# Patient Record
Sex: Male | Born: 1965 | Race: White | Hispanic: No | Marital: Single | Smoking: Never smoker
Health system: Southern US, Community
[De-identification: ages and names within clinical notes are randomized; demographics above are authoritative.]

## PROBLEM LIST (undated history)

## (undated) DIAGNOSIS — E278 Other specified disorders of adrenal gland: Secondary | ICD-10-CM

## (undated) DIAGNOSIS — I10 Essential (primary) hypertension: Secondary | ICD-10-CM

## (undated) DIAGNOSIS — Z87448 Personal history of other diseases of urinary system: Secondary | ICD-10-CM

## (undated) DIAGNOSIS — F32A Depression, unspecified: Secondary | ICD-10-CM

## (undated) DIAGNOSIS — N400 Enlarged prostate without lower urinary tract symptoms: Secondary | ICD-10-CM

## (undated) DIAGNOSIS — T7840XA Allergy, unspecified, initial encounter: Secondary | ICD-10-CM

## (undated) DIAGNOSIS — M109 Gout, unspecified: Secondary | ICD-10-CM

## (undated) DIAGNOSIS — F419 Anxiety disorder, unspecified: Secondary | ICD-10-CM

## (undated) DIAGNOSIS — F329 Major depressive disorder, single episode, unspecified: Secondary | ICD-10-CM

## (undated) DIAGNOSIS — R351 Nocturia: Secondary | ICD-10-CM

## (undated) DIAGNOSIS — E785 Hyperlipidemia, unspecified: Secondary | ICD-10-CM

## (undated) DIAGNOSIS — F1010 Alcohol abuse, uncomplicated: Secondary | ICD-10-CM

## (undated) DIAGNOSIS — K7460 Unspecified cirrhosis of liver: Secondary | ICD-10-CM

## (undated) DIAGNOSIS — D759 Disease of blood and blood-forming organs, unspecified: Secondary | ICD-10-CM

## (undated) HISTORY — DX: Allergy, unspecified, initial encounter: T78.40XA

## (undated) HISTORY — PX: EYE SURGERY: SHX253

## (undated) HISTORY — PX: OTHER SURGICAL HISTORY: SHX169

---
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
## 1987-06-21 HISTORY — PX: KNEE SURGERY: SHX244

## 2003-12-04 ENCOUNTER — Emergency Department (HOSPITAL_COMMUNITY): Admission: EM | Admit: 2003-12-04 | Discharge: 2003-12-04 | Payer: Self-pay | Admitting: Emergency Medicine

## 2007-07-11 ENCOUNTER — Ambulatory Visit: Payer: Self-pay | Admitting: Cardiology

## 2007-08-15 ENCOUNTER — Ambulatory Visit: Payer: Self-pay | Admitting: Cardiology

## 2007-10-15 ENCOUNTER — Ambulatory Visit: Payer: Self-pay | Admitting: Cardiology

## 2008-03-20 ENCOUNTER — Ambulatory Visit (HOSPITAL_COMMUNITY): Payer: Self-pay | Admitting: Psychology

## 2008-04-11 ENCOUNTER — Ambulatory Visit (HOSPITAL_COMMUNITY): Payer: Self-pay | Admitting: Psychology

## 2008-04-25 ENCOUNTER — Ambulatory Visit (HOSPITAL_COMMUNITY): Payer: Self-pay | Admitting: Psychology

## 2008-06-17 ENCOUNTER — Ambulatory Visit (HOSPITAL_COMMUNITY): Payer: Self-pay | Admitting: Psychology

## 2008-07-18 ENCOUNTER — Ambulatory Visit (HOSPITAL_COMMUNITY): Payer: Self-pay | Admitting: Psychology

## 2008-08-22 ENCOUNTER — Ambulatory Visit (HOSPITAL_COMMUNITY): Payer: Self-pay | Admitting: Psychology

## 2008-09-26 ENCOUNTER — Ambulatory Visit (HOSPITAL_COMMUNITY): Payer: Self-pay | Admitting: Psychology

## 2008-10-08 ENCOUNTER — Ambulatory Visit (HOSPITAL_COMMUNITY): Payer: Self-pay | Admitting: Psychology

## 2008-10-24 ENCOUNTER — Ambulatory Visit (HOSPITAL_COMMUNITY): Payer: Self-pay | Admitting: Psychology

## 2008-11-07 ENCOUNTER — Ambulatory Visit (HOSPITAL_COMMUNITY): Payer: Self-pay | Admitting: Psychology

## 2008-12-12 ENCOUNTER — Ambulatory Visit (HOSPITAL_COMMUNITY): Payer: Self-pay | Admitting: Psychology

## 2009-01-30 ENCOUNTER — Ambulatory Visit (HOSPITAL_COMMUNITY): Payer: Self-pay | Admitting: Psychology

## 2009-03-20 ENCOUNTER — Ambulatory Visit (HOSPITAL_COMMUNITY): Payer: Self-pay | Admitting: Psychology

## 2009-07-10 ENCOUNTER — Ambulatory Visit (HOSPITAL_COMMUNITY): Payer: Self-pay | Admitting: Psychology

## 2009-08-07 ENCOUNTER — Ambulatory Visit (HOSPITAL_COMMUNITY): Payer: Self-pay | Admitting: Psychology

## 2009-09-25 ENCOUNTER — Ambulatory Visit (HOSPITAL_COMMUNITY): Payer: Self-pay | Admitting: Psychology

## 2010-09-17 ENCOUNTER — Encounter (INDEPENDENT_AMBULATORY_CARE_PROVIDER_SITE_OTHER): Payer: BC Managed Care – PPO | Admitting: Psychology

## 2010-09-17 DIAGNOSIS — F411 Generalized anxiety disorder: Secondary | ICD-10-CM

## 2010-09-17 DIAGNOSIS — F332 Major depressive disorder, recurrent severe without psychotic features: Secondary | ICD-10-CM

## 2010-10-22 ENCOUNTER — Encounter (INDEPENDENT_AMBULATORY_CARE_PROVIDER_SITE_OTHER): Payer: BC Managed Care – PPO | Admitting: Psychology

## 2010-10-22 DIAGNOSIS — F411 Generalized anxiety disorder: Secondary | ICD-10-CM

## 2010-10-22 DIAGNOSIS — F332 Major depressive disorder, recurrent severe without psychotic features: Secondary | ICD-10-CM

## 2010-11-02 NOTE — Assessment & Plan Note (Signed)
[facility]                          [facility] OFFICE NOTE   CHIRON, CAMPIONE                      MRN:          [id number]  DATE:08/15/2007                            DOB:          05-01-1966    PRIMARY CARDIOLOGIST:  Dr. Lewayne Bunting.   REASON FOR VISIT:  Post hospital follow-up.   Mr. Loveland returns to our clinic after he was seen by [place] in consultation  here at [facility] for evaluation of symptomatic palpitations.  He presented with a prior history of such and had a previous workup in  2005, with a negative event monitor and a normal 2-D echo.   The patient also complained of some associated chest discomfort, which  we felt was quite atypical.  Serial cardiac markers were all within  normal limits.  TSH and electrolytes were normal, as well.   We ordered a 2-D echo, which once again showed normal ventricular  function and no underlying structural abnormalities.  We then prescribed  Lopressor 25 mg p.r.n. for palpitations, and recommended further  evaluation with CardioNet monitoring.   Several rhythm strips have been obtained, all revealing NSR and sinus  tachycardia with rates as high as 139 bpm.  There is no evidence of  dysrhythmia.   Clinically, Mr. Seddon states that he started taking Lopressor on a  daily basis, in the morning, after a particularly bad day where he felt  shaky all day long.  Since taking the Lopressor daily since February  9, he has noted a definite palpable suppression of these palpitations  throughout the day, as well as the associated pressure that he felt in  the chest, particularly in early morning.   The patient denies any exertional angina pectoris.   Electrocardiogram today reveals NSR at 71 bpm, normal axis, no ischemic  changes.   MEDICATIONS:  1. Metoprolol tartrate 25 mg q.a.m.  2. Fish oil 1200 mg daily.  3. Allopurinol 300 mg daily.  4. Citalopram 20 mg daily.  5. Niacin 500 mg  q.h.s.  6. Aspirin 81 mg q.h.s.   PHYSICAL EXAMINATION:  Blood pressure 120/78, pulse 71, regular, weight  157.  GENERAL:  A 45 year old male sitting comfortably in no distress.  HEENT:  Normocephalic, atraumatic.  NECK:  Palpable bilateral carotid pulses without bruits; no JVD.  LUNGS:  Clear to auscultation in all fields.  HEART:  Regular rate and rhythm (S1, S2) no significant murmurs.  No  rubs.  ABDOMEN:  Soft, nontender, intact bowel sounds.  EXTREMITIES:  No pedal edema.  NEURO:  Moderately anxious, but no focal deficit.   IMPRESSION:  1. Longstanding palpitations.      a.     Improved following addition of metoprolol.      b.     Negative Holter monitor in 2005.  2. Normal left ventricular function.  3. Hypertriglyceridemia.  4. Atypical chest discomfort.  5. Anxiety disorder.   PLAN:  Following review with Dr. Andee Lineman, recommendations are as follows:  1. Substitute metoprolol ER 50 mg q.h.s. for more aggressive      management of his documented  sinus tachycardia.  This appears to be      particularly worse in the early morning hours and we will therefore      try to provide coverage for that.  The patient is agreeable with      this plan.  2. A 24-hour urine screen for metanephrines and catecholamines, to      exclude the possibility of pheochromocytoma as possible etiology      for these variations in rate.  Of note, however, the patient has no      formal diagnosis of hypertension.  3. Continue CardioNet monitoring for the full 3 weeks.  4. Schedule return clinic follow-up with myself and Dr. Andee Lineman in 2      months, for  reassessment of clinical status.      Gene Serpe, PA-C  Electronically Signed      Learta Codding, MD,FACC  Electronically Signed   GS/MedQ  DD: 08/15/2007  DT: 08/16/2007  Job #: [id number]   cc:   Fara Chute

## 2010-11-02 NOTE — Assessment & Plan Note (Signed)
[facility]                          [facility]   [name] Brown, [name] Brown                      MRN:          [id number]  DATE:10/15/2007                            DOB:          09-Nov-1965    REFERRING PHYSICIAN:  Fara Chute   REFERRING PHYSICIAN:  Fara Chute, M.D.   HISTORY OF PRESENT ILLNESS:  The patient is a very pleasant 45 year old  male with a history of palpitations and hypertension.  The patient has  been under a significant amount of stress, and he states that this part  of his life has improved.  He also feels now he has less palpitations.  He takes metoprolol p.r.n.  He is also extremely pleased about the  change in his lipid panel after he has cut back on his carbohydrate  intake.  His triglycerides have dropped from 800 grains to 157 now, with  a normal HDL and LDL cholesterol.  The patient denies any chest pain.   MEDICATIONS:  Allopurinol, metoprolol, Zyloprim, multivitamin, omega,  niacin, aspirin.   PHYSICAL EXAMINATION:  VITAL SIGNS:  Blood pressure 116/78, heart rate  93, weight 153 pounds.  NECK:  Normal carotid upstroke.  No carotid bruits.  LUNGS:  Clear breath sounds bilaterally.  HEART:  Regular rate and rhythm.  Normal sinus.  ABDOMEN:  Soft.  EXTREMITIES:  No cyanosis, clubbing, or edema.  NEUROLOGIC:  Patient alert and grossly nonfocal.   PROBLEM LIST:  1. Longstanding palpitations, improved.  2. Normal left ventricular function.  3. Hypertriglyceridemia, resolved.  4. Atypical chest discomfort.  5. Anxiety disorder.   PLAN:  1. The patient is doing extremely well.  His symptoms have all but      resolved.  2. The patient is very pleased with his lipid panel, and this was all      achieved through diet and exercise.   We will see the patient back in 6 months.     Learta Codding, MD,FACC  Electronically Signed    GED/MedQ  DD: 10/15/2007  DT: 10/15/2007  Job #: [id number]   cc:   Fara Chute

## 2010-11-12 ENCOUNTER — Encounter (INDEPENDENT_AMBULATORY_CARE_PROVIDER_SITE_OTHER): Payer: BC Managed Care – PPO | Admitting: Psychology

## 2010-11-12 DIAGNOSIS — F332 Major depressive disorder, recurrent severe without psychotic features: Secondary | ICD-10-CM

## 2010-11-12 DIAGNOSIS — F411 Generalized anxiety disorder: Secondary | ICD-10-CM

## 2010-12-10 ENCOUNTER — Encounter (HOSPITAL_COMMUNITY): Payer: BC Managed Care – PPO | Admitting: Psychology

## 2013-08-08 ENCOUNTER — Ambulatory Visit (INDEPENDENT_AMBULATORY_CARE_PROVIDER_SITE_OTHER): Payer: BC Managed Care – PPO | Admitting: Psychiatry

## 2013-08-08 DIAGNOSIS — F1010 Alcohol abuse, uncomplicated: Secondary | ICD-10-CM | POA: Insufficient documentation

## 2013-08-08 DIAGNOSIS — F1020 Alcohol dependence, uncomplicated: Secondary | ICD-10-CM

## 2013-08-08 NOTE — Progress Notes (Signed)
[name] Brown 48 y.o. 08/08/2013  Pt:  Referred by: Self Referred  PRESENTING PROBLEM Chief Complaint:  Pt is a 48 year old, Caucasian, unmarried, employed, male self-referred for counseling at the request of his employer for the treatment of alcoholism.  Pt states his primary stressor is 1) Alcoholism.  Pt said his drinking has "gotten out of control" and he employer is telling him that he needs to get addiction support or he will lose his job. At the time of session, Pt smelled of alcohol and was brought to session with a designated driver due to inability to drive. Pt struggled to organize his thoughts and required redirection to remain on topic. Pt admitted he had had "one beer" this morning and was not an accurate historian of recent and current alcohols use. Pt said he completed the [facility] outpatient substance abuse program about five years ago and remained sober for 20 months afterwards. Pt said he is also in a "toxic" relationship with his girlfriend of the past four years [name] Brown). Pt said she is a "binge drinker" and he knows the relationship is not healthy for him if he plans to become sober.  What are the main stressors in your life right now, how long?  Alcoholism  PREVIOUS MENTAL  HEALTH SERVICES Have you ever been treated for a mental health problem, when, where, by whom? Dr. Sima Matas for counseling with [facility] in [place] about five years ago. Pt denies any previous psychiatry history and denies any inpatient hospitalizations for mental health or substance abuse related reasons. Pt denies any previous suicide attempts.   RISK FACTORS FOR SUCIDE Demographic factors: Lives with his girlfriend who Pt describes as "toxic". Current mental status: no suicidal ideation  Loss factors: Job stress, relationship stress Historical factors: History of alcoholism and outpatient treatment for alcoholism.  Risk Reduction  factors: No previous or current depression history Clinical factors: Severe alcoholism that is affecting numerous areas of Pts life.   Cognitive features that contribute to risk: Impairment due to substance use.  SUICIDE RISK: Minimal: No identifiable suicidal ideation. Patients presenting with minimal risk factors and may be classified as minimal risk based on no current or past depressive symptoms   MEDICAL HISTORY Medical treatment and/or problems, explain: Gout, Pt is taking medication prescribed for this by his PCP.  SOCIAL/FAMILY HISTORY Have you been married, how many times? 0 Do you have children? 0 Pt lives in a home he purchased in [place] six months ago with his girlfriend of the past four years [name] Brown) and her cousin. Pt said the relationship with his girlfriend is unhealthy.   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  Pt said his supervisor instructed Pt to get help for his drinking in order to keep his job. Pt said he is very supportive of Pts recovery and has helped Pt get support in the past for his drinking. Pt said he is a very good worker when he is sober, but his work has been suffering and co-workers and Forensic psychologist noticed a significant reduction in his job performance as his drinking has increased.   LEGAL HISTORY Pt denies  SUBSTANCE USE Pt was intoxicated at this session and was a poor historian of past and current alcohol consumption. Pt could not tell writer how much he drinks on a daily basis, but said he drinks primarily Vodka and beer. Pt said  he drinks to the point of falling down, slurring words and loss of thought process. Pt had a large gash over his right eye and his eye was black and blue. Pt said he "fell when he was mopping the floor the other day" and admitted he was drinking. Pt said he is also taking "nerve pills" every day that have been  prescribed to his roommate. Pt denied any other substances used.    MENTAL STATUS General Appearance [name] Brown:  intoxicated, cut over right eye and eye bruised Eye Contact: Fair Motor Behavior: Limited due to intoxication Speech: Slurred  Level of Consciousness: Fair   Mood: Mild depressed Affect: limited affect due to intoxication Anxiety Level: none Thought Process: Limited Thought Content: Tangential Perception: Poor Judgment: Poor  Insight: Poor Cognition: Poor  DIAGNOSIS  AXIS I  Alcohol Use Disorder, Severe   303.90   PLAN A substance abuse assessment was completed. Pt was assessed for safety due to intoxication and it was ensured that Pt had a designated driver to take him home (Pts roommate- brought Pt and was in the lobby). Pt was educated on the [facility] substance abuse outpatient Treatment program in [place] and an appointment was made for Pt during this session to begin there on Monday February 23 at 10:30 am with [name] Brown. Writer spoke with Lelon Frohlich to confirm this referral during the session. Pt will attend group same day from 1:00-4:00 pm. Pt agrees to attend. Pt will suspend counseling with writer at this time while completing outpatient substance abuse treatment.     Tresa Res, LCSW 08/08/2013

## 2013-08-12 ENCOUNTER — Encounter (HOSPITAL_COMMUNITY): Payer: Self-pay | Admitting: Emergency Medicine

## 2013-08-12 ENCOUNTER — Inpatient Hospital Stay (HOSPITAL_COMMUNITY)
Admission: EM | Admit: 2013-08-12 | Discharge: 2013-08-14 | DRG: 897 | Disposition: A | Discharge status: Home / Self Care | Payer: BC Managed Care – PPO | Attending: Internal Medicine | Admitting: Internal Medicine

## 2013-08-12 DIAGNOSIS — T510X4A Toxic effect of ethanol, undetermined, initial encounter: Secondary | ICD-10-CM | POA: Diagnosis present

## 2013-08-12 DIAGNOSIS — W19XXXA Unspecified fall, initial encounter: Secondary | ICD-10-CM | POA: Diagnosis present

## 2013-08-12 DIAGNOSIS — D696 Thrombocytopenia, unspecified: Secondary | ICD-10-CM | POA: Diagnosis present

## 2013-08-12 DIAGNOSIS — F10239 Alcohol dependence with withdrawal, unspecified: Principal | ICD-10-CM

## 2013-08-12 DIAGNOSIS — F32A Depression, unspecified: Secondary | ICD-10-CM | POA: Diagnosis present

## 2013-08-12 DIAGNOSIS — R7401 Elevation of levels of liver transaminase levels: Secondary | ICD-10-CM | POA: Diagnosis present

## 2013-08-12 DIAGNOSIS — I498 Other specified cardiac arrhythmias: Secondary | ICD-10-CM | POA: Diagnosis present

## 2013-08-12 DIAGNOSIS — F1210 Cannabis abuse, uncomplicated: Secondary | ICD-10-CM | POA: Diagnosis present

## 2013-08-12 DIAGNOSIS — IMO0002 Reserved for concepts with insufficient information to code with codable children: Secondary | ICD-10-CM | POA: Diagnosis present

## 2013-08-12 DIAGNOSIS — F329 Major depressive disorder, single episode, unspecified: Secondary | ICD-10-CM

## 2013-08-12 DIAGNOSIS — F10939 Alcohol use, unspecified with withdrawal, unspecified: Principal | ICD-10-CM

## 2013-08-12 DIAGNOSIS — F1010 Alcohol abuse, uncomplicated: Secondary | ICD-10-CM

## 2013-08-12 DIAGNOSIS — M109 Gout, unspecified: Secondary | ICD-10-CM

## 2013-08-12 DIAGNOSIS — E876 Hypokalemia: Secondary | ICD-10-CM | POA: Diagnosis present

## 2013-08-12 DIAGNOSIS — Z9181 History of falling: Secondary | ICD-10-CM

## 2013-08-12 DIAGNOSIS — R17 Unspecified jaundice: Secondary | ICD-10-CM | POA: Diagnosis present

## 2013-08-12 DIAGNOSIS — F1020 Alcohol dependence, uncomplicated: Secondary | ICD-10-CM | POA: Diagnosis present

## 2013-08-12 DIAGNOSIS — R791 Abnormal coagulation profile: Secondary | ICD-10-CM | POA: Diagnosis present

## 2013-08-12 DIAGNOSIS — F3289 Other specified depressive episodes: Secondary | ICD-10-CM

## 2013-08-12 DIAGNOSIS — R7309 Other abnormal glucose: Secondary | ICD-10-CM | POA: Diagnosis present

## 2013-08-12 DIAGNOSIS — T65891A Toxic effect of other specified substances, accidental (unintentional), initial encounter: Secondary | ICD-10-CM | POA: Diagnosis present

## 2013-08-12 DIAGNOSIS — R7402 Elevation of levels of lactic acid dehydrogenase (LDH): Secondary | ICD-10-CM | POA: Diagnosis present

## 2013-08-12 DIAGNOSIS — D61818 Other pancytopenia: Secondary | ICD-10-CM | POA: Diagnosis present

## 2013-08-12 DIAGNOSIS — Z79899 Other long term (current) drug therapy: Secondary | ICD-10-CM

## 2013-08-12 DIAGNOSIS — R740 Nonspecific elevation of levels of transaminase and lactic acid dehydrogenase [LDH]: Secondary | ICD-10-CM

## 2013-08-12 HISTORY — DX: Depression, unspecified: F32.A

## 2013-08-12 HISTORY — DX: Major depressive disorder, single episode, unspecified: F32.9

## 2013-08-12 HISTORY — DX: Gout, unspecified: M10.9

## 2013-08-12 LAB — RAPID URINE DRUG SCREEN, HOSP PERFORMED
AMPHETAMINES: NOT DETECTED
BARBITURATES: NOT DETECTED
Benzodiazepines: POSITIVE — AB
Cocaine: NOT DETECTED
Opiates: NOT DETECTED
Tetrahydrocannabinol: NOT DETECTED

## 2013-08-12 LAB — CBC
HCT: 41.4 % (ref 39.0–52.0)
HCT: 37.2 % — ABNORMAL LOW (ref 39.0–52.0)
HEMOGLOBIN: 14.5 g/dL (ref 13.0–17.0)
Hemoglobin: 12.7 g/dL — ABNORMAL LOW (ref 13.0–17.0)
MCH: 33.9 pg (ref 26.0–34.0)
MCH: 33.2 pg (ref 26.0–34.0)
MCHC: 35 g/dL (ref 30.0–36.0)
MCHC: 34.1 g/dL (ref 30.0–36.0)
MCV: 96.7 fL (ref 78.0–100.0)
MCV: 97.4 fL (ref 78.0–100.0)
PLATELETS: 66 10*3/uL — AB (ref 150–400)
PLATELETS: 50 10*3/uL — AB (ref 150–400)
RBC: 4.28 MIL/uL (ref 4.22–5.81)
RBC: 3.82 MIL/uL — AB (ref 4.22–5.81)
RDW: 13.3 % (ref 11.5–15.5)
RDW: 13.4 % (ref 11.5–15.5)
WBC: 5.1 10*3/uL (ref 4.0–10.5)
WBC: 3.3 10*3/uL — AB (ref 4.0–10.5)

## 2013-08-12 LAB — CREATININE, SERUM: CREATININE: 0.69 mg/dL (ref 0.50–1.35)

## 2013-08-12 LAB — COMPREHENSIVE METABOLIC PANEL
ALBUMIN: 3.8 g/dL (ref 3.5–5.2)
ALK PHOS: 137 U/L — AB (ref 39–117)
ALT: 250 U/L — AB (ref 0–53)
AST: 720 U/L — AB (ref 0–37)
BILIRUBIN TOTAL: 5.3 mg/dL — AB (ref 0.3–1.2)
BUN: 6 mg/dL (ref 6–23)
CALCIUM: 8.8 mg/dL (ref 8.4–10.5)
CO2: 26 mEq/L (ref 19–32)
CREATININE: 0.69 mg/dL (ref 0.50–1.35)
Chloride: 90 mEq/L — ABNORMAL LOW (ref 96–112)
GFR calc non Af Amer: >90 mL/min (ref >90)
Glucose, Bld: 135 mg/dL — ABNORMAL HIGH (ref 70–99)
Potassium: 3.6 mEq/L — ABNORMAL LOW (ref 3.7–5.3)
Sodium: 133 mEq/L — ABNORMAL LOW (ref 137–147)
Total Protein: 7.8 g/dL (ref 6.0–8.3)

## 2013-08-12 LAB — ETHANOL

## 2013-08-12 LAB — TSH: TSH: 1.141 u[IU]/mL (ref 0.350–4.500)

## 2013-08-12 LAB — I-STAT TROPONIN, ED: TROPONIN I, POC: 0 ng/mL (ref 0.00–0.08)

## 2013-08-12 LAB — SALICYLATE LEVEL: Salicylate Lvl: <2 mg/dL — ABNORMAL LOW (ref 2.8–20.0)

## 2013-08-12 LAB — ACETAMINOPHEN LEVEL: Acetaminophen (Tylenol), Serum: <15 ug/mL (ref 10–30)

## 2013-08-12 MED ORDER — FOLIC ACID 1 MG PO TABS
1.0000 mg | ORAL_TABLET | Freq: Every day | ORAL | Status: DC
  Administered 2013-08-12 – 2013-08-14 (×3): 1 mg via ORAL
  Filled 2013-08-12 (×4): qty 1

## 2013-08-12 MED ORDER — ENOXAPARIN SODIUM 40 MG/0.4ML ~~LOC~~ SOLN
40.0000 mg | SUBCUTANEOUS | Status: DC
  Administered 2013-08-12 – 2013-08-13 (×2): 40 mg via SUBCUTANEOUS
  Filled 2013-08-12 (×4): qty 0.4

## 2013-08-12 MED ORDER — ADULT MULTIVITAMIN W/MINERALS CH
1.0000 | ORAL_TABLET | Freq: Every day | ORAL | Status: DC
  Administered 2013-08-12 – 2013-08-14 (×3): 1 via ORAL
  Filled 2013-08-12 (×3): qty 1

## 2013-08-12 MED ORDER — HYDROCODONE-ACETAMINOPHEN 5-325 MG PO TABS: 1.0000 | ORAL_TABLET | ORAL | Status: DC | PRN

## 2013-08-12 MED ORDER — ALLOPURINOL 300 MG PO TABS
300.0000 mg | ORAL_TABLET | Freq: Every day | ORAL | Status: DC
  Administered 2013-08-13 – 2013-08-14 (×2): 300 mg via ORAL
  Filled 2013-08-12 (×2): qty 1

## 2013-08-12 MED ORDER — SODIUM CHLORIDE 0.9 % IV SOLN
INTRAVENOUS | Status: DC
  Administered 2013-08-12 – 2013-08-13 (×4): via INTRAVENOUS

## 2013-08-12 MED ORDER — OMEGA-3-ACID ETHYL ESTERS 1 G PO CAPS
1.0000 g | ORAL_CAPSULE | Freq: Every day | ORAL | Status: DC
  Administered 2013-08-12 – 2013-08-14 (×3): 1 g via ORAL
  Filled 2013-08-12 (×3): qty 1

## 2013-08-12 MED ORDER — ONDANSETRON HCL 4 MG/2ML IJ SOLN: 4.0000 mg | Freq: Four times a day (QID) | INTRAMUSCULAR | Status: DC | PRN

## 2013-08-12 MED ORDER — CITALOPRAM HYDROBROMIDE 20 MG PO TABS
20.0000 mg | ORAL_TABLET | Freq: Every day | ORAL | Status: DC
  Administered 2013-08-13 – 2013-08-14 (×2): 20 mg via ORAL
  Filled 2013-08-12 (×2): qty 1

## 2013-08-12 MED ORDER — LORAZEPAM 1 MG PO TABS: 0.0000 mg | ORAL_TABLET | Freq: Two times a day (BID) | ORAL | Status: DC

## 2013-08-12 MED ORDER — ONDANSETRON HCL 4 MG PO TABS: 4.0000 mg | ORAL_TABLET | Freq: Four times a day (QID) | ORAL | Status: DC | PRN

## 2013-08-12 MED ORDER — SODIUM CHLORIDE 0.9 % IV BOLUS (SEPSIS)
1000.0000 mL | Freq: Once | INTRAVENOUS | Status: AC
  Administered 2013-08-12: 1000 mL via INTRAVENOUS

## 2013-08-12 MED ORDER — ACETAMINOPHEN 325 MG PO TABS
650.0000 mg | ORAL_TABLET | Freq: Four times a day (QID) | ORAL | Status: DC | PRN
  Administered 2013-08-12: 650 mg via ORAL
  Filled 2013-08-12: qty 2

## 2013-08-12 MED ORDER — LORAZEPAM 1 MG PO TABS: 0.0000 mg | ORAL_TABLET | Freq: Four times a day (QID) | ORAL | Status: DC

## 2013-08-12 MED ORDER — SODIUM CHLORIDE 0.9 % IJ SOLN
3.0000 mL | Freq: Two times a day (BID) | INTRAMUSCULAR | Status: DC
  Administered 2013-08-13: 3 mL via INTRAVENOUS

## 2013-08-12 MED ORDER — VITAMIN B-1 100 MG PO TABS
100.0000 mg | ORAL_TABLET | Freq: Every day | ORAL | Status: DC
  Administered 2013-08-13 – 2013-08-14 (×2): 100 mg via ORAL
  Filled 2013-08-12 (×2): qty 1

## 2013-08-12 MED ORDER — LORAZEPAM 2 MG/ML IJ SOLN
0.0000 mg | Freq: Two times a day (BID) | INTRAMUSCULAR | Status: DC
  Filled 2013-08-12: qty 1

## 2013-08-12 MED ORDER — ACETAMINOPHEN 650 MG RE SUPP: 650.0000 mg | Freq: Four times a day (QID) | RECTAL | Status: DC | PRN

## 2013-08-12 MED ORDER — PNEUMOCOCCAL VAC POLYVALENT 25 MCG/0.5ML IJ INJ
0.5000 mL | INJECTION | INTRAMUSCULAR | Status: AC
  Administered 2013-08-13: 0.5 mL via INTRAMUSCULAR
  Filled 2013-08-12 (×2): qty 0.5

## 2013-08-12 MED ORDER — HYDROMORPHONE HCL PF 1 MG/ML IJ SOLN
1.0000 mg | INTRAMUSCULAR | Status: DC | PRN
  Administered 2013-08-12: 1 mg via INTRAVENOUS
  Filled 2013-08-12: qty 1

## 2013-08-12 MED ORDER — ALUM & MAG HYDROXIDE-SIMETH 200-200-20 MG/5ML PO SUSP: 30.0000 mL | Freq: Four times a day (QID) | ORAL | Status: DC | PRN

## 2013-08-12 MED ORDER — LORAZEPAM 2 MG/ML IJ SOLN
0.0000 mg | Freq: Four times a day (QID) | INTRAMUSCULAR | Status: DC
  Administered 2013-08-12: 2 mg via INTRAVENOUS
  Administered 2013-08-12: 1 mg via INTRAVENOUS
  Administered 2013-08-12: 2 mg via INTRAVENOUS
  Administered 2013-08-13: 1 mg via INTRAVENOUS
  Administered 2013-08-13: 2 mg via INTRAVENOUS
  Filled 2013-08-12 (×4): qty 1

## 2013-08-12 MED ORDER — SODIUM CHLORIDE 0.9 % IV SOLN
INTRAVENOUS | Status: AC
  Administered 2013-08-12: 17:00:00 via INTRAVENOUS

## 2013-08-12 MED ORDER — THIAMINE HCL 100 MG/ML IJ SOLN
100.0000 mg | Freq: Every day | INTRAMUSCULAR | Status: DC
  Administered 2013-08-12: 100 mg via INTRAVENOUS
  Filled 2013-08-12 (×2): qty 1
  Filled 2013-08-12: qty 2

## 2013-08-12 NOTE — Progress Notes (Signed)
Utilization Review completed.  Gabriella Guile RN CM  

## 2013-08-12 NOTE — Progress Notes (Signed)
   CARE MANAGEMENT ED NOTE 08/12/2013  Patient:  [name] Brown, [name] Brown   Account Number:  [id number]  Date Initiated:  08/12/2013  Documentation initiated by:  Livia Snellen  Subjective/Objective Assessment:   Patient presents to Ed sent from [facility] with alcohol withdraw symptoms.     Subjective/Objective Assessment Detail:   Patient with pmhx of ETOH, depression and gout.  CIWA score in ED 11.  HR 118     Action/Plan:   IV fluids, and ativan IV given in the ED   Action/Plan Detail:   Patient to be admitted.   Anticipated DC Date:       Status Recommendation to Physician:   Result of Recommendation:    Other ED [place]  Other  PCP issues    Choice offered to / List presented to:            Status of service:  Completed, signed off  ED Comments:   ED Comments Detail:  EDCM spoke to patient at bedside.  As per patient, he is from [place], [place].  Patient reports he went to the hospital in [place] yesterday where they provided him a list of doctors who accept [employer].  Patient reports that his insurance coverage may change in March. Instructed patient to call the phone number on the back of his insurance card or go to insurance company website to help him find a doctor who is close to hime and within network.  Patient verbalized understanding.

## 2013-08-12 NOTE — ED Notes (Signed)
Bed: WA09 Expected date:  Expected time:  Means of arrival:  Comments: Hold triage 2

## 2013-08-12 NOTE — ED Notes (Addendum)
Pt denies SI/HI, AH/VH. Pt reports ETOH abuse >20 years. Tried detox 6 years ago. For last 6 years pt has daily drank 6 beers and 9 shots of vodka. Pt binge drinks after work. Last ETOH on Saturday morning. Pt did try to drink a beer yesterday, but got hiccups and stopped. Pt reports chest discomfort/palpitations 5/10. Reports some SOB. Headache pain 4/10. Pt has never had seizures from withdrawal before.   pts goal is to do outpatient therapy at [facility]. Pt went to [facility] saw Susa Loffler (who runs outpatient program), and took pt straight to ED.

## 2013-08-12 NOTE — ED Provider Notes (Signed)
CSN: [id number]     Arrival date & time 08/12/13  1144 History   First MD Initiated Contact with Patient 08/12/13 1310     Chief Complaint  Patient presents with  . ETOH withdrawal   . Medical Clearance     (Consider location/radiation/quality/duration/timing/severity/associated sxs/prior Treatment) The history is provided by the patient.  [name] Brown is a 48 y.o. male hx of depression here with alcohol withdrawal. Patient drinks alcohol daily. He drinks a fifth of vodka and a dozen beers daily. Last drink was 2 days ago. Patient has been doing worse at his job recently. He want to go to detox and went to [place] several days ago. Was referred to go to rehabilitation today. Went to alcohol rehabilitation center across the street and was sent here because his heart rate was 130s and he was in active withdrawal. His initial CIWA was 11. No history of withdrawal seizures. He has palpitations currently and tremors. Denies hallucinations.    Past Medical History  Diagnosis Date  . Gout   . Depression    History reviewed. No pertinent past surgical history. History reviewed. No pertinent family history. History  Substance Use Topics  . Smoking status: Not on file  . Smokeless tobacco: Not on file  . Alcohol Use: Not on file    Review of Systems  Cardiovascular: Positive for palpitations.  All other systems reviewed and are negative.      Allergies  Review of patient's allergies indicates no known allergies.  Home Medications   Current Outpatient Rx  Name  Route  Sig  Dispense  Refill  . allopurinol (ZYLOPRIM) 300 MG tablet   Oral   Take 300 mg by mouth daily.         . citalopram (CELEXA) 20 MG tablet   Oral   Take 20 mg by mouth daily.         . Multiple Vitamin (MULTIVITAMIN WITH MINERALS) TABS tablet   Oral   Take 1 tablet by mouth daily.         Marland Kitchen omega-3 acid ethyl esters (LOVAZA) 1 G capsule   Oral   Take 1 g by mouth daily.         . Red  Yeast Rice 600 MG CAPS   Oral   Take 1 capsule by mouth daily.          BP 125/83  Pulse 102  Temp(Src) 98.6 F (37 C) (Oral)  Resp 19  SpO2 97% Physical Exam  Nursing note and vitals reviewed. Constitutional: He is oriented to person, place, and time.  Disheveled   HENT:  Head: Normocephalic.  Mouth/Throat: Oropharynx is clear and moist.  Eyes: Conjunctivae and EOM are normal. Pupils are equal, round, and reactive to light.  Neck: Normal range of motion. Neck supple.  Cardiovascular: Regular rhythm and normal heart sounds.   Tachycardic   Pulmonary/Chest: Effort normal and breath sounds normal. No respiratory distress. He has no wheezes. He has no rales.  Abdominal: Soft. Bowel sounds are normal. He exhibits no distension. There is no tenderness. There is no rebound.  Musculoskeletal: Normal range of motion.  + bilateral tremors   Neurological: He is alert and oriented to person, place, and time.  Nl strength throughout, + diffuse tremors   Skin: Skin is warm and dry.  Psychiatric: He has a normal mood and affect. His behavior is normal. Judgment and thought content normal.    ED Course  Procedures (including critical care time)  Labs Review Labs Reviewed  CBC - Abnormal; Notable for the following:    Platelets 66 (*)    All other components within normal limits  COMPREHENSIVE METABOLIC PANEL - Abnormal; Notable for the following:    Sodium 133 (*)    Potassium 3.6 (*)    Chloride 90 (*)    Glucose, Bld 135 (*)    AST 720 (*)    ALT 250 (*)    Alkaline Phosphatase 137 (*)    Total Bilirubin 5.3 (*)    All other components within normal limits  SALICYLATE LEVEL - Abnormal; Notable for the following:    Salicylate Lvl <3.6 (*)    All other components within normal limits  ACETAMINOPHEN LEVEL  ETHANOL  URINE RAPID DRUG SCREEN (HOSP PERFORMED)  I-STAT TROPOININ, ED   Imaging Review No results found.  EKG Interpretation   None       MDM   Final  diagnoses:  None   [name] Brown is a 48 y.o. male here with palpitations. He is in alcohol withdrawal. Will start on CIWA protocol. Will likely need admission.   3:07 PM AST elevated, likely from alcohol. No abdominal tenderness. Will admit to tele for alcohol withdrawal.      Wandra Arthurs, MD 08/12/13 [phone]

## 2013-08-12 NOTE — H&P (Signed)
History and Physical       Hospital Admission Note Date: 08/12/2013  Patient name: [name] Brown Medical record number: [id number] Date of birth: April 28, 1966 Age: 48 y.o. Gender: male  PCP: No primary provider on file.    Chief Complaint:  In alcohol withdrawals  HPI: Patient is a 48 year old male with history of depression, gout was sent from outpatient [facility] due to acute alcohol withdrawal symptoms. History was obtained from the patient who reported that he drinks alcohol significantly, fifth of vodka daily and 3-10 beers daily. On Saturday, 2 days ago, patient stopped drinking on his own and started self detoxing. Once he started getting the alcohol withdrawal symptoms, shaking, he tried to drink one beer yesterday on Sunday. However he felt very nauseous after that. This morning, he came to outpatient [facility] and met with Ms Susa Loffler, who runs the outpatient detox program. Patient was noticed to have palpitations with chest discomfort, tachycardia with a heart rate of 135 and BP 170/110.  Patient reports that he has not had any alcohol related withdrawal seizures, had a DUI last year. Chest tightness has resolved with improvement in his heart rate and BP at this time. Patient has multiple falls in the last week from alcohol and has multiple abrasions on his extremities and above right eyebrow. Patient was recommended inpatient detox until stable enough to be possibly admitted to [facility].    Review of Systems:  Constitutional: Denies fever, chills, diaphoresis, poor appetite and fatigue.  HEENT: Denies photophobia, eye pain, redness, hearing loss, ear pain, congestion, sore throat, rhinorrhea, sneezing, mouth sores, trouble swallowing, neck pain, neck stiffness and tinnitus.   Respiratory: Denies SOB, DOE, cough,+ chest tightness, no wheezing   Cardiovascular: Denies chest pain, palpitations and leg swelling.  Gastrointestinal: + nausea, no  vomiting, abdominal pain, diarrhea, constipation, blood in stool and abdominal distention.  Genitourinary: Denies dysuria, urgency, frequency, hematuria, flank pain and difficulty urinating.  Musculoskeletal: Denies myalgias, back pain, joint swelling, arthralgias and gait problem.  Skin: Denies pallor, rash and wound.  Neurological: Denies dizziness, seizures, syncope, weakness, light-headedness, numbness and headaches. + tremulous Hematological: Denies adenopathy. Easy bruising, personal or family bleeding history  Psychiatric/Behavioral: Please see history of present illness  Past Medical History: Past Medical History  Diagnosis Date  . Gout   . Depression    Past Surgical History  Procedure Laterality Date  . No past surgeries      Medications: Prior to Admission medications   Medication Sig Start Date End Date Taking? Authorizing Provider  allopurinol (ZYLOPRIM) 300 MG tablet Take 300 mg by mouth daily.   Yes Historical Provider, MD  citalopram (CELEXA) 20 MG tablet Take 20 mg by mouth daily.   Yes Historical Provider, MD  Multiple Vitamin (MULTIVITAMIN WITH MINERALS) TABS tablet Take 1 tablet by mouth daily.   Yes Historical Provider, MD  omega-3 acid ethyl esters (LOVAZA) 1 G capsule Take 1 g by mouth daily.   Yes Historical Provider, MD  Red Yeast Rice 600 MG CAPS Take 1 capsule by mouth daily.   Yes Historical Provider, MD    Allergies:  No Known Allergies  Social History:  reports that he has never smoked. He has never used smokeless tobacco. He reports that he drinks alcohol significantly, please see history of present illness. He reports that he uses illicit drugs (Marijuana).  Family History: History reviewed. No pertinent family history.  Physical Exam: Blood pressure 122/82, pulse 102, temperature 98.6 F (37 C), temperature source Oral,  resp. rate 19, SpO2 97.00%. General: Alert, awake, oriented x3, in no acute distress, and jittery, flushed face HEENT:  normocephalic, atraumatic, anicteric sclera, pink conjunctiva, pupils equal and reactive to light and accomodation, oropharynx clear Neck: supple, no masses or lymphadenopathy, no goiter, no bruits  Heart: tachycardia, Regular rate and rhythm, without murmurs, rubs or gallops. Lungs: Clear to auscultation bilaterally, no wheezing, rales or rhonchi. Abdomen: Soft, nontender, nondistended, positive bowel sounds, no masses. Extremities: No clubbing, cyanosis or edema with positive pedal pulses. tremulous Neuro: Grossly intact, no focal neurological deficits, strength 5/5 upper and lower extremities bilaterally Psych: alert and oriented x 3, very anxious Skin: He has multiple abrasions on his extremities, forehead   LABS on Admission:  Basic Metabolic Panel:  Recent Labs Lab 08/12/13 1304  NA 133*  K 3.6*  CL 90*  CO2 26  GLUCOSE 135*  BUN 6  CREATININE 0.69  CALCIUM 8.8   Liver Function Tests:  Recent Labs Lab 08/12/13 1304  AST 720*  ALT 250*  ALKPHOS 137*  BILITOT 5.3*  PROT 7.8  ALBUMIN 3.8   No results found for this basename: LIPASE, AMYLASE,  in the last 168 hours No results found for this basename: AMMONIA,  in the last 168 hours CBC:  Recent Labs Lab 08/12/13 1304  WBC 5.1  HGB 14.5  HCT 41.4  MCV 96.7  PLT 66*   Cardiac Enzymes: No results found for this basename: CKTOTAL, CKMB, CKMBINDEX, TROPONINI,  in the last 168 hours BNP: No components found with this basename: POCBNP,  CBG: No results found for this basename: GLUCAP,  in the last 168 hours   Radiological Exams on Admission: No results found.  Assessment/Plan Principal Problem:   Alcohol withdrawal - Patient will be admitted for inpatient detox, placed on CIWA protocol with Ativan,  - Placed on IV fluids, thiamine, folic acid, multivitamin - Check urine drug screen  Active Problems:   Alcohol abuse - Patient counseled strongly on alcohol cessation, he will need inpatient [facility] once he  has completed detox    Depression - Continue Celexa    Gout - Currently stable   Multiple falls - Likely due to alcohol, will completed detox and then start physical therapy for evaluation  DVT prophylaxis: Lovenox   CODE STATUS: Full code   Family Communication: Admission, patients condition and plan of care including tests being ordered have been discussed with the patient who indicates understanding and agree with the plan and Code Status   Further plan will depend as patient's clinical course evolves and further radiologic and laboratory data become available.   Time Spent on Admission: 1 hour  Bastion Bolger M.D. [facility] 08/12/2013, 3:48 PM Pager: [phone]  If 7PM-7AM, please contact night-coverage [email or web address] Password [email or web address]

## 2013-08-13 LAB — CBC
HCT: 36.8 % — ABNORMAL LOW (ref 39.0–52.0)
HEMOGLOBIN: 12.5 g/dL — AB (ref 13.0–17.0)
MCH: 33.5 pg (ref 26.0–34.0)
MCHC: 34 g/dL (ref 30.0–36.0)
MCV: 98.7 fL (ref 78.0–100.0)
Platelets: 36 10*3/uL — ABNORMAL LOW (ref 150–400)
RBC: 3.73 MIL/uL — ABNORMAL LOW (ref 4.22–5.81)
RDW: 13.4 % (ref 11.5–15.5)
WBC: 2.8 10*3/uL — AB (ref 4.0–10.5)

## 2013-08-13 LAB — COMPREHENSIVE METABOLIC PANEL
ALT: 160 U/L — AB (ref 0–53)
AST: 385 U/L — ABNORMAL HIGH (ref 0–37)
Albumin: 2.8 g/dL — ABNORMAL LOW (ref 3.5–5.2)
Alkaline Phosphatase: 110 U/L (ref 39–117)
BUN: 6 mg/dL (ref 6–23)
CO2: 28 mEq/L (ref 19–32)
Calcium: 8 mg/dL — ABNORMAL LOW (ref 8.4–10.5)
Chloride: 98 mEq/L (ref 96–112)
Creatinine, Ser: 0.69 mg/dL (ref 0.50–1.35)
GFR calc Af Amer: >90 mL/min (ref >90)
GFR calc non Af Amer: >90 mL/min (ref >90)
Glucose, Bld: 104 mg/dL — ABNORMAL HIGH (ref 70–99)
POTASSIUM: 3.5 meq/L — AB (ref 3.7–5.3)
SODIUM: 135 meq/L — AB (ref 137–147)
TOTAL PROTEIN: 5.9 g/dL — AB (ref 6.0–8.3)
Total Bilirubin: 2.8 mg/dL — ABNORMAL HIGH (ref 0.3–1.2)

## 2013-08-13 MED ORDER — LORAZEPAM 2 MG/ML IJ SOLN
0.0000 mg | INTRAMUSCULAR | Status: DC
  Administered 2013-08-13: 1 mg via INTRAVENOUS
  Administered 2013-08-13: 2 mg via INTRAVENOUS
  Administered 2013-08-14: 1 mg via INTRAVENOUS
  Filled 2013-08-13 (×3): qty 1

## 2013-08-13 MED ORDER — METOPROLOL SUCCINATE 12.5 MG HALF TABLET
12.5000 mg | ORAL_TABLET | Freq: Every day | ORAL | Status: DC
  Administered 2013-08-13 – 2013-08-14 (×2): 12.5 mg via ORAL
  Filled 2013-08-13 (×2): qty 1

## 2013-08-13 MED ORDER — LORAZEPAM 2 MG/ML IJ SOLN: 0.5000 mg | INTRAMUSCULAR | Status: DC | PRN

## 2013-08-13 MED ORDER — POTASSIUM CHLORIDE CRYS ER 20 MEQ PO TBCR
20.0000 meq | EXTENDED_RELEASE_TABLET | Freq: Every day | ORAL | Status: DC
  Administered 2013-08-13 – 2013-08-14 (×2): 20 meq via ORAL
  Filled 2013-08-13 (×2): qty 1

## 2013-08-13 MED ORDER — LORAZEPAM 2 MG/ML IJ SOLN: 0.0000 mg | INTRAMUSCULAR | Status: DC

## 2013-08-13 NOTE — Progress Notes (Signed)
Note: This document was prepared with digital dictation and possible smart phrase technology. Any transcriptional errors that result from this process are unintentional.   [name] [name] CBJ:[id number] DOB: 09/29/1965 DOA: 08/12/2013 PCP: No primary provider on file.  Brief narrative: 48 y/o ?, known depression on Celexa, gout, falls, h/o palpitations [negative event monitor, normal 2-D echo 2005-was on metoprolol]  Past medical history-As per Problem list Chart reviewed as below- See above  Consultants:  Psychiatry  Procedures:  None  Antibiotics:  None   Subjective  Feels a little jittery Tolerating diet fairly only Tired and does not feel well No fever no chills no nausea no vomiting States that he never needs an eye opener States that over the past couple of months his drinking has increased Has not taken any blood pressure medications recently in the past 6 years for his history of palpitation   Objective    Interim History: None  Telemetry: Sinus tachycardia PVCs 130s   Objective: Filed Vitals:   08/12/13 1706 08/12/13 1853 08/13/13 0013 08/13/13 0655  BP: 125/79 126/85 120/74 124/83  Pulse: 84 98 82 62  Temp:  98.7 F (37.1 C) 98.2 F (36.8 C) 97.9 F (36.6 C)  TempSrc:  Oral Oral Oral  Resp:  18 20 20   Height:  5\' 8"  (1.727 m)    Weight:  71.8 kg (158 lb 4.6 oz)    SpO2:  97% 99% 99%    Intake/Output Summary (Last 24 hours) at 08/13/13 1113 Last data filed at 08/13/13 0525  Gross per 24 hour  Intake 1236.66 ml  Output      0 ml  Net 1236.66 ml    Exam:  General: EOMI, NCAT Cardiovascular: S1-S2 no murmur rub or gallop Respiratory: Clinically clear Abdomen: Soft nontender nondistended Skin afebrile Neuro grossly intact  Data Reviewed: Basic Metabolic Panel:  Recent Labs Lab 08/12/13 1304 08/12/13 2102 08/13/13 0410  NA 133*  --  135*  K 3.6*  --  3.5*  CL 90*  --  98  CO2 26  --  28  GLUCOSE 135*  --  104*  BUN 6  --  6   CREATININE 0.69 0.69 0.69  CALCIUM 8.8  --  8.0*   Liver Function Tests:  Recent Labs Lab 08/12/13 1304 08/13/13 0410  AST 720* 385*  ALT 250* 160*  ALKPHOS 137* 110  BILITOT 5.3* 2.8*  PROT 7.8 5.9*  ALBUMIN 3.8 2.8*   No results found for this basename: LIPASE, AMYLASE,  in the last 168 hours No results found for this basename: AMMONIA,  in the last 168 hours CBC:  Recent Labs Lab 08/12/13 1304 08/12/13 2102 08/13/13 0410  WBC 5.1 3.3* 2.8*  HGB 14.5 12.7* 12.5*  HCT 41.4 37.2* 36.8*  MCV 96.7 97.4 98.7  PLT 66* 50* 36*   Cardiac Enzymes: No results found for this basename: CKTOTAL, CKMB, CKMBINDEX, TROPONINI,  in the last 168 hours BNP: No components found with this basename: POCBNP,  CBG: No results found for this basename: GLUCAP,  in the last 168 hours  No results found for this or any previous visit (from the past 240 hour(s)).   Studies:              All Imaging reviewed and is as per above notation   Scheduled Meds: . allopurinol  300 mg Oral Daily  . citalopram  20 mg Oral Daily  . enoxaparin (LOVENOX) injection  40 mg Subcutaneous Q24H  .  folic acid  1 mg Oral Daily  . LORazepam  0-4 mg Intravenous 4 times per day  . LORazepam  0-4 mg Oral 4 times per day  . LORazepam  0-4 mg Oral Q12H  . multivitamin with minerals  1 tablet Oral Daily  . omega-3 acid ethyl esters  1 g Oral Daily  . sodium chloride  3 mL Intravenous Q12H  . thiamine  100 mg Intravenous Daily  . thiamine  100 mg Oral Daily   Continuous Infusions: . sodium chloride 100 mL/hr at 08/13/13 0525     Assessment/Plan: 1. Acute alcohol withdrawal with risk for DTs-Ciwa 15 this morning.  Continue Ativan protocol for now.  Recommended to nursing to recheck every 4 hours. If patient decompensates higher level of care   2. Sinus tachycardia-chronic -institute Toprol-XL 12.5 daily monitor on telemetry  3.  history of gout-continue allopurinol 300 daily 4. History of bipolar-continue  citalopram 20 daily 5. Relative pancytopenia-secondary to toxic effect of alcohol. Monitor CBC a.m. 6. Impaired glucose tolerance-monitor. Get HbA1c if blood sugars go above 200 7. Coagulopathy-secondary to chronic alcoholism 8. Hypokalemia-replace orally Kdur 20 daily  Code Status: Full Family Communication: none present.it was thought it  Disposition Plan: inpatient   Verneita Griffes, MD  [facility] Pager [phone] 08/13/2013, 11:13 AM    LOS: 1 day

## 2013-08-13 NOTE — Consult Note (Signed)
[name] [name]   Reason for [name]:  Alcohol dependence Referring Physician:  Dr [name] [name] is an 48 y.o. male. Total Time spent with patient: 30 minutes  Assessment: AXIS I:  Substance Abuse and Alcohol dependence AXIS II:  Deferred AXIS III:   Past Medical History  Diagnosis Date  . Gout   . Depression    AXIS IV:  other psychosocial or environmental problems and problems related to social environment AXIS V:  51-60 moderate symptoms  Plan:  Supportive therapy provided about ongoing stressors. Discussed crisis plan, support from social network, calling 911, coming to the Emergency Department, and calling Suicide Hotline. Recommended [facility] IOP program.  Continue detox treatment  Subjective:   [name] [name] is a 48 y.o. male patient admitted with alcohol withdrawal.  HPI:  Patient seen chart reviewed.  Patient is 48 year old man who had long history of alcohol abuse and dependency.  He was recommended from outpatient behavioral Center because of chest palpitations, tachycardia and he was feeling very nauseous.  Patient admitted drinking vodka and 3-10 beers every day.  He admitted to recently his drinking has been increased.  He endorsed increased anxiety, poor sleep, having issues with a girlfriend who has history of binge drinking.  His girlfriend is currently admitted to the hospital for alcohol problem.  Patient works in a [employer] for more than 17 years and recently he has been recommended by his employer to get treatment.  In the past he has treatment at [facility] in going to the [place] meeting but he admitted not followup regularly.  Patient denies any other substance use.  Denies any suicidal thinking or any homicidal thoughts.  He denies any hallucinations or any paranoia.  Patient is concerned about losing his job.  In the past he has seen Dr [name] [name] for counseling.  He denies any history of inpatient psychiatric treatment, suicidal  attempt, paranoia or any hallucinations.  He has tremors and shakes.  His alcohol level was less than 11 but his liver enzymes are moderately high.  Patient is getting better on Ativan protocol.  Patient is on Celexa which is prescribed by his primary care physician. HPI Elements:   Location:  Alcohol use. Quality:  On the daily basis. Severity:  Moderate.  Past Psychiatric History: Past Medical History  Diagnosis Date  . Gout   . Depression     reports that he has never smoked. He has never used smokeless tobacco. He reports that he drinks alcohol. He reports that he uses illicit drugs (Marijuana). History reviewed. No pertinent family history.   Living Arrangements: Spouse/significant other (girlfriend and girlfriend's cousin)   Abuse/Neglect [facility]) Physical Abuse: Denies Verbal Abuse: Denies Sexual Abuse: Denies Allergies:  No Known Allergies  ACT Assessment Complete:  Yes:    Educational Status    Risk to Self: Risk to self Is patient at risk for suicide?: No Substance abuse history and/or treatment for substance abuse?: Yes  Risk to Others:    Abuse: Abuse/Neglect Assessment (Assessment to be complete while patient is alone) Physical Abuse: Denies Verbal Abuse: Denies Sexual Abuse: Denies Exploitation of patient/patient's resources: Denies Self-Neglect: Denies  Prior Inpatient Therapy:    Prior Outpatient Therapy:    Additional Information:                    Objective: Blood pressure 141/85, pulse 95, temperature 98.2 F (36.8 C), temperature source Oral, resp. rate 18, height '5\' 8"'  (1.727 m),  weight 158 lb 4.6 oz (71.8 kg), SpO2 98.00%.Body mass index is 24.07 kg/(m^2). Results for orders placed during the hospital encounter of 08/12/13 (from the past 72 hour(s))  ACETAMINOPHEN LEVEL     Status: None   Collection Time    08/12/13  1:04 PM      Result Value Ref Range   Acetaminophen (Tylenol), Serum <15.0  10 - 30 ug/mL   Comment:             THERAPEUTIC CONCENTRATIONS VARY     SIGNIFICANTLY. A RANGE OF 10-30     ug/mL MAY BE AN EFFECTIVE     CONCENTRATION FOR MANY PATIENTS.     HOWEVER, SOME ARE BEST TREATED     AT CONCENTRATIONS OUTSIDE THIS     RANGE.     ACETAMINOPHEN CONCENTRATIONS     >150 ug/mL AT 4 HOURS AFTER     INGESTION AND >50 ug/mL AT 12     HOURS AFTER INGESTION ARE     OFTEN ASSOCIATED WITH TOXIC     REACTIONS.  CBC     Status: Abnormal   Collection Time    08/12/13  1:04 PM      Result Value Ref Range   WBC 5.1  4.0 - 10.5 K/uL   RBC 4.28  4.22 - 5.81 MIL/uL   Hemoglobin 14.5  13.0 - 17.0 g/dL   HCT 41.4  39.0 - 52.0 %   MCV 96.7  78.0 - 100.0 fL   MCH 33.9  26.0 - 34.0 pg   MCHC 35.0  30.0 - 36.0 g/dL   RDW 13.3  11.5 - 15.5 %   Platelets 66 (*) 150 - 400 K/uL   Comment: PLATELET COUNT CONFIRMED BY SMEAR  COMPREHENSIVE METABOLIC PANEL     Status: Abnormal   Collection Time    08/12/13  1:04 PM      Result Value Ref Range   Sodium 133 (*) 137 - 147 mEq/L   Potassium 3.6 (*) 3.7 - 5.3 mEq/L   Chloride 90 (*) 96 - 112 mEq/L   CO2 26  19 - 32 mEq/L   Glucose, Bld 135 (*) 70 - 99 mg/dL   BUN 6  6 - 23 mg/dL   Creatinine, Ser 0.69  0.50 - 1.35 mg/dL   Calcium 8.8  8.4 - 10.5 mg/dL   Total Protein 7.8  6.0 - 8.3 g/dL   Albumin 3.8  3.5 - 5.2 g/dL   AST 720 (*) 0 - 37 U/L   ALT 250 (*) 0 - 53 U/L   Alkaline Phosphatase 137 (*) 39 - 117 U/L   Total Bilirubin 5.3 (*) 0.3 - 1.2 mg/dL   GFR calc non Af Amer >90  >90 mL/min   GFR calc Af Amer >90  >90 mL/min   Comment: (NOTE)     The eGFR has been calculated using the CKD EPI equation.     This calculation has not been validated in all clinical situations.     eGFR's persistently <90 mL/min signify possible Chronic Kidney     Disease.  ETHANOL     Status: None   Collection Time    08/12/13  1:04 PM      Result Value Ref Range   Alcohol, Ethyl (B) <11  0 - 11 mg/dL   Comment:            LOWEST DETECTABLE LIMIT FOR     SERUM ALCOHOL IS 11  mg/dL  FOR MEDICAL PURPOSES ONLY  SALICYLATE LEVEL     Status: Abnormal   Collection Time    08/12/13  1:04 PM      Result Value Ref Range   Salicylate Lvl <3.8 (*) 2.8 - 20.0 mg/dL  TSH     Status: None   Collection Time    08/12/13  1:05 PM      Result Value Ref Range   TSH 1.141  0.350 - 4.500 uIU/mL   Comment: Performed at [facility], ED     Status: None   Collection Time    08/12/13  1:13 PM      Result Value Ref Range   Troponin i, poc 0.00  0.00 - 0.08 ng/mL   Comment 3            Comment: Due to the release kinetics of cTnI,     a negative result within the first hours     of the onset of symptoms does not rule out     myocardial infarction with certainty.     If myocardial infarction is still suspected,     repeat the test at appropriate intervals.  URINE RAPID DRUG SCREEN (HOSP PERFORMED)     Status: Abnormal   Collection Time    08/12/13  4:43 PM      Result Value Ref Range   Opiates NONE DETECTED  NONE DETECTED   Cocaine NONE DETECTED  NONE DETECTED   Benzodiazepines POSITIVE (*) NONE DETECTED   Amphetamines NONE DETECTED  NONE DETECTED   Tetrahydrocannabinol NONE DETECTED  NONE DETECTED   Barbiturates NONE DETECTED  NONE DETECTED   Comment:            DRUG SCREEN FOR MEDICAL PURPOSES     ONLY.  IF CONFIRMATION IS NEEDED     FOR ANY PURPOSE, NOTIFY LAB     WITHIN 5 DAYS.                LOWEST DETECTABLE LIMITS     FOR URINE DRUG SCREEN     Drug Class       Cutoff (ng/mL)     Amphetamine      1000     Barbiturate      200     Benzodiazepine   937     Tricyclics       342     Opiates          300     Cocaine          300     THC              50  CBC     Status: Abnormal   Collection Time    08/12/13  9:02 PM      Result Value Ref Range   WBC 3.3 (*) 4.0 - 10.5 K/uL   RBC 3.82 (*) 4.22 - 5.81 MIL/uL   Hemoglobin 12.7 (*) 13.0 - 17.0 g/dL   HCT 37.2 (*) 39.0 - 52.0 %   MCV 97.4  78.0 - 100.0 fL   MCH 33.2  26.0 - 34.0  pg   MCHC 34.1  30.0 - 36.0 g/dL   RDW 13.4  11.5 - 15.5 %   Platelets 50 (*) 150 - 400 K/uL   Comment: DELTA CHECK NOTED     REPEATED TO VERIFY     SPECIMEN CHECKED FOR CLOTS     PLATELET COUNT CONFIRMED BY SMEAR  CREATININE, SERUM     Status: None   Collection Time    08/12/13  9:02 PM      Result Value Ref Range   Creatinine, Ser 0.69  0.50 - 1.35 mg/dL   GFR calc non Af Amer >90  >90 mL/min   GFR calc Af Amer >90  >90 mL/min   Comment: (NOTE)     The eGFR has been calculated using the CKD EPI equation.     This calculation has not been validated in all clinical situations.     eGFR's persistently <90 mL/min signify possible Chronic Kidney     Disease.  CBC     Status: Abnormal   Collection Time    08/13/13  4:10 AM      Result Value Ref Range   WBC 2.8 (*) 4.0 - 10.5 K/uL   RBC 3.73 (*) 4.22 - 5.81 MIL/uL   Hemoglobin 12.5 (*) 13.0 - 17.0 g/dL   HCT 36.8 (*) 39.0 - 52.0 %   MCV 98.7  78.0 - 100.0 fL   MCH 33.5  26.0 - 34.0 pg   MCHC 34.0  30.0 - 36.0 g/dL   RDW 13.4  11.5 - 15.5 %   Platelets 36 (*) 150 - 400 K/uL   Comment: REPEATED TO VERIFY     DELTA CHECK NOTED     PLATELET COUNT CONFIRMED BY SMEAR     SPECIMEN CHECKED FOR CLOTS  COMPREHENSIVE METABOLIC PANEL     Status: Abnormal   Collection Time    08/13/13  4:10 AM      Result Value Ref Range   Sodium 135 (*) 137 - 147 mEq/L   Potassium 3.5 (*) 3.7 - 5.3 mEq/L   Chloride 98  96 - 112 mEq/L   Comment: DELTA CHECK NOTED     REPEATED TO VERIFY   CO2 28  19 - 32 mEq/L   Glucose, Bld 104 (*) 70 - 99 mg/dL   BUN 6  6 - 23 mg/dL   Creatinine, Ser 0.69  0.50 - 1.35 mg/dL   Calcium 8.0 (*) 8.4 - 10.5 mg/dL   Total Protein 5.9 (*) 6.0 - 8.3 g/dL   Albumin 2.8 (*) 3.5 - 5.2 g/dL   AST 385 (*) 0 - 37 U/L   ALT 160 (*) 0 - 53 U/L   Alkaline Phosphatase 110  39 - 117 U/L   Total Bilirubin 2.8 (*) 0.3 - 1.2 mg/dL   GFR calc non Af Amer >90  >90 mL/min   GFR calc Af Amer >90  >90 mL/min   Comment: (NOTE)     The  eGFR has been calculated using the CKD EPI equation.     This calculation has not been validated in all clinical situations.     eGFR's persistently <90 mL/min signify possible Chronic Kidney     Disease.   Labs are reviewed and are pertinent for highly enzymes.  Current Facility-Administered Medications  Medication Dose Route Frequency Provider Last Rate Last Dose  . 0.9 %  sodium chloride infusion   Intravenous Continuous Nita Sells, MD 50 mL/hr at 08/13/13 1449 50 mL/hr at 08/13/13 1449  . acetaminophen (TYLENOL) tablet 650 mg  650 mg Oral Q6H PRN Ripudeep Krystal Eaton, MD   650 mg at 08/12/13 2103   Or  . acetaminophen (TYLENOL) suppository 650 mg  650 mg Rectal Q6H PRN Ripudeep K Rai, MD      . allopurinol (ZYLOPRIM) tablet 300 mg  300 mg  Oral Daily Ripudeep Krystal Eaton, MD   300 mg at 08/13/13 1034  . alum & mag hydroxide-simeth (MAALOX/MYLANTA) 200-200-20 MG/5ML suspension 30 mL  30 mL Oral Q6H PRN Ripudeep K Rai, MD      . citalopram (CELEXA) tablet 20 mg  20 mg Oral Daily Ripudeep K Rai, MD   20 mg at 08/13/13 1034  . enoxaparin (LOVENOX) injection 40 mg  40 mg Subcutaneous Q24H Ripudeep Krystal Eaton, MD   40 mg at 08/12/13 2112  . folic acid (FOLVITE) tablet 1 mg  1 mg Oral Daily Ripudeep K Rai, MD   1 mg at 08/13/13 1034  . HYDROcodone-acetaminophen (NORCO/VICODIN) 5-325 MG per tablet 1 tablet  1 tablet Oral Q4H PRN Ripudeep K Rai, MD      . HYDROmorphone (DILAUDID) injection 1 mg  1 mg Intravenous Q4H PRN Ripudeep Krystal Eaton, MD   1 mg at 08/12/13 2103  . LORazepam (ATIVAN) injection 0-4 mg  0-4 mg Intravenous Q4H Nita Sells, MD      . LORazepam (ATIVAN) tablet 0-4 mg  0-4 mg Oral 4 times per day Wandra Arthurs, MD      . LORazepam (ATIVAN) tablet 0-4 mg  0-4 mg Oral Q12H Wandra Arthurs, MD      . metoprolol succinate (TOPROL-XL) 24 hr tablet 12.5 mg  12.5 mg Oral Daily Nita Sells, MD   12.5 mg at 08/13/13 1447  . multivitamin with minerals tablet 1 tablet  1 tablet Oral Daily  Ripudeep Krystal Eaton, MD   1 tablet at 08/13/13 1034  . omega-3 acid ethyl esters (LOVAZA) capsule 1 g  1 g Oral Daily Ripudeep K Rai, MD   1 g at 08/13/13 1034  . ondansetron (ZOFRAN) tablet 4 mg  4 mg Oral Q6H PRN Ripudeep K Rai, MD       Or  . ondansetron (ZOFRAN) injection 4 mg  4 mg Intravenous Q6H PRN Ripudeep K Rai, MD      . potassium chloride SA (K-DUR,KLOR-CON) CR tablet 20 mEq  20 mEq Oral Daily Nita Sells, MD   20 mEq at 08/13/13 1449  . sodium chloride 0.9 % injection 3 mL  3 mL Intravenous Q12H Ripudeep K Rai, MD      . thiamine (B-1) injection 100 mg  100 mg Intravenous Daily Wandra Arthurs, MD   100 mg at 08/12/13 1349  . thiamine (VITAMIN B-1) tablet 100 mg  100 mg Oral Daily Wandra Arthurs, MD   100 mg at 08/13/13 1034    Psychiatric Specialty Exam:     Blood pressure 141/85, pulse 95, temperature 98.2 F (36.8 C), temperature source Oral, resp. rate 18, height '5\' 8"'  (1.727 m), weight 158 lb 4.6 oz (71.8 kg), SpO2 98.00%.Body mass index is 24.07 kg/(m^2).  General Appearance: Casual  Eye Contact::  Fair  Speech:  Slow  Volume:  Decreased  Mood:  Anxious and Depressed  Affect:  Constricted  Thought Process:  Logical  Orientation:  Full (Time, Place, and Person)  Thought Content:  Rumination  Suicidal Thoughts:  No  Homicidal Thoughts:  No  Memory:  Immediate;   Fair Recent;   Fair Remote;   Fair  Judgement:  Fair  Insight:  Fair  Psychomotor Activity:  Tremor  Concentration:  Fair  Recall:  [employer] of Knowledge:Fair  Language: Fair  Akathisia:  No  Handed:  Right  AIMS (if indicated):     Assets:  Communication Skills Desire for Improvement  Housing Intimacy Social Support  Sleep:      Musculoskeletal: Strength & Muscle Tone: within normal limits Gait & Station: normal Patient leans: N/A  Treatment Plan Summary: Medication management , continue Ativan protocol.  Patient is getting better.  Patient was offered for voluntary inpatient treatment for  detox however patient needs more time to make a decision.  He is more interested in [facility] IOP program.  Patient does not meet criteria for involuntary commitment at this time.  Patient can be referred to [facility] IOP program for his alcohol abuse if he decided not to be admitted for detox treatment.  Please call [phone] if you've any further questions.   ARFEEN,SYED T. 08/13/2013 3:55 PM

## 2013-08-13 NOTE — Progress Notes (Signed)
Clinical Social Work Department CLINICAL SOCIAL WORK PSYCHIATRY SERVICE LINE ASSESSMENT 08/13/2013  Patient:  [name] [name]  Account:  [id number]  Admit Date:  08/12/2013  Clinical Social Worker:  Sindy Messing, LCSW  Date/Time:  08/13/2013 04:30 PM Referred by:  Physician  Date referred:  08/13/2013 Reason for Referral  Substance Abuse   Presenting Symptoms/Problems (In the person's/family's own words):   Psych consulted due to substance abuse.   Abuse/Neglect/Trauma History (check all that apply)  Denies history   Abuse/Neglect/Trauma Comments:   Psychiatric History (check all that apply)  Outpatient treatment   Psychiatric medications:  Celexa 20 mg   Current Mental Health Hospitalizations/Previous Mental Health History:   Patient reports that he felt overwhelmed and saw a psychologist for therapy about 5 years ago. Patient believes that PCP prescribes medications for depression and feels it is beneficial. Patient has also received treatment for intensive outpatient.   Current provider:   PCP   Place and Date:   Current Medications:   Scheduled Meds:      . allopurinol  300 mg Oral Daily  . citalopram  20 mg Oral Daily  . enoxaparin (LOVENOX) injection  40 mg Subcutaneous Q24H  . folic acid  1 mg Oral Daily  . LORazepam  0-4 mg Intravenous Q4H  . LORazepam  0-4 mg Oral 4 times per day  . LORazepam  0-4 mg Oral Q12H  . metoprolol succinate  12.5 mg Oral Daily  . multivitamin with minerals  1 tablet Oral Daily  . omega-3 acid ethyl esters  1 g Oral Daily  . potassium chloride  20 mEq Oral Daily  . sodium chloride  3 mL Intravenous Q12H  . thiamine  100 mg Intravenous Daily  . thiamine  100 mg Oral Daily        Continuous Infusions:      . sodium chloride 50 mL/hr (08/13/13 1449)          PRN Meds:.acetaminophen, acetaminophen, alum & mag hydroxide-simeth, HYDROcodone-acetaminophen, HYDROmorphone (DILAUDID) injection, ondansetron (ZOFRAN) IV, ondansetron        Previous Impatient Admission/Date/Reason:   [facility] about 5-6 years ago for CDIOP.   Emotional Health / Current Symptoms    Suicide/Self Harm  None reported   Suicide attempt in the past:   Patient denies any current SI or HI. Patient denies any previous suicide attempts.   Other harmful behavior:   None reported   Psychotic/Dissociative Symptoms  None reported   Other Psychotic/Dissociative Symptoms:   N/A    Attention/Behavioral Symptoms  Within Normal Limits   Other Attention / Behavioral Symptoms:   Patient engaged in assessment.    Cognitive Impairment  Within Normal Limits   Other Cognitive Impairment:   Patient alert and oriented.    Mood and Adjustment  Mood Congruent    Stress, Anxiety, Trauma, Any Recent Loss/Stressor  None reported   Anxiety (frequency):   N/A   Phobia (specify):   N/A   Compulsive behavior (specify):   N/A   Obsessive behavior (specify):   N/A   Other:   N/A   Substance Abuse/Use  Current substance use   SBIRT completed (please refer for detailed history):  Y  Self-reported substance use:   Patient reports he is drinking alcohol on a daily basis and consumption varies from a few shots to a fifth of vodka. Patient reports that he has not drank since Friday or Saturday but was unable to complete detox by himself and felt he needed  medical attention.   Urinary Drug Screen Completed:  Y Alcohol level:   <11    Environmental/Housing/Living Arrangement  Stable housing   Who is in the home:   Girlfriend   Emergency contact:  [place]   Patient's Strengths and Goals (patient's own words):   Patient reports he has been sober in the past and wants to remain sober. Patient reports that parents are very supportive of plans and believe that treatment is needed.   Clinical Social Worker's Interpretive Summary:   CSW received referral in order to complete psychosocial  assessment. CSW reviewed chart which stated that psych MD recommends CD IOP at [facility]. CSW met with patient at bedside. CSW introduced myself and explained role.    Patient reports that he was at [facility] in order to complete assessment for CD IOP and [facility] felt that patient needed medical attention so he was sent to ED for evaluation. Patient reports that he wanted treatment in order to remain sober.    Patient currently lives with girlfriend and reports that his alcohol use was interfering with his job. Patient's employer encouraged patient to seek treatment and is agreeable to allow him to have time off during the day to attend CD IOP. Patient reports that he has limited support but that parents feel that patient needs treatment.    Patient started drinking when he was 13 years ago and feels that he became addicted around 48 years old. Patient reports that he wanted to get sober and tried to detox at home. Patient has not drank since Friday or Saturday but reports feeling ill due to detoxing at home. Patient reports that he went to [facility] about 5-6 years ago and followed up with AA meetings in order to be sober for about 20 months. Patient reports he is unsure what triggered him to start drinking again but reports he and girlfriend drank on New Year's Eve and have been drinking ever since.    Patient agreeable to complete SBIRT and reports that he is interested in returning to CD IOP at [facility]. Patient is aware of process and reports he can call [facility] to schedule follow up appointment for CD IOP. Patient reports that girlfriend is drinking and CSW encouraged patient to discuss healthy boundaries in CD IOP to determine if she will be a trigger to drink alcohol again. Patient identifies triggers such as financial stress, employment stress, and feeling the need to drink with girlfriend. Patient plans to complete detox at hospital and then return to work while attending CD IOP during the day.    Patient denies any  AH or VH. Patient denies any SI or HI. Patient is engaged in assessment and reports he is relieved to be "back on track" to remaining sober. Patient has appropriate affect and mood and is agreeable to contact Brandon Melnick at [facility] at [place].    Psych CSW is signing off but available if any further needs arise.   Disposition:  Outpatient referral made/needed  [place], [place] [phone]

## 2013-08-14 DIAGNOSIS — R17 Unspecified jaundice: Secondary | ICD-10-CM | POA: Diagnosis present

## 2013-08-14 DIAGNOSIS — R7401 Elevation of levels of liver transaminase levels: Secondary | ICD-10-CM | POA: Diagnosis present

## 2013-08-14 DIAGNOSIS — D696 Thrombocytopenia, unspecified: Secondary | ICD-10-CM | POA: Diagnosis present

## 2013-08-14 DIAGNOSIS — R740 Nonspecific elevation of levels of transaminase and lactic acid dehydrogenase [LDH]: Secondary | ICD-10-CM

## 2013-08-14 LAB — COMPREHENSIVE METABOLIC PANEL
ALBUMIN: 3.2 g/dL — AB (ref 3.5–5.2)
ALT: 132 U/L — AB (ref 0–53)
AST: 249 U/L — ABNORMAL HIGH (ref 0–37)
Alkaline Phosphatase: 114 U/L (ref 39–117)
BUN: 7 mg/dL (ref 6–23)
CALCIUM: 8.7 mg/dL (ref 8.4–10.5)
CO2: 28 mEq/L (ref 19–32)
Chloride: 96 mEq/L (ref 96–112)
Creatinine, Ser: 0.81 mg/dL (ref 0.50–1.35)
GFR calc Af Amer: >90 mL/min (ref >90)
GFR calc non Af Amer: >90 mL/min (ref >90)
Glucose, Bld: 98 mg/dL (ref 70–99)
Potassium: 3.2 mEq/L — ABNORMAL LOW (ref 3.7–5.3)
SODIUM: 138 meq/L (ref 137–147)
TOTAL PROTEIN: 6.8 g/dL (ref 6.0–8.3)
Total Bilirubin: 2.5 mg/dL — ABNORMAL HIGH (ref 0.3–1.2)

## 2013-08-14 LAB — CBC
HEMATOCRIT: 40.5 % (ref 39.0–52.0)
HEMOGLOBIN: 13.5 g/dL (ref 13.0–17.0)
MCH: 33.1 pg (ref 26.0–34.0)
MCHC: 33.3 g/dL (ref 30.0–36.0)
MCV: 99.3 fL (ref 78.0–100.0)
Platelets: 47 10*3/uL — ABNORMAL LOW (ref 150–400)
RBC: 4.08 MIL/uL — ABNORMAL LOW (ref 4.22–5.81)
RDW: 13.5 % (ref 11.5–15.5)
WBC: 4 10*3/uL (ref 4.0–10.5)

## 2013-08-14 MED ORDER — POTASSIUM CHLORIDE CRYS ER 20 MEQ PO TBCR
40.0000 meq | EXTENDED_RELEASE_TABLET | Freq: Once | ORAL | Status: AC
  Administered 2013-08-14: 40 meq via ORAL
  Filled 2013-08-14: qty 2

## 2013-08-14 MED ORDER — METOPROLOL SUCCINATE ER 25 MG PO TB24: 12.5000 mg | ORAL_TABLET | Freq: Every day | ORAL | Status: DC

## 2013-08-14 MED ORDER — LORAZEPAM 1 MG PO TABS: 1.0000 mg | ORAL_TABLET | Freq: Four times a day (QID) | ORAL | Status: DC | PRN

## 2013-08-14 NOTE — Discharge Summary (Addendum)
Physician Discharge Summary  [name] Brown GYB:[id number] DOB: Nov 13, 1965 DOA: 08/12/2013  PCP: No primary provider on file.  Admit date: 08/12/2013 Discharge date: 08/14/2013  Time spent: 35 minutes  Recommendations for Outpatient Follow-up:  1. Follow up with PCP in 1 week 2. Follow up with psychiatry as an outpatient   Discharge Diagnoses:  Principal Problem:   Alcohol withdrawal Active Problems:   Alcohol abuse   Depression   Gout   Thrombocytopenia, unspecified   Transaminitis   Total bilirubin, elevated  Discharge Condition: stable  Diet recommendation: regular  Filed Weights   08/12/13 1853  Weight: 71.8 kg (158 lb 4.6 oz)   History of present illness:  Patient is a 48 year old male with history of depression, gout was sent from outpatient [facility] due to acute alcohol withdrawal symptoms. History was obtained from the patient who reported that he drinks alcohol significantly, fifth of vodka daily and 3-10 beers daily. On Saturday, 2 days ago, patient stopped drinking on his own and started self detoxing. Once he started getting the alcohol withdrawal symptoms, shaking, he tried to drink one beer yesterday on Sunday. However he felt very nauseous after that. This morning, he came to outpatient [facility] and met with Ms Susa Loffler, who runs the outpatient detox program. Patient was noticed to have palpitations with chest discomfort, tachycardia with a heart rate of 135 and BP 170/110. Patient reports that he has not had any alcohol related withdrawal seizures, had a DUI last year. Chest tightness has resolved with improvement in his heart rate and BP at this time. Patient has multiple falls in the last week from alcohol and has multiple abrasions on his extremities and above right eyebrow.  Patient was recommended inpatient detox until stable enough to be possibly admitted to [facility].   Hospital Course:  Alcohol withdrawal - patient with significant improvement in his withdrawal symptoms  today. His last drink was more than 4 days ago and he looks clinically stable for discharge today. He is very determined to continue to remain abstinent and plans to follow up with psychiatry as an outpatient. Psychiatry and psychiatry SW evaluated patient while hospitalized, appreciated input.  Sinus tach - chronic, more so exacerbated by withdrawal, continue Metoprolol. Gout - continue home medications Bipolar - continue home medications Thrombocytopenia - no evidence of bleeding, likely due to ETOH abuse. Trending up.  LFT elevation - in the setting of ETOH use, improving. T bili improving as well.   Procedures:  none  Consultations:  Psychiatry   Discharge Exam: Filed Vitals:   08/13/13 0655 08/13/13 1512 08/14/13 0027 08/14/13 0624  BP: 124/83 141/85 126/84 139/84  Pulse: 62 95 89 86  Temp: 97.9 F (36.6 C) 98.2 F (36.8 C) 98.3 F (36.8 C) 98.3 F (36.8 C)  TempSrc: Oral Oral Oral Oral  Resp: '20 18 18 18  ' Height:      Weight:      SpO2: 99% 98% 99% 99%   General: NAD Cardiovascular: RRR Respiratory: CTA biL  Discharge Instructions    Medication List         allopurinol 300 MG tablet  Commonly known as:  ZYLOPRIM  Take 300 mg by mouth daily.     citalopram 20 MG tablet  Commonly known as:  CELEXA  Take 20 mg by mouth daily.     LORazepam 1 MG tablet  Commonly known as:  ATIVAN  Take 1 tablet (1 mg total) by mouth every 6 (six) hours as needed for  anxiety.     metoprolol succinate 25 MG 24 hr tablet  Commonly known as:  TOPROL XL  Take 0.5 tablets (12.5 mg total) by mouth daily.     multivitamin with minerals Tabs tablet  Take 1 tablet by mouth daily.     omega-3 acid ethyl esters 1 G capsule  Commonly known as:  LOVAZA  Take 1 g by mouth daily.     Red Yeast Rice 600 MG Caps  Take 1 capsule by mouth daily.       Follow-up Information   Follow up with PCP. Schedule an appointment as soon as possible for a visit in 1 week.      The results  of significant diagnostics from this hospitalization (including imaging, microbiology, ancillary and laboratory) are listed below for reference.    Labs: Basic Metabolic Panel:  Recent Labs Lab 08/12/13 1304 08/12/13 2102 08/13/13 0410 08/14/13 0435  NA 133*  --  135* 138  K 3.6*  --  3.5* 3.2*  CL 90*  --  98 96  CO2 26  --  28 28  GLUCOSE 135*  --  104* 98  BUN 6  --  6 7  CREATININE 0.69 0.69 0.69 0.81  CALCIUM 8.8  --  8.0* 8.7   Liver Function Tests:  Recent Labs Lab 08/12/13 1304 08/13/13 0410 08/14/13 0435  AST 720* 385* 249*  ALT 250* 160* 132*  ALKPHOS 137* 110 114  BILITOT 5.3* 2.8* 2.5*  PROT 7.8 5.9* 6.8  ALBUMIN 3.8 2.8* 3.2*   CBC:  Recent Labs Lab 08/12/13 1304 08/12/13 2102 08/13/13 0410 08/14/13 0435  WBC 5.1 3.3* 2.8* 4.0  HGB 14.5 12.7* 12.5* 13.5  HCT 41.4 37.2* 36.8* 40.5  MCV 96.7 97.4 98.7 99.3  PLT 66* 50* 36* 47*   Signed:  GHERGHE, COSTIN  [facility] 08/14/2013, 6:11 PM

## 2013-08-14 NOTE — Discharge Instructions (Signed)
You were cared for by a hospitalist during your hospital stay. If you have any questions about your discharge medications or the care you received while you were in the hospital after you are discharged, you can call the unit and asked to speak with the hospitalist on call if the hospitalist that took care of you is not available. Once you are discharged, your primary care physician will handle any further medical issues. Please note that NO REFILLS for any discharge medications will be authorized once you are discharged, as it is imperative that you return to your primary care physician (or establish a relationship with a primary care physician if you do not have one) for your aftercare needs so that they can reassess your need for medications and monitor your lab values. °  °  °If you do not have a primary care physician, you can call [phone] for a physician referral. ° °Follow with Primary MD in 5-7 days  ° °Get CBC, CMP checked by your doctor and again as further instructed.  °Get a 2 view Chest X ray done next visit if you had Pneumonia of Lung problems at the Hospital. ° °Get Medicines reviewed and adjusted. ° °Please request your Prim.MD to go over all Hospital Tests and Procedure/Radiological results at the follow up, please get all Hospital records sent to your Prim MD by signing hospital release before you go home. ° °Activity: As tolerated with Full fall precautions use walker/cane & assistance as needed ° °Diet: regular ° °For Heart failure patients - Check your Weight same time everyday, if you gain over 2 pounds, or you develop in leg swelling, experience more shortness of breath or chest pain, call your Primary MD immediately. Follow Cardiac Low Salt Diet and 1.8 lit/day fluid restriction. ° °Disposition Home ° °If you experience worsening of your admission symptoms, develop shortness of breath, life threatening emergency, suicidal or homicidal thoughts you must seek medical attention immediately by  calling 911 or calling your MD immediately  if symptoms less severe. ° °You Must read complete instructions/literature along with all the possible adverse reactions/side effects for all the Medicines you take and that have been prescribed to you. Take any new Medicines after you have completely understood and accpet all the possible adverse reactions/side effects.  ° °Do not drive and provide baby sitting services if your were admitted for syncope or siezures until you have seen by Primary MD or a Neurologist and advised to do so again. ° °Do not drive when taking Pain medications.  ° °Do not take more than prescribed Pain, Sleep and Anxiety Medications ° °Special Instructions: If you have smoked or chewed Tobacco  in the last 2 yrs please stop smoking, stop any regular Alcohol  and or any Recreational drug use. ° °Wear Seat belts while driving. ° °

## 2013-08-19 ENCOUNTER — Other Ambulatory Visit (HOSPITAL_COMMUNITY): Payer: BC Managed Care – PPO | Attending: Psychiatry | Admitting: Psychology

## 2013-08-19 ENCOUNTER — Encounter (HOSPITAL_COMMUNITY): Payer: Self-pay | Admitting: Psychology

## 2013-08-19 DIAGNOSIS — F10939 Alcohol use, unspecified with withdrawal, unspecified: Secondary | ICD-10-CM

## 2013-08-19 DIAGNOSIS — F10230 Alcohol dependence with withdrawal, uncomplicated: Secondary | ICD-10-CM

## 2013-08-19 DIAGNOSIS — F10239 Alcohol dependence with withdrawal, unspecified: Secondary | ICD-10-CM

## 2013-08-19 DIAGNOSIS — F1020 Alcohol dependence, uncomplicated: Secondary | ICD-10-CM | POA: Insufficient documentation

## 2013-08-19 DIAGNOSIS — F329 Major depressive disorder, single episode, unspecified: Secondary | ICD-10-CM | POA: Insufficient documentation

## 2013-08-19 DIAGNOSIS — F3289 Other specified depressive episodes: Secondary | ICD-10-CM | POA: Insufficient documentation

## 2013-08-19 DIAGNOSIS — F1994 Other psychoactive substance use, unspecified with psychoactive substance-induced mood disorder: Secondary | ICD-10-CM | POA: Insufficient documentation

## 2013-08-20 ENCOUNTER — Encounter (HOSPITAL_COMMUNITY): Payer: Self-pay | Admitting: Psychology

## 2013-08-20 NOTE — Progress Notes (Signed)
    Daily Group Progress Note  Program: CD-IOP   Group Time: 1-2:30 pm  Participation Level: Active  Behavioral Response: Appropriate and Sharing  Type of Therapy: Process Group  Topic: Group Process: The first part of group was spent in process. Members shared about the past weekend and any issues or concerns that came up in early recovery. A new group member was present and he disclosed some of his history and recent detox at [facility]. There was good feedback and disclosure among the group. Also present today was a Counselling psychologist from [employer]. She introduced herself and engaged in the discussion.   Group Time: 2:45- 4pm  Participation Level: Active  Behavioral Response: Appropriate  Type of Therapy: Psycho-education Group  Topic: Bio-Psycho-Social- Spiritual: the second half of group was spent in a psycho-ed on the 4 elements that contribute to the formation of an addiction. Each of the 4 elements was discussed in detail. Of these 4, it was emphasized that only the biological cannot be changed. Each of the remaining 3 elements can be directly addressed in many different ways. Members identified what they intend to do to address and change the 3 elements that they can change. During this half of group, the PA shared some of her story and described her father as an alcoholic who denies the damage his drinking has caused to the family as well as to his own health.   Summary: The patient was new to the group today. The patient shared that he had met with me a week ago for his orientation, but he was in withdrawal from alcohol and I had persuaded him to go to the [facility] ED. He had been hospitalized for 2 days. Today he admitted that he felt much better. Other members assured him that they had been through that same experience and it was rough. The patient reported he had been at his home since then and although there was alcohol there, someone drank it all and now his  house is alcohol-free. He shared some of his story and noted he had been in the outpatient program at [facility] about 7 years ago. He had managed to remain sober for 20 months, but began drinking again. His employer had encouraged him to seek help back then and was doing it again now. The patient admitted he had never really gotten into the [facility] and never had a sponsor. In the second half of group, the patient identified the addiction as being on the maternal side of his family with his mother's father being alcoholic. The patient was open and received warmly today. He admitted his anxiety is really uncomfortable and he welcomed the opportunity to meet with the program director on Friday. He responded well to this first group session and his sobriety date is 2/22.    Family Program: Family present? No   Name of family member(s):   UDS collected: No Results:  AA/NA attended?: No, patient is new to group and just discharged from detox late last week  Sponsor?: No   Kali Ambler, LCAS

## 2013-08-21 ENCOUNTER — Other Ambulatory Visit (HOSPITAL_COMMUNITY): Payer: BC Managed Care – PPO

## 2013-08-21 DIAGNOSIS — F1020 Alcohol dependence, uncomplicated: Secondary | ICD-10-CM

## 2013-08-23 ENCOUNTER — Other Ambulatory Visit (HOSPITAL_COMMUNITY): Payer: BC Managed Care – PPO | Admitting: Psychology

## 2013-08-23 DIAGNOSIS — E559 Vitamin D deficiency, unspecified: Secondary | ICD-10-CM

## 2013-08-23 DIAGNOSIS — F1020 Alcohol dependence, uncomplicated: Secondary | ICD-10-CM

## 2013-08-23 DIAGNOSIS — F1994 Other psychoactive substance use, unspecified with psychoactive substance-induced mood disorder: Secondary | ICD-10-CM | POA: Insufficient documentation

## 2013-08-23 MED ORDER — TRAZODONE HCL 50 MG PO TABS: 50.0000 mg | ORAL_TABLET | Freq: Every evening | ORAL | Status: DC | PRN

## 2013-08-23 MED ORDER — B COMPLEX-C PO TABS: 1.0000 | ORAL_TABLET | Freq: Every day | ORAL | Status: DC

## 2013-08-23 NOTE — Progress Notes (Signed)
Psychiatric Assessment Adult  Patient Identification:  [name] Brown Date of Evaluation:  08/23/2013 Chief Complaint: "ALCOHOL" History of Chief Complaint:  Pt reports took first drink age 48/14 "[employer]" .Began drinking regularly age 81. First sensed LOC age 64.Been daily drinker since age 28. Pt admitted to [facility] hospital with impending DTs 08/12/13. After 20 month period of sobriety 4-5 yrs ago at the warning of his employer, pt  began drinking beer only.This past November he began to drink whiskey and his bingedrinking girlfriend started drinking with him as well.They consumed "at least" 1/5 and 12 pack workdays and more on weekends. Work which tolerated his drinking to a point told him he needed (for the second time) to do something about his drinking or he would be "out the door".He came to [facility] IOP on referral from his counselor and at intake she noted his severe tremors /withdrawal and requested he see MD in ED . He agreed and as noted was admitted to Medical Floor for 2 days and sent home with additional days of Ativan.He returned for intake and CDIOP today  HPI Location:Pt seen at [facility]         Duration:Chronic ongoing alcoholism for past 8 years         Preciptating:Alcohol abuse starting at age 51         Severity: alcoholic liver disease.appears to have some cirrhosis by labs Review of Systems  Constitutional: Positive for fatigue and unexpected weight change (8lbs after last 2 months). Negative for fever, chills, diaphoresis, activity change and appetite change.  HENT: Positive for congestion. Negative for dental problem, hearing loss, nosebleeds and sinus pressure.   Eyes: Positive for visual disturbance (lasix x 1 -close up vision /presbyopia). Negative for photophobia, pain, discharge, redness and itching.  Respiratory: Negative for apnea, choking, chest tightness, shortness of breath, wheezing and stridor.   Cardiovascular: Negative for chest pain,  palpitations and leg swelling.  Gastrointestinal: Negative for nausea, vomiting, abdominal pain, diarrhea, constipation, blood in stool, abdominal distention, anal bleeding and rectal pain.  Genitourinary: Positive for hematuria (on recent UA). Negative for dysuria, urgency, decreased urine volume, discharge, penile swelling, scrotal swelling, enuresis, penile pain and testicular pain.  Musculoskeletal: Positive for back pain (injured 1989). Negative for gait problem, joint swelling, myalgias, neck pain and neck stiffness.       Hx of falls from drinking with swelling and bruising lt buttocks and hip( area  Skin: Positive for color change (lt glutael hematoma resolving). Negative for pallor, rash and wound.  Allergic/Immunologic: Negative.  Negative for environmental allergies, food allergies and immunocompromised state.  Neurological: Negative for dizziness, tremors, seizures, syncope, facial asymmetry, speech difficulty, weakness, light-headedness, numbness and headaches.  Hematological: Negative for adenopathy. Does not bruise/bleed easily.  Psychiatric/Behavioral: Positive for behavioral problems (alcoholism), sleep disturbance and agitation (w/d from etoh). Negative for suicidal ideas, hallucinations, confusion, self-injury, dysphoric mood and decreased concentration. The patient is not nervous/anxious and is not hyperactive.    Physical Exam  Constitutional: He is oriented to person, place, and time. He appears well-developed and well-nourished.  Plethoric  HENT:  Head: Normocephalic and atraumatic.  Right Ear: External ear normal.  Left Ear: External ear normal.  Nose: Nose normal.  Mouth/Throat: Oropharynx is clear and moist.  Eyes: Conjunctivae and EOM are normal. Pupils are equal, round, and reactive to light. Right eye exhibits no discharge. Left eye exhibits no discharge. No scleral icterus.  Neck: Normal range of motion. Neck supple.  No JVD present. Tracheal deviation present. No  thyromegaly present.  Cardiovascular: Normal rate and regular rhythm.   Pulmonary/Chest: Effort normal and breath sounds normal. No stridor. No respiratory distress. He has no wheezes. He has no rales. He exhibits no tenderness.  Abdominal: Soft. He exhibits no distension. There is no tenderness.  Genitourinary:  deferred  Musculoskeletal: Normal range of motion. He exhibits edema (Lt gluteal/lateral hip subcutaneous hematoma resolving).  Lymphadenopathy:    He has no cervical adenopathy.  Neurological: He is alert and oriented to person, place, and time. He has normal reflexes. A cranial nerve deficit is present. Coordination normal.  Skin: Skin is warm and dry.  Psychiatric:  SEE MSE  HGT 68" BP 103/65 Pulse 63 R-16  Depressive Symptoms: anxiety, insomnia, disturbed sleep, weight gain,  (Hypo) Manic Symptoms:   Elevated Mood:  NA Irritable Mood:  NA Grandiosity:  NA Distractibility:  NA Labiality of Mood:  NA Delusions:  NA Hallucinations:  NA Impulsivity:  NA Sexually Inappropriate Behavior:  NA Financial Extravagance:  NA Flight of Ideas:  NA  Anxiety Symptoms: Excessive Worry:  No Panic Symptoms:  Yes Agoraphobia:  No Obsessive Compulsive: No  Symptoms: None, Specific Phobias:  No Social Anxiety:  No  Psychotic Symptoms:  Hallucinations: No None Delusions:  No Paranoia:  No   Ideas of Reference:  No  PTSD Symptoms: Ever had a traumatic exposure:  No Had a traumatic exposure in the last month:  No Re-experiencing: No None Hypervigilance:  No Hyperarousal: No NONE Avoidance: Negative None  Traumatic Brain Injury: No NA  Past Psychiatric History: Diagnosis: NONE  Hospitalizations: NONE  Outpatient Care: NONE UNTIL NOW  Substance Abuse Care: NONE UNTIL NOW  Self-Mutilation: None  Suicidal Attempts: NO  Violent Behaviors: NO   Past Medical History:   Past Medical History  Diagnosis Date  . Gout   . Depression    History of Loss of  Consciousness:  No Seizure History:  No Cardiac History:  Yes RECENT ^ BP/TRIGLYCERIDES/^ CHOLSTEROL Allergies:  No Known Allergies Current Medications:  Current Outpatient Prescriptions  Medication Sig Dispense Refill  . allopurinol (ZYLOPRIM) 300 MG tablet Take 300 mg by mouth daily.      . citalopram (CELEXA) 20 MG tablet Take 20 mg by mouth daily.      Marland Kitchen LORazepam (ATIVAN) 1 MG tablet Take 1 tablet (1 mg total) by mouth every 6 (six) hours as needed for anxiety.  15 tablet  0  . metoprolol succinate (TOPROL XL) 25 MG 24 hr tablet Take 0.5 tablets (12.5 mg total) by mouth daily.  30 tablet  1  . Multiple Vitamin (MULTIVITAMIN WITH MINERALS) TABS tablet Take 1 tablet by mouth daily.      Marland Kitchen omega-3 acid ethyl esters (LOVAZA) 1 G capsule Take 1 g by mouth daily.      . Red Yeast Rice 600 MG CAPS Take 1 capsule by mouth daily.       No current facility-administered medications for this visit.    Previous Psychotropic Medications:  Medication Dose   SEE MEDS LIST  SEE MEDS LIST                     Substance Abuse History in the last 12 months: Substance Age of 1st Use Last Use Amount Specific Type  Nicotine NONSMOKER 0 0 0  Alcohol 12 08/10/2013 1/2 of fifth and 12 pack Beer/whiskey  Cannabis 0 0 0 0  Opiates 0 0 0 0  Cocaine 0 0 0 0  Methamphetamines 0 0 0 0  LSD 0 0 0 0  Ecstasy 0 0 0 0  Benzodiazepines 45 08/17/13 0 0  Caffeine Not elicited NA NA NA  Inhalants 0 0 0 0  Others: 0 0 0 0                      Medical Consequences of Substance Abuse: LIVER DAMAGED  Legal Consequences of Substance Abuse: RESTRICTED ncdl  Family Consequences of Substance Abuse: PARENTS/OLDEST SISTER CONCERNED-MISSED THANKSGIVING WITH FAMILY THIS YEAR  Blackouts:  Yes DT's:  No Withdrawal Symptoms:  Yes Diaphoresis Diarrhea Nausea Tremors Vomiting None  Social History: Current Place of Residence: [place] Place of Birth: [place] Family Members: M,F,2 B ,2 S Marital Status:   Single Children: 0  Sons: NA  Daughters: NA Relationships: Has binge drinking GF/  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 Legal History: DUI- [place] 2 GA 1 Drives with privelege  Hobbies/Interests:Kayaking/water sports   Family History:   Family History  Problem Relation Age of Onset  . Alcohol abuse Maternal Grandfather     Mental Status Examination/Evaluation: Objective:  Appearance: Fairly Groomed and plethoric  Eye Contact::  Good  Speech:  Clear and Coherent  Volume:  Normal  Mood:  Euthymic  Affect:  Congruent  Thought Process:  Logical  Orientation:  Full (Time, Place, and Person)  Thought Content:  WDL  Suicidal Thoughts:  No  Homicidal Thoughts:  No  Judgement:  Fair  Insight:  Lacking  Psychomotor Activity:  Tremor to touch  Akathisia:  NA  Handed:  Right  AIMS (if indicated):  NA  Assets:  Desire for Improvement Financial Resources/Insurance Vocational/Educational Others:  none    Laboratory/X-Ray Psychological Evaluation(s)   Elevated LFTs with abnormal albumin and total protein levels  CDIOP INTAKE WITH ANN EVANS   Assessment:  See below  AXIS I Alcohol dependence;Substance induced Mood Disorder  AXIS II Deferred  AXIS III Past Medical History  Diagnosis Date  . Gout   . Depression      AXIS IV problems related to legal system/crime and problems with primary support group  AXIS V 41-50 serious symptoms   Treatment Plan/Recommendations:    Laboratory:  FU with PCP  Psychotherapy: IOP Individual and group counseling  Medications: See list AllBee with C and Trazodone added  Routine PRN Medications:  No  Consultations: NONE  Safety Concerns:  NONE  Other: NA     [facility] 3/6/20153:32 PM

## 2013-08-23 NOTE — Progress Notes (Unsigned)
    Daily Group Progress Note  Program: CD-IOP   Group Time: 1:00-2:30pm  Participation Level: Active  Behavioral Response: Appropriate  Type of Therapy: Process Group  Topic: The first half of group was spent in process.  Group members introduced themselves and shared their sobriety dates, and then were invited by the counselor to check in.  They discussed issues related to early recovery including building up a support network, handling stress, and improving family relationships.  One group member inquired about managing resentments, and the group discussed this topic in detail.  A new group member introduced himself, shared his history, and explained the reasons he was in treatment.       Group Time: 2:45-4:00pm  Participation Level: Active  Behavioral Response: Appropriate  Type of Therapy: Psycho-education Group  Topic: Counselor provided two handouts related to resentments and forgiveness in order to continue the discussion from the first half of group.  Group members read the first handout, which was on the effects of resentments and meaning of forgiveness, and discussed what they learned from it.  Counselor emphasized that resentments hurt the person who holds onto them and can contribute to relapse.  Group members also read and discussed the poem "Anyway" by Mother Clarene Critchley.  At the end of the session, counselor asked each group member to share what they had learned from the session.   Summary: Patient reported a sobriety date of 2/22.  He reported that he was dreaming a lot at night and did not feel rested when he woke up.  He also reported that his anxiety level was "moderately high" because he had spent extra time at work yesterday.  He shared about his relationship with his girlfriend, who he described as a "dry drunk," and mentioned that he had enjoyed spending time with her granddaughter the other day.  During the psychoeducation session, he reported that the discussion  on resentments made him view a coworker's behavior differently, recognizing that expressing anger helped him avoid resentments.    Family Program: Family present? No   Name of family member(s): n/a  UDS collected: Yes Results: not yet available  AA/NA attended?: No  Sponsor?: No   [facility]

## 2013-08-26 ENCOUNTER — Other Ambulatory Visit (HOSPITAL_COMMUNITY): Payer: Self-pay | Admitting: *Deleted

## 2013-08-26 ENCOUNTER — Other Ambulatory Visit (HOSPITAL_COMMUNITY): Payer: BC Managed Care – PPO | Admitting: Psychology

## 2013-08-26 ENCOUNTER — Encounter (HOSPITAL_COMMUNITY): Payer: Self-pay | Admitting: Psychology

## 2013-08-26 DIAGNOSIS — F10230 Alcohol dependence with withdrawal, uncomplicated: Secondary | ICD-10-CM

## 2013-08-26 DIAGNOSIS — E559 Vitamin D deficiency, unspecified: Secondary | ICD-10-CM

## 2013-08-26 DIAGNOSIS — F1020 Alcohol dependence, uncomplicated: Secondary | ICD-10-CM

## 2013-08-26 DIAGNOSIS — F330 Major depressive disorder, recurrent, mild: Secondary | ICD-10-CM

## 2013-08-26 MED ORDER — TRAZODONE HCL 50 MG PO TABS: 50.0000 mg | ORAL_TABLET | Freq: Every evening | ORAL | Status: DC | PRN

## 2013-08-26 MED ORDER — B COMPLEX-C PO TABS: 1.0000 | ORAL_TABLET | Freq: Every day | ORAL | Status: AC

## 2013-08-26 NOTE — Telephone Encounter (Signed)
Prescriptions for Trazodone and B-complex ordered by C.Kober, PA were to be sent escript to [place] were printed in error on 08/23/13. Resent escript at this time

## 2013-08-26 NOTE — Progress Notes (Unsigned)
[name] Brown. CD-IOP: Orientation: The patient is a 48 yo single, Caucasian, male seeking treatment to address his alcohol dependence. He is seeking treatment at the recommendation of his employer. 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 He has been employed with this same company for 17 years. The patient He has a long history of alcohol use that began at age 8. The patient reported that 7 years ago his employer instructed him to seek treatment to address his drinking. He attended the IOP at [facility] and was able to remain sober for 20 months. The patient admitted he attended some [place] meetings, but he never really connected with anyone nor did he secure a sponsor. He reported that on New Year's Eve he found himself purchasing a bottle of liquor and he has been drinking ever since. In February of this year, the patient was once again instructed by his supervisor to enter treatment. The patient met with me on Monday, the 23rd, but he was very unstable shaky and I feared that he would suffer a seizure. We were transported to [facility] ED and the patient was evaluated and, subsequently, admitted for an alcohol detox. The patient reported he drinks up to a fifth of vodka daily in addition to 3-4 beers. The patient acknowledges the problems that alcohol has cause him and he appears determined to make change his life. He also admitted that he will lose his job if he doesn't stop drinking. The patient currently lives with his S/O. She is also a heavy drinker, but while the patient drinks daily, she is a binge drinker. The patient understands the challenges that his relationship represents relative to his sobriety and described their relationship as 'toxic'. The documentation was reviewed, signed and the orientation completed accordingly. The patient will begin the CD-IOP this afternoon.         Maylani Embree, LCAS

## 2013-08-26 NOTE — Progress Notes (Signed)
    Daily Group Progress Note  Program: CD-IOP   Group Time: 1-2:30 pm  Participation Level: Active  Behavioral Response: Appropriate and Sharing  Type of Therapy: Process Group  Topic: Group Process/Relapse Process: the first part of group was spent in process. Members shared about current issues and concerns in recovery. The newest member, in just his second group session, shared about a woman he had spent time with. This generated a discussion about focusing on recovery and avoiding distractions. There was good feedback and sharing among members. After process, members were presented with a brief psycho-ed on the 4-step relapse process - trigger, thoughts, cravings, and use. The important of addressing thoughts of using as soon as they occur was emphasized. During this time, the program director was meeting with current members regarding medications and the two new members.  Group Time: 2:45- 4pm  Participation Level: Minimal, he was in meeting with program director  Behavioral Response: Sharing  Type of Therapy: Psycho-education Group  Topic: Cravings and Triggers: In the second half of group, a Matrix slide show was shown on the topics of cravings and triggers. The slide show focused on the changes that occur with increased drug use and the associations that become stronger as one's use grows. Members were invited to share observations or their own experiences as their addiction was taking over more control. The group proved very informative and insightful to the group. The session ended with members sharing their plans for the weekend.   Summary:The patient reported he is doing okay. He reported he has been very busy with work and then coming to group. When another member asked, the patient explained that he goes to work early in the morning, comes to group, but then goes back to work again. He has gotten home later than he anticipated recently. On Wednesday he got home after 8 pm.  His S/O asked him how long he would be in this program? When he told her it would be 8 weeks, she appeared very surprised and told him he didn't need the program, he was strong enough to do it himself. She also questioned whether she could keep doing this. The patient had disclosed his S/O was also a heavy drinker, only she was a binge drinker as opposed to him, is a daily drinker. The group reminded this fellow that his S/O probably recognizes he is going to be making some big changes and they might not include her. He agreed with this observation. The patient spent much of second half of group meeting for his first session with the program director. He displays good motivation and responded well to this session. The patient's sobriety date remains 2/22.    Family Program: Family present? No   Name of family member(s):   UDS collected: No Results:   AA/NA attended?: No, but he has been told he must attend meetings   Sponsor?: No   Delano Scardino, LCAS

## 2013-08-28 ENCOUNTER — Other Ambulatory Visit (HOSPITAL_COMMUNITY): Payer: BC Managed Care – PPO | Admitting: Psychology

## 2013-08-28 ENCOUNTER — Encounter (HOSPITAL_COMMUNITY): Payer: Self-pay

## 2013-08-28 ENCOUNTER — Encounter (HOSPITAL_COMMUNITY): Payer: Self-pay | Admitting: Psychology

## 2013-08-28 DIAGNOSIS — F10230 Alcohol dependence with withdrawal, uncomplicated: Secondary | ICD-10-CM

## 2013-08-28 DIAGNOSIS — F32A Depression, unspecified: Secondary | ICD-10-CM

## 2013-08-28 DIAGNOSIS — F329 Major depressive disorder, single episode, unspecified: Secondary | ICD-10-CM

## 2013-08-28 NOTE — Progress Notes (Signed)
    Daily Group Progress Note  Program: CD-IOP   Group Time: 1-2:30 pm  Participation Level: Active  Behavioral Response: Appropriate and Sharing  Type of Therapy: Process Group  Topic: Group Process: The first part of group was spent in process. Members shared about the past weekend and any issues or concerns in early recovery. One member shared that he had relapsed over the weekend. He admitted he hadn't realized how much alcohol and marijuana are a part of his daily life. The group assured him that this happens and it will require him to more conscious of his relationship with chemicals. Other members talked about denial and the importance of being honest. One member admitted she is fighting cravings to smoke cigarettes and she concluded that it was really cannabis she was craving. The session was very informative and compelling.   Group Time: 2:45-4 pm   Participation Level: Active  Behavioral Response: Sharing  Type of Therapy: Psycho-education Group  Topic: "The Addiction Process": The second half of group was spent in a psycho-ed. Members observed a brief clip from the film, Addiction, an HBO special. It included an explanation of what happens to one's brain after continued drug and alcohol use. The changes in the brain were very clearly identified in the film and members gained a more clear understanding of the physical nature of their chronic illness. The session proved very productive to everyone present.   Summary: The patient reported he had been very busy this weekend. He had attended an [place] meeting and met someone who had been in his outpatient program over 5 years ago. This man had picked up his 5 year chip. The patient felt very welcomed by those in attendance and had picked up his starter chip. He had also gone to [place] to celebrate his father's birthday. His parents live in [place].  Everyone had been very happy to see him and agreed that he looked much better than he  has in a long time. The patient shared that his S/O continues to struggle with anxiety and she is very insecure and codependent. He reported he had found a pint of vodka that he had hidden long ago. The patient admitted he had hidden it in case he came home one day and found that the S/O had drunk all the vodka. He reported he had thrown it out. He is making good progress and displays good motivation for recovery. The patient's sobriety date remains 2/22.    Family Program: Family present? No   Name of family member(s):   UDS collected: Yes Results: positive for marijuana and Ativan from his detox at [facility] attended?: [employer]?: No   Remmy Riffe, LCAS

## 2013-08-30 ENCOUNTER — Other Ambulatory Visit (HOSPITAL_COMMUNITY): Payer: BC Managed Care – PPO | Admitting: Psychology

## 2013-08-30 DIAGNOSIS — F1994 Other psychoactive substance use, unspecified with psychoactive substance-induced mood disorder: Secondary | ICD-10-CM | POA: Diagnosis not present

## 2013-08-30 DIAGNOSIS — F10239 Alcohol dependence with withdrawal, unspecified: Secondary | ICD-10-CM

## 2013-08-30 DIAGNOSIS — F3289 Other specified depressive episodes: Secondary | ICD-10-CM | POA: Diagnosis not present

## 2013-08-30 DIAGNOSIS — F329 Major depressive disorder, single episode, unspecified: Secondary | ICD-10-CM | POA: Diagnosis not present

## 2013-08-30 DIAGNOSIS — F1020 Alcohol dependence, uncomplicated: Secondary | ICD-10-CM | POA: Diagnosis not present

## 2013-08-30 NOTE — Progress Notes (Signed)
    Daily Group Progress Note  Program: CD-IOP   Group Time: 1:00-2:30pm  Participation Level: Active  Behavioral Response: Appropriate  Type of Therapy: Process Group  Topic: Group members checked in by sharing their names and sobriety dates.  Counselor then used a 5-minute guided relaxation to help group members relax and focus.  Group members discussed issues relevant to early recovery, including dealing with denial and thoughts of using, managing difficult emotions, and building a support network.  One group member played a song about recovery and provided copies of the lyrics, and the group discussed the song briefly.  Counselor also provided a handout on PAWS and briefly reviewed the symptoms.   Group Time: 2:45-4:00pm  Participation Level: Active  Behavioral Response: Appropriate  Type of Therapy: Psycho-education Group  Topic: Counselor played a 20-minute video on self-compassion and led a discussion on the topic.  Group members discussed the differences between self-esteem and self-compassion, the components of self-compassion, negative self-talk, and the difficulties in providing oneself with self-compassion.  Counselor then led a 5-minute "self-compassion break," a guided meditation that included the 3 self-compassion components: mindfulness, common humanity, and self-kindness.  Group members shared briefly about their experience with the meditation.  Summary: Patient's sobriety date remains 2/22.  He reported that over the last several days, he had worked many hours and also spent time doing yard work at home.  He shared that he and his girlfriend had been getting along better lately, and he believed she was "coming around" about his treatment.  During the psycho-ed session, he shared about negative self-talk and noted that in our society, self-compassion is not encouraged.  Patient is doing well in early recovery and displayed a strong willingness to be open in the  group.  Family Program: Family present? No   Name of family member(s): n/a  UDS collected: No Results: n/a  AA/NA attended?: No  Sponsor?: No   [facility]

## 2013-08-30 NOTE — Progress Notes (Signed)
[name] Brown is a 48 y.o. male patient seeking treatment for alcohol dependence.  This was his first individual session and treatment planning session for the [facility].  Counselor invited client to share about himself and his history of substance use.  Client shared information about his work, family of origin, history of drinking and treatment, and his current romantic relationship.  He reported significant stress in his workplace due to changes in ownership in his company.  He initially decided to stop drinking and seek treatment when his supervisor at work suggested that he had a drinking problem and encouraged him to get help.  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  He also experiences stress in his relationship with his girlfriend, who he reports has substance use and anxiety issues.  He reported feeling very close to his girlfriend's 63.41 year old granddaughter, and reported significant relationship issues with both his girlfriend and her daughter (the mother of the granddaughter).  Client and counselor agreed that discussing his relationship would be an important part of his treatment.  Client also reported feeling close to his parents and described a recent positive interaction with his father.  He reported that his parents are supportive of him getting treatment.  Client also shared about his past experiences with treatment and 12-step programs.  He reported that he had attended an [place] meeting over the weekend and it was a more positive experience than he had had in the past.  Counselor then transitioned the conversation toward development of the treatment plan.  Client agreed that the first two goals, maintaining sobriety and developing a support network, were important to him.  He also identified a third goal of improving  communication within his close personal relationships.  He reported a desire to be more open with his emotions, to be closer to his family, and to learn to accept feedback.  Counselor and client completed and signed the treatment plan, and agreed to meet weekly on Wednesdays at 4:00pm.  In future sessions counselor will provide client with information on assertive communication and the [place] Fighting rules, and will encourage him to get in touch with his moment-to-moment emotional experiences.    [name], [name]

## 2013-08-31 ENCOUNTER — Encounter (HOSPITAL_COMMUNITY): Payer: Self-pay | Admitting: Psychology

## 2013-08-31 NOTE — Progress Notes (Signed)
    Daily Group Progress Note  Program: CD-IOP   Group Time: 1-2:30 pm  Participation Level: Minimal  Behavioral Response: Sharing  Type of Therapy: Process Group  Topic: Group members checked in by sharing their names and sobriety dates.  Counselor then led group in 5 minutes of HeartMath to promote focus and calm.  The group discussed topics relevant to early recovery, including family issues and dealing with cravings and thoughts of using.  One group member also processed a recent relapse and received feedback from the group.  Chaplain Luz Brazen then arrived to group and led a discussion on values and spiritual beliefs.  She included an activity in which group members wrote a belief on a stone that they could take with them  Group Time: 2:45- 4pm  Participation Level: Active  Behavioral Response: Sharing  Type of Therapy: Psycho-education Group  Topic: Group members continued the discussion on beliefs and values.  Counselor challenged them to identify ways that they could live out their values in their daily lives.  She emphasized that during active addiction, a person's values are pushed aside in favor of substances. Several group members shared about their values and the ways that they demonstrate them in their daily lives.  They also identified challenges and next steps toward living in line with their values.  At the end of the session, counselor led a graduation ceremony for a member who has completed the program.  Summary: Patient's sobriety date remains 2/22.  He reported that he had been in a "funk" yesterday, and was feeling badly about unintentionally breaking a person's anonymity.  The group provided him with feedback about anonymity and assured him that he had acted appropriately.  During the chaplain's presentation, he shared that he values truth and honesty.  Patient is doing well in early recovery and shows willingness to be open with the group.   Family Program:  Family present? No   Name of family member(s):   UDS collected: No Results:   AA/NA attended?: No, not since last Saturday  Sponsor?: No   Asjia Berrios, LCAS

## 2013-09-02 ENCOUNTER — Encounter (HOSPITAL_COMMUNITY): Payer: Self-pay | Admitting: Psychology

## 2013-09-02 ENCOUNTER — Other Ambulatory Visit (HOSPITAL_COMMUNITY): Payer: BC Managed Care – PPO | Admitting: Psychology

## 2013-09-02 DIAGNOSIS — F1020 Alcohol dependence, uncomplicated: Secondary | ICD-10-CM

## 2013-09-02 DIAGNOSIS — F329 Major depressive disorder, single episode, unspecified: Secondary | ICD-10-CM

## 2013-09-02 DIAGNOSIS — F32A Depression, unspecified: Secondary | ICD-10-CM

## 2013-09-02 DIAGNOSIS — M109 Gout, unspecified: Secondary | ICD-10-CM

## 2013-09-04 ENCOUNTER — Encounter (HOSPITAL_COMMUNITY): Payer: Self-pay

## 2013-09-04 ENCOUNTER — Other Ambulatory Visit (HOSPITAL_COMMUNITY): Payer: BC Managed Care – PPO | Admitting: Psychology

## 2013-09-04 DIAGNOSIS — F1020 Alcohol dependence, uncomplicated: Secondary | ICD-10-CM

## 2013-09-05 NOTE — Progress Notes (Signed)
[name] Brown is a 48 y.o. male patient seeking treatment in [facility] for alcohol dependence.  This was his second individual session as part of the program.  Counselor opened session by asking client to share about how the last week had been.  Client reported that he had been working a lot, had helped his father out with some yard work, and felt that he was doing well overall.  He shared that he was noticing some of his emotional reactions more readily, and was thinking about the ways he wants to express himself differently.  Client and counselor discussed an instance at work in which he expressed frustration in a way that he did not like by snapping at a coworker.  He shared that in the future he would like to approach that situation with more calm.  He also discussed current projects at work and the stress that he was under there.  He reported that despite the stress, he was coping fairly well, especially compared to when he was drinking.  Client also shared about a conversation he had had with a coworker who also has a drinking problem, stating that it felt good to be able to relate to someone else with similar struggles.  Counselor asked permission to discuss client's positive drug test.  Client reported that he had taken one of his girlfriend's pain pills to cope with back pain, noting that he had taken over the counter medications that had not helped.  He did not appear distressed about his use and showed little affect during the conversation.  He noted that when other group members shared about how opiate use led them to return to drinking, it got his attention.  He appeared to understand that taking prescription drugs was a risky decision for him.  Counselor and client then discussed his goal of becoming more open in relationships and getting in touch with his emotions.  He reported that he was noticing his emotional responses more often now and was pleased with that.  He also reported that he felt there  were many things he did not know about himself because he had spent so much of his life drinking.  He shared about some of his drug use habits as an adolescent and young adult.  Counselor validated these feelings, noting that many people in recovery after years of drug use find that they do not know themselves well.  Client stated that he felt ready to learn more things about himself, and identified continuing to be open to emotions was a next step for him.  Client briefly discussed his relationship with his girlfriend, noting that there had been some improvement in this area and that he had plans to ask her to attend a meeting with him.  He noted that he planned to attend two AA meetings on Saturday.  Counselor closed session by asking client what he had taken from the time, and he reported that he was thinking more about his emotional responses, and was excited about his idea to ask his girlfriend to go to a meeting with him.   [name], [place]

## 2013-09-05 NOTE — Progress Notes (Unsigned)
    Daily Group Progress Note  Program: CD-IOP   Group Time: 1:00-2:30pm  Participation Level: Active  Behavioral Response: Appropriate  Type of Therapy: Process Group  Topic: Group members checked in by sharing their names and sobriety dates.  Counselor led group in 5 minutes of HeartMath breathing in order to promote relaxation and focus.  Group members then discussed how they were doing and shared about issues related to early recovery, including managing volatile emotions and PAWS symptoms, grief and loss, and making friends with others in recovery.  Three new group members were present, and each one shared about their reasons for seeking treatment in the program.       Group Time: 2:45-4:00pm  Participation Level: Active  Behavioral Response: Appropriate  Type of Therapy: Psycho-education Group  Topic: Counselor led a session on Distress Tolerance.  She led an exercise to demonstrate distress tolerance, asking group members hold their arms in front of them for about 2 minutes and then having them share how they coped with the discomfort.  She reviewed distraction strategies (represented by the acronym ACCEPTS) and asked group members to provide examples of these strategies.  She also explained the concept of self-soothing and had group members brainstorm self-soothing methods for each of the five senses.  Counselor emphasized that these strategies were especially helpful for managing cravings in early recovery.   Summary: Patient reported a sobriety date of 2/22.  Later on in the group session, he received a positive drug test and admitted to using Vicodin on Sunday, making his new sobriety date 3/16.  He reported that he had not attended any meetings since the last session, and stated he had plans to attend 2 meetings on Saturday.  He reported that he had plans to ask a friend in recovery for recommendations for a temporary sponsor.  During the psychoeducation group, he shared  several techniques that help him self-soothe.   Family Program: Family present? No   Name of family member(s): n/a  UDS collected: No Results: n/a  AA/NA attended?: No  Sponsor?: No   [facility]

## 2013-09-06 ENCOUNTER — Encounter (HOSPITAL_COMMUNITY): Payer: Self-pay | Admitting: Psychology

## 2013-09-06 ENCOUNTER — Other Ambulatory Visit (HOSPITAL_COMMUNITY): Payer: BC Managed Care – PPO

## 2013-09-06 NOTE — Progress Notes (Signed)
    Daily Group Progress Note  Program: CD-IOP   Group Time: 1-2:30 pm  Participation Level: Active  Behavioral Response: Appropriate and Sharing  Type of Therapy: Process Group  Topic: Group Process: First part of group was spent in process. Members shared about the past weekend and disclosed issues and concerns that they have faced over the weekend. One member shared that a previous member who had graduated successfully had relapsed. He was very upset. I read to the group from [place]. the reading focused on detachment and recognizing nothing we say or do will stop another from drinking or drugging. The group processed their sense of helplessness and it proved to be a session with a different perspective and one that these members have not typically experienced.   Group Time: 2:45- 4pm  Participation Level: Active  Behavioral Response: Sharing  Type of Therapy: Psycho-education Group  Topic: Wheel of Life: The second half of group was spent in a psycho-ed called the Wheel of Life. Members were asked to come to the board and draw where they stand on each of the 8 categories of life. After drawing their 'wheel' they discussed each of the categories and how they are addressing this part of their life. The session proved revealing in many ways. Every member acknowledged having very little 'Fun & Recreation'. Many have been drinking or drugging for so long they don't know what to do with themselves in early sobriety. It proved to be a revealing session and provoked good discussion and feedback among members.    Summary: The patient reported he had attended the same [place] meeting on Saturday morning as he had last week. He saw many of the same men and it had been a good meeting. He also reported that the fellow he had met the week before, who had picked up the 5 year chip, had phoned and left a message on Sunday. The patient had not returned the call, but suggested that he was going to contact  him and see if he would agree to be his 'temporary' sponsor. When sharing about his 'wheel', the patent admitted that as his drinking had taken up more time, the fun and recreation part of his life had gone out the window. The patient used to kayak and hike, but hadn't done any of this in a long time. He is getting his spiritual needs addressed in the [place] meetings. The patient is attending this program and spending most of the remainder of his weekdays at work. He actually hasn't cut back any, but just goes back after the group finishes and stays until after 8 pm. He will have to increase his AA meetings to more than just one per week if he intends to remain enrolled in this program. The patient's sobriety date is 2/22.  Family Program: Family present? No   Name of family member(s):   UDS collected: Yes Results: Not back from lab yet  AA/NA attended?: YesSaturday  Sponsor?: No   Floride Hutmacher, LCAS

## 2013-09-06 NOTE — Progress Notes (Unsigned)
Patient ID: [name] Brown, male   DOB: 17-Oct-1965, 48 y.o.   MRN: [id number]   PHONE CALL  PT CALLED AT PHONE #/ [place] OF GROUP AND INQUIRING ABOUT HIS STATUS.INVITATION TO CALL BACK LEFT AS WEELL.

## 2013-09-09 ENCOUNTER — Other Ambulatory Visit (HOSPITAL_COMMUNITY): Payer: BC Managed Care – PPO | Admitting: Psychology

## 2013-09-09 DIAGNOSIS — F1020 Alcohol dependence, uncomplicated: Secondary | ICD-10-CM | POA: Diagnosis not present

## 2013-09-09 DIAGNOSIS — M109 Gout, unspecified: Secondary | ICD-10-CM

## 2013-09-10 ENCOUNTER — Encounter (HOSPITAL_COMMUNITY): Payer: Self-pay | Admitting: Psychology

## 2013-09-10 NOTE — Progress Notes (Signed)
    Daily Group Progress Note  Program: CD-IOP   Group Time: 1-2:30 pm  Participation Level: Active  Behavioral Response: Sharing  Type of Therapy: Psycho-education Group  Topic: Psycho-Ed: upon completing the check-in, the group was provided a psycho education presentation on the 4 major causes of relapse in early recovery. Each of the 4 most common causes were discussed at length with members giving examples of what they had experienced. As every member was over any physical cravings or withdrawal, this brought the focus to the remaining 3 conditions. The session proved informative with group members who had relapsed in the past identifying what had specifically led to their use. There was good feedback and sharing among members.   Group Time: 2:45- 4pm  Participation Level: Minimal  Behavioral Response: distant, shared little of himself  Type of Therapy: Process Group  Topic: Group process: the second half of group was spent in process. Members shared about current issues and concerns in early recovery. One member read the 12 and 12 for today and felt it was very powerful and pertinent. Four members had remembered to bring pictures so those were passed around. Some were from 50+ years ago and were clearly treasured memories.   Summary:The patient checked-in with his same old sobriety date. At the conclusion of the check-in, I questioned him about the date based on the results of his last drug test. He asked if that was about the Vicodin. He updates his sobriety date to 3/16. I reminded the group that using any mind-altering drug is a relapse and can place one in great danger of relapse. He did not share about the past weekend, but when questioned later in the session, the patient admitted he had only attended the Saturday morning meeting. He noted the intention to secure a temporary sponsor, but even his interaction with others in recovery is distant. The group was reminded that the  program requires a minimum of 4 12-step meetings per week and if one isn't engaged in the Fellowship, I believe they aren't going to make it. The patient was quiet for most of the remainder of the group except when I asked how he might 'psychologically long" for his drink, the patient explained that drinking for him was and has been the 'norm' so not drinking would be the exception. The patient seems disengaged from the importance of his recovery and is not doing anything during the week beyond coming here to group. He will have to step up or leave.     Family Program: Family present? No   Name of family member(s):   UDS collected: Yes Results:not returned from lab  AA/NA attended?: YesSaturday  Sponsor?: No   Alece Koppel, LCAS

## 2013-09-11 ENCOUNTER — Other Ambulatory Visit (HOSPITAL_COMMUNITY): Payer: BC Managed Care – PPO | Admitting: Psychology

## 2013-09-11 ENCOUNTER — Encounter (HOSPITAL_COMMUNITY): Payer: Self-pay

## 2013-09-11 DIAGNOSIS — F1020 Alcohol dependence, uncomplicated: Secondary | ICD-10-CM

## 2013-09-12 NOTE — Progress Notes (Signed)
[name] Brown is a 48 y.o. male patient seeking treatment for alcohol dependence in the [facility].  This was his third individual session as part of that program.  Counselor opened session by inviting client to discuss how he was feeling about being in treatment and the requirements of the program.  Client admitted that he was struggling with having to change his sobriety date to 3/16 because of taking 5 mg of Vicodin.  He said he became upset earlier in the week when he realized that he had achieved 30 days without drinking and his sobriety date did not reflect that.  He shared that in the past 25 years, with the exception of one 5-month period of sobriety, he has only achieved 30 days of sobriety twice.   Counselor and client also discussed the program requirement of attending 4 12-step meetings per week.  Client reported that he had attended one meeting weekly since starting the program, and counselor inquired about how he could fit more meetings into his schedule.  Client identified two additional meetings he could attend and said that he would struggle to get to 4 meetings because of his work schedule.  Counselor informed client that he would need to meet the 4 meeting requirement by next Wednesday in order to continue on in the program.  Client appeared to be somewhat upset by this although he expressed that he understood the reason for this requirement.  Throughout the session he appeared to be somewhat fixated on this conversation, bringing it up a few times while other topics were discussed.  By the end of the session, he stated that "it was up to [him]" whether he continued in the [place] or not and reported that he would do his best to get to 4 meetings.  Counselor and client discussed the changes he has seen in himself since he stopped drinking, such as feeling less stressed by everyday events, being more in touch with his feelings, and being able to express his feelings to others in a healthier way.  He  reported feeling very positive about the changes he has noticed in himself and emphasized that others, especially coworkers, had commented on the difference.  He also shared that he had a chance to talk with a friend of a friend who is struggling with alcoholism, and encouraged him to attend meetings.  He reported that he shared about some of his own experience with this person and felt good about being able to help someone else and provide hope.  Counselor inquired about client's next step toward achieving his treatment goals, and client responded that he felt it was time for him to acquire a sponsor and begin step work.  Counselor closed session by asking client what he was taking from the session, and client responded that what was most on his mind was getting to 4 meetings this week.  Counselor will follow up with client about the meeting requirement in the next session and will continue to work with him toward his goals of maintaining sobriety, building a support network, and identifying and expressing feelings.   [name], [place]

## 2013-09-12 NOTE — Progress Notes (Unsigned)
    Daily Group Progress Note  Program: CD-IOP   Group Time: 1:00-2:30pm  Participation Level: Minimal  Behavioral Response: Resistant  Type of Therapy: Process Group  Topic: The first half of group was spent in process.  Counselor opened the group with a check-in and then led 5 minutes of a relaxation breathing exercise including some calming music.  Counselor then led a review on the group expectations and beliefs, and group members discussed concerns and suggestions about these expectations. Much of the conversation centered around the therapeutic benefits of sharing feelings and attending to others while they share. Group members then shared about issues they experienced during early recovery, including dealing with family health issues, handling denial, and finding a recovery community that meets their needs.     Group Time: 2:45-4:00pm  Participation Level: Active  Behavioral Response: Appropriate  Type of Therapy: Psycho-education Group  Topic: Counselor led a discussion on the Serenity Prayer.   Group members spent time creating lists of things that they can and cannot change, and then shared their lists with the entire group.  Counselor emphasized that in recovery, addicts have the ability to change their own behaviors, attitudes, and outlook, and cannot change other people, the past, or unforeseen events.  Counselor asked clients to share one thing they could change that day in order to better support their recovery, and several group members shared ideas.   Summary: Patient's sobriety date is 3/16.  He appeared to be very resistant to changing his sobriety date, noting that he had not had a drink since his previous date of 2/22. Group members provided him with feedback about this, sharing their own experiences with opiate use and alcoholism.  He was quiet throughout the process group, but did share during the psychoeducation group that he planned to change his diet to be  more healthy.     Family Program: Family present? No   Name of family member(s): n/a  UDS collected: No Results: n/a  AA/NA attended?: No  Sponsor?: No   [facility]

## 2013-09-13 ENCOUNTER — Other Ambulatory Visit (HOSPITAL_COMMUNITY): Payer: BC Managed Care – PPO | Admitting: Psychology

## 2013-09-13 ENCOUNTER — Encounter (HOSPITAL_COMMUNITY): Payer: Self-pay

## 2013-09-13 DIAGNOSIS — F1020 Alcohol dependence, uncomplicated: Secondary | ICD-10-CM | POA: Diagnosis not present

## 2013-09-13 DIAGNOSIS — F329 Major depressive disorder, single episode, unspecified: Secondary | ICD-10-CM

## 2013-09-13 DIAGNOSIS — F32A Depression, unspecified: Secondary | ICD-10-CM

## 2013-09-16 ENCOUNTER — Other Ambulatory Visit (HOSPITAL_COMMUNITY): Payer: BC Managed Care – PPO | Admitting: Psychology

## 2013-09-16 DIAGNOSIS — F1020 Alcohol dependence, uncomplicated: Secondary | ICD-10-CM | POA: Diagnosis not present

## 2013-09-16 DIAGNOSIS — F329 Major depressive disorder, single episode, unspecified: Secondary | ICD-10-CM

## 2013-09-16 DIAGNOSIS — F32A Depression, unspecified: Secondary | ICD-10-CM

## 2013-09-17 ENCOUNTER — Encounter (HOSPITAL_COMMUNITY): Payer: Self-pay | Admitting: Psychology

## 2013-09-17 NOTE — Progress Notes (Signed)
    Daily Group Progress Note  Program: CD-IOP   Group Time: 1-2:30 pm  Participation Level: Minimal  Behavioral Response: patient is very quiet, almost observing  Type of Therapy: Process Group  Topic: Group Process; the first part of group was spent in process. After check-in, a member made a heartfelt and painful confession that he had lied to the group during the last session. He had relapsed, but been so embarrassed and ashamed that he couldn't admit it on Wednesday. The group provided positive and validating feedback and emphasized the good news, which was that he had "come back". A new member was also present and he introduced himself. He had also relapsed after 10+ years of sobriety and he received a warm welcome from his new group members. During this session the program director was meeting with new group members and current ones regarding medications and any issues or concerns about their prescriptions.   Group Time: 2:45- 4pm  Participation Level: Minimal  Behavioral Response: Sharing  Type of Therapy: Psycho-education Group  Topic: Psycho Ed; the second part of group was spent in an educational presentation. The group was asked to identify the pros and cons of sobriety versus active addiction. Members were quick to identify the pros or positives of active addiction and the cons. They were able to easily identify the pros of sobriety, but had a tough time identifying the cons of sobriety. There was good interaction and feedback among members and it proved insightful and provided a bonding or experience of connection among the group.   Summary: The patient reported he was working and very busy, but doing well. He admitted he had not attended any meetings since Saturday. In all, he has only gone to 3 AA meetings in 3 weeks. I reminded the group that no one needed to be here, but if they are to stay, they must attend a minimum of 4 12-step meetings per week. The patient appears  distant and resistant versus an attitude of gratitude that was present when he first arrived here in the program. It is unclear why exactly he is here in the program unless he is being required to attend by his employer. He has downplayed the importance of his work in his treatment. The patient's sobriety date is 3/16.     Family Program: Family present? No   Name of family member(s):   UDS collected: No Results:  AA/NA attended?: No  Sponsor?: No   Farida Mcreynolds, LCAS

## 2013-09-18 ENCOUNTER — Encounter (HOSPITAL_COMMUNITY): Payer: Self-pay

## 2013-09-18 ENCOUNTER — Other Ambulatory Visit (HOSPITAL_COMMUNITY): Payer: BC Managed Care – PPO | Attending: Psychiatry | Admitting: Psychology

## 2013-09-18 DIAGNOSIS — F3289 Other specified depressive episodes: Secondary | ICD-10-CM | POA: Diagnosis not present

## 2013-09-18 DIAGNOSIS — F10239 Alcohol dependence with withdrawal, unspecified: Secondary | ICD-10-CM

## 2013-09-18 DIAGNOSIS — F1020 Alcohol dependence, uncomplicated: Secondary | ICD-10-CM | POA: Diagnosis present

## 2013-09-18 DIAGNOSIS — F1994 Other psychoactive substance use, unspecified with psychoactive substance-induced mood disorder: Secondary | ICD-10-CM | POA: Insufficient documentation

## 2013-09-18 DIAGNOSIS — F329 Major depressive disorder, single episode, unspecified: Secondary | ICD-10-CM | POA: Diagnosis not present

## 2013-09-19 ENCOUNTER — Other Ambulatory Visit (HOSPITAL_COMMUNITY): Payer: BC Managed Care – PPO | Admitting: Psychology

## 2013-09-19 DIAGNOSIS — F329 Major depressive disorder, single episode, unspecified: Secondary | ICD-10-CM

## 2013-09-19 DIAGNOSIS — F1020 Alcohol dependence, uncomplicated: Secondary | ICD-10-CM

## 2013-09-19 DIAGNOSIS — F32A Depression, unspecified: Secondary | ICD-10-CM

## 2013-09-19 NOTE — Progress Notes (Signed)
    Daily Group Progress Note  Program: CD-IOP   Group Time: 1:00-2:30pm  Participation Level: Active  Behavioral Response: Appropriate  Type of Therapy: Process Group  Topic: Group members checked in by sharing their names and sobriety dates.  Counselor led the group in 5 minutes of Heart[employer] relaxation.  Group members then shared and discussed issues related to early recovery, including managing stress, missing the "old life," and the importance of identifying and expressing feelings.       Group Time: 2:45-4:00pm  Participation Level: Active  Behavioral Response: Appropriate  Type of Therapy: Psycho-education Group  Topic: Counselor led a discussion on assertiveness.  Group members reviewed a handout outlining the differences between passiveness, aggressiveness, and assertiveness.  They discussed their typical patterns of communication, the origins of those patterns, and the drawbacks to being either passive or aggressive.  Counselor then led 4 roleplay exercises in which group members acted out both their typical communication style and practiced assertiveness skills.   Summary: Patient's sobriety date remains 3/16.  Patient reported that he had attended a meeting after work on Monday.  He also reported that he had been introduced to two potential sponsors by another person in recovery, and was going to decide on one of them to be his sponsor soon.  He was quiet but attentive during the psychoeducation group, although he declined to participate in one of the roleplay scenarios.   Family Program: Family present? No   Name of family member(s): n/a  UDS collected: No Results: n/a  AA/NA attended?: YesMonday  Sponsor?: No   [facility]

## 2013-09-20 ENCOUNTER — Other Ambulatory Visit (HOSPITAL_COMMUNITY): Payer: BC Managed Care – PPO

## 2013-09-21 ENCOUNTER — Encounter (HOSPITAL_COMMUNITY): Payer: Self-pay | Admitting: Psychology

## 2013-09-21 NOTE — Progress Notes (Signed)
    Daily Group Progress Note  Program: CD-IOP   Group Time: 1-2:30 pm  Participation Level: Minimal  Behavioral Response: Sharing  Type of Therapy: Process Group  Topic: Group Process: the first part of group was spent in process. Members  shared about the past weekend and any struggles or issues that they had faced. Some members disclosed active engagement in their recovery, while others had done nothing to address their chronic illness. The importance of managing a chronic illness was emphasized as well as the criteria this program requires.   Group Time: 2:45- 4pm  Participation Level: Active  Behavioral Response: Sharing  Type of Therapy: Psycho-education Group  Topic: Pro's & Con's; Part 2/Al-anon prayer; the second half group was spent reviewing the psycho-ed that was presented on Friday. Members were asked to identify [place] staying sober  was so hard since they had identified so few con's to sobriety? There was good disclose among members. In addition, a handout was provided that included a prayer shared in [place] meetings when the member lets go of trying to control or change anyone or anything but themselves.   Summary: The patient reported he had attended 2 AA meetings on Saturday and planned on going to a meeting tonight. When asked about a sponsor, he reported he is seeking to secure a temporary sponsor in the near future and is going to find somebody within the next few days. When asked what has been so difficult about sobriety, the patient admitted that 'accepting changes', including becoming more honest, that he is finding the most challenging. The patient pointed out that sobriety requires changes in almost every aspect of his life and this is proving very challenging. The patient had been instructed to step up his 12 step attendance or he would be discharged. It remains to be seen how committed he is to this chronic disease. His sobriety date is 3/16.   Family Program:  Family present? No   Name of family member(s):   UDS collected: No Results:  AA/NA attended?: YesSaturday  Sponsor?: No, but he reported he is going to ask a fellow in the next week   Tyneshia Stivers, LCAS

## 2013-09-23 ENCOUNTER — Other Ambulatory Visit (HOSPITAL_COMMUNITY): Payer: BC Managed Care – PPO | Admitting: Psychology

## 2013-09-23 DIAGNOSIS — F1020 Alcohol dependence, uncomplicated: Secondary | ICD-10-CM | POA: Diagnosis not present

## 2013-09-24 ENCOUNTER — Encounter (HOSPITAL_COMMUNITY): Payer: Self-pay | Admitting: Psychology

## 2013-09-24 NOTE — Progress Notes (Signed)
Daily Group Progress Note  Program: CD-IOP   Group Time: 1-2:30 pm  Participation Level: Active  Behavioral Response: Appropriate and Sharing  Type of Therapy: Process Group  Topic: Group Process: the first part of group was spent in process. The group was much smaller with a number of members still out from the holiday weekend. Members shared about the past weekend and any concerns or issues that may have appeared. At my urging, one member disclosed and inappropriate relationship with a fellow group members. It had only begun early lat week and consisted of talking and texting. Despite just a few days, it accelerated quickly and spun out of control, when the member's husband found the text messages. The patient explained his part of this brief disaster and expressed his remorse and apologized to his fellow group members. It was an intense session with members very attentive, but non-judgmental of their fellow group members. The member was applauded for his disclosures and a good discussion ensued about handling 'addictive' thoughts and its accompanying mentality.  Group Time: 2:45- 4pm  Participation Level: Active  Behavioral Response: Sharing  Type of Therapy: Psycho-education Group  Topic: Taking a Daily Inventory. The second half of group was spent in a psycho-ed. Members were provided a handout with a list of personality characteristics common of active addiction and self-will run wild. This list of characteristics was contrasted with a list of those more typical of sobriety and characteristics of one's Higher Power. Members discussed the characteristics and identified those most common when they are drinking or drugging. This proved particularly fitting considering the first part of group spent discussing self-will gone wild. The session was very open and members very honest about themselves. Emphasized in this session was the importance of identifying or picking up on this type of  thinking and, most important, talking to someone and addressing them when they first appear.   Summary: The patient reported he had had a good weekend. He stated he had attended [place] meetings on Friday and Saturday mornings. He also noted he had secured a temporary sponsor. This was good news and he was applauded by the group. The patient reported he had taken it easy and another member commented on how much better he looked. There was unanimous agreement that he did look different and there was a more relaxed less stressful appearance. The patient reported he had spent the first part of the weekend with his S/O and her two yo granddaughter. He had enjoyed being around this little girl. On Sunday he had driven up to [place] and had Easter dinner with his parents and sister. He admitted his older sister had also commented on his improved appearance. The patient reported that on the drive back he had passed a well-known set of rap[place] on the [facility]. He had turned the car around and went back and just sat and looked at this famous churning water known as the "[place]". He reported it was powerful water and he just stared at it for about 15 minutes and it was a cathartic experience. In part two, the patient identified anger and boredom as the characteristics of his addictive self and the attitudes he most typically displays. When asked to identify a feeling, he identified feeling "content". The patient seemed much more accepting in group today than in past sessions. There was a sense of serenity and peace that has not been seen before. He responded well to this intervention and made some excellent  comments. His sobriety date is 3/16.   Family Program: Family present? No   Name of family member(s):   UDS collected: No Results:   AA/NA attended?: YesFriday and Saturday  Sponsor?: Yes, he reported having secured a temporary sponsor on Saturday   Latoshia Monrroy, LCAS

## 2013-09-24 NOTE — Progress Notes (Signed)
[name] Brown is a 48 y.o. male patient seeking treatment for alcohol dependence in the CDIOP.  This was his fourth individual session as part of that program.  Client reported that he had attended 4 AA meetings over the past week and had therefore met the program requirement.  He also reported that he had been introduced to two potential sponsors by another person at an [employer], and was planning to choose one of them as his sponsor over the next couple of days.  Counselor validated the work he had done to accomplish both of these things.  Counselor asked client to clarify about his work requirements related to treatment, and he reported that he was not required to continue working full-time hours while in the program, but chooses to do so because of the large workload he is dealing with now.  He emphasized that his employers were supportive and let him know that he could take time off if he needed to.  Counselor also inquired about how client practices self-care, continuing a conversation from a group session prior to the individual session.  He reported that he rarely practices self-care, and has trouble taking "down time."  He shared that recently he has been noticing how much work there is to do around the house, and feels a need to take care of that work rather than relax during his free time.  He shared that he was raised to prioritize work over leisure and referred back to a story he had told in a previous session, stating that he had to "chip the bricks" - meaning that there was always some type of work to be found, and it was his responsibility to find it and do it.  He noted that he had a beach vacation scheduled for next month, and planned to use that time to relax and enjoy himself.  He reported that the group session on self-care made him think about how he spends his free time, and he would consider ways to increase his self-care behaviors.  Counselor and client also discussed client's girlfriend.   Client reported that things were going well with her, and he was looking forward to spending time with her granddaughter over the weekend.  He reported that she was experiencing health problems, and was waiting to hear about a possible cancer diagnosis.  He seemed concerned about her, and expressed confusion about her reaction, which to him seem much less worried and anxious than he expected.  He reported that she seemed to be more and more understanding about his recovery.  At the very end of session, client made another reference to changing his sobriety date.  Counselor will open next session by processing this comment with client.  Counselor will also conduct a treatment plan review during the next session.     [name], [place]

## 2013-09-25 ENCOUNTER — Encounter (HOSPITAL_COMMUNITY): Payer: Self-pay

## 2013-09-25 ENCOUNTER — Other Ambulatory Visit (HOSPITAL_COMMUNITY): Payer: BC Managed Care – PPO | Admitting: Psychology

## 2013-09-25 DIAGNOSIS — F1020 Alcohol dependence, uncomplicated: Secondary | ICD-10-CM

## 2013-09-26 NOTE — Progress Notes (Signed)
[name] Brown is a 48 y.o. male patient seeking treatment for alcohol dependence in the CDIOP.  This was his fifth individual session as part of that program.  Counselor opened session by informing client that they would complete a treatment plan review during the session.  Counselor also inquired about a comment that client had made at the end of the previous session, which seemed to indicate he was still upset about changing his sobriety date.  Client denied any anger or negative feelings about changing his sobriety date, and reported that he had told his new sponsor about his struggle with it.  Client reported that he had been doing well, and had taken time for self-care over the holiday weekend.  He reported that he spent time with his girlfriend and her granddaughter, his parents, and his siblings, and had stopped by a favorite spot on a river to reflect.   He also spent time listening to an [place] audio program, which he enjoyed.  Client shared that he had been reflecting on his own personal potential, and noted that with maintained sobriety he may be able to move to a job that better reflects his technical skills, training, and problem-solving ability.  Counselor then conducted a treatment plan review, asking client to reflect on the progress he had made toward each of his goals and to identify next steps.  Client reported significant progress toward each goal.  Regarding his first goal of maintaining sobriety, he shared that he is becoming more involved with AA and is developing tools to stay sober.  He indicated that working with his sponsor was a next step in this area.  Regarding the second goal of building a support network, he reported that he now has a sponsor and is making an effort to meet people at meetings.  He also has support from his family and girlfriend.  Regarding his third goal of improving communication in relationships, he noted that he is becoming increasingly aware of his feelings and has  made an effort to communicate them to others.  He shared that he was not raised to share his feelings, and has been pleased by the results of trying this for the first time.  He noted that his next step was simply to continue making an effort in this area.  Overall, client appears to be feeling very positive about his recovery and is making an effort to increase his involvement in the [place] fellowship.  Counselor will continue to work with him towards each of his treatment goals.    [name], [place]

## 2013-09-26 NOTE — Progress Notes (Unsigned)
    Daily Group Progress Note  Program: CD-IOP   Group Time: 1:00-2:30pm  Participation Level: Active  Behavioral Response: Appropriate  Type of Therapy: Process Group  Topic: The first hour of group was spent in process.  Counselor led a check-in and five minutes of relaxation.  Group members shared about their progress and issues they were experiencing in early recovery, including managing symptoms of PAWS.  After a presentation by a hospital Chaplain, the third hour of group was also spent in process.  A new group member shared about herself and received encouragement and feedback from other members.     Group Time: 2:45-4:00pm  Participation Level: Active  Behavioral Response: Appropriate  Type of Therapy: Psycho-education Group  Topic: The second hour of group was spent with [place], who led a discussion on the topic of forgiveness.  Group members shared their beliefs and ideas about forgiveness and discussed the need to forgive others and themselves. They also shared about the difficulties they have with forgiveness, particularly with forgiving themselves for their actions during active addiction.   Summary: Patient's sobriety date remains 3/16.  He reported that he was doing well and had attended a speaker meeting on Monday.  He shared that he had taken his girlfriend to an endoscopy appointment yesterday, and they had received positive news from the doctor about her health.  During the discussion on forgiveness, he shared that he has started to consider his own potential and how his drinking has prevented him from accomplishing all he could have.  He also shared that he values forgiveness as a sign of strength.   Family Program: Family present? No   Name of family member(s): n/a  UDS collected: No Results: n/a  AA/NA attended?: [place] and Tuesday  Sponsor?: Yes   [facility]

## 2013-09-27 ENCOUNTER — Other Ambulatory Visit (HOSPITAL_COMMUNITY): Payer: BC Managed Care – PPO | Admitting: Psychology

## 2013-09-27 DIAGNOSIS — F1020 Alcohol dependence, uncomplicated: Secondary | ICD-10-CM

## 2013-09-27 DIAGNOSIS — F32A Depression, unspecified: Secondary | ICD-10-CM

## 2013-09-27 DIAGNOSIS — F329 Major depressive disorder, single episode, unspecified: Secondary | ICD-10-CM

## 2013-09-28 ENCOUNTER — Encounter (HOSPITAL_COMMUNITY): Payer: Self-pay | Admitting: Psychology

## 2013-09-28 NOTE — Progress Notes (Signed)
    Daily Group Progress Note  Program: CD-IOP   Group Time: 1-2:30 pm  Participation Level: Active  Behavioral Response: Sharing  Type of Therapy: Psycho-education Group  Topic: Psycho-Ed: after completing the check-in, the first part of group was spent in a psycho-ed that included a review of PAWS and a review of the relapse process. A new group member was present and he was open about his struggles. He had only been sober for less than 2 weeks and he received excellent feedback and support from his fellow group members. The session proved informative for new members and a good review for current members. During this time, the program director, CK, met with 2 new group members and members discharging or wanting to discuss medications.  Group Time: 2:45-4pm  Participation Level: Active  Behavioral Response: Sharing  Type of Therapy: Process Group  Topic: Process/Graduation: the second half of group included process, where members shared about current issues and concerns. New struggles or challenges were also shared by the newer group members. As the session came to an end, a graduation ceremony was held to honor a member who was moving on after successfully completing the program. There were kind words of thanks and hope shared by all and it proved to be a touching ending to the session and the week.   Summary: The patient was engaged and attentive in the discussion on PAWS. He agreed that it was difficult to get his thoughts together clearly and he had to be patient with himself. At work he has a lot of time working by himself so that is good at this time. The patient reported he will attend two meetings this weekend and plans to spend most of his time at home working around the house. He wished the graduating member good luck and encouraged him to remain committed to his recovery. The patient made some good comments and responded well to this intervention. His sobriety date remains  3/16.    Family Program: Family present? No   Name of family member(s):   UDS collected: No Results:   AA/NA attended?: Yes,  Thursday evening AA meeting  Sponsor?: No   Aydan Phoenix, LCAS

## 2013-09-30 ENCOUNTER — Encounter (HOSPITAL_COMMUNITY): Payer: Self-pay | Admitting: Psychology

## 2013-09-30 ENCOUNTER — Other Ambulatory Visit (HOSPITAL_COMMUNITY): Payer: BC Managed Care – PPO | Admitting: Psychology

## 2013-09-30 DIAGNOSIS — F32A Depression, unspecified: Secondary | ICD-10-CM

## 2013-09-30 DIAGNOSIS — F329 Major depressive disorder, single episode, unspecified: Secondary | ICD-10-CM

## 2013-09-30 DIAGNOSIS — F1020 Alcohol dependence, uncomplicated: Secondary | ICD-10-CM | POA: Diagnosis not present

## 2013-10-01 ENCOUNTER — Encounter (HOSPITAL_COMMUNITY): Payer: Self-pay | Admitting: Psychology

## 2013-10-01 NOTE — Progress Notes (Signed)
    Daily Group Progress Note  Program: CD-IOP   Group Time: 1-2:30 pm  Participation Level: Minimal  Behavioral Response: Sharing  Type of Therapy: Process Group  Topic: Process: the first part off group was spent in process. Members shared about issues and concerns in early recovery. During group today the Investment banker, operational met with new group members as well as current members who wanted to discuss medication issues.   Group Time: 2:45- 4pm  Participation Level: Active  Behavioral Response: Sharing  Type of Therapy: Psycho-education Group  Topic: Communication/Refusal Skills: the second half of group was spent discussing various ways to respond to offers to use. Members were provided a handout on "Communication" and it included different scenarios where individuals are offered or pressured to drink or drug. A discussion followed with members responding to these scenarios and offering their own thoughts on how to respond. Members also volunteered to role play situations where they might be tempted. The importance of practicing and articulating various ways to refuse offers was emphasized.   Summary: The patient reported he had attend an [place] meeting last night. It had been a good one and he was trying to make a decision about who to serve as his temporary sponsor. The patient admitted he was very tired since he has been working full-time and also coming to group. He pointed this out in the process session. When asked about self-care, the patient admitted he doesn't do much for himself, but keeps constantly busy doing things around the house or yard when he is not working. He admitted he couldn't possibly sit still for 5 minutes on the couch and do nothing, as I have asked group members to do. I pointed out that the difficulty in doing this sort of things relates to his inability to deal with his feelings and the discomfort he experiences when he does feel. The patient shared ideas about how  he might respond to certain situations and what he could do to refuse an offer of a drink. He agreed with another member that under some circumstances, he just couldn't put himself in the way of temptation because he couldn't resist. The patient remains somewhat withdrawn and shut down, but he has increased 12-step meeting attendance. His sobriety date remains 3/16.   Family Program: Family present? No   Name of family member(s):   UDS collected: No Results:   AA/NA attended?: YesWednesday  Sponsor?: No   Eren Ryser, LCAS

## 2013-10-02 ENCOUNTER — Other Ambulatory Visit (HOSPITAL_COMMUNITY): Payer: BC Managed Care – PPO | Admitting: Psychology

## 2013-10-02 ENCOUNTER — Encounter (HOSPITAL_COMMUNITY): Payer: Self-pay | Admitting: Psychology

## 2013-10-02 ENCOUNTER — Encounter (HOSPITAL_COMMUNITY): Payer: Self-pay

## 2013-10-02 DIAGNOSIS — F1020 Alcohol dependence, uncomplicated: Secondary | ICD-10-CM

## 2013-10-02 NOTE — Progress Notes (Signed)
    Daily Group Progress Note  Program: CD-IOP   Group Time: 1-2:30 pm  Participation Level: Active  Behavioral Response: Appropriate and Sharing  Type of Therapy: Process Group  Topic: Group Process: the first part of group was spent in process.  A new group member was present and he introduced himself. Members shared about the past weekend and any issues, concerns or challenges that had presented themselves. There was good feedback among members and support for the new group member. Drug tests were collected from a number of members.  Group Time: 2:45- 4pm  Participation Level: Minimal  Behavioral Response: Sharing  Type of Therapy: Psycho-education Group  Topic: Core Beliefs: The second half of group was spent in a psycho-ed. Members were provided a handout on Core Beliefs and asked to complete the questions. Members identified some of their core beliefs and they were extremely negative. They included: "I am not lovable, I am a loser, and I am unworthy". The development of core beliefs occurs in childhood and then we take them with [place] and they color how interaction and perceptions with the world as an adult. Members talked about things that had occurred in their youth and how those perceptions have affected them today. There were some very powerful and emotional disclosures among group members and the session provided them with good insight into these distorted core beliefs. The next session will provide members with the skills and tools to challenge these negative views.   Summary: The patient reported he had had a good weekend. He noted that his S/O had screamed at one pont and there was a large snake behind the sofa. The patient admitted it was a little alarming and he walked around the house trying to find a place where it would have gotten in. He reported he had attended 2 AA meetings over the weekend. He had taken a little time for himself, but always manages to find things to do  at home. The patient smiled as another member recounted what his wife had told him. This patient admitted that his S/O has told him "you don't need anyone else". The patient was able to relate to the emotional distance that his fellow group member's wife complained about. Another member challenged this patient and told him that he had not shared anything of himself to the group and she doesn't really know anything about him. The patient admitted this, but also wondered what he could tell the group about himself? He is meeting the requirements of the program, but the patient is very emotionally distant to the group and, most likely, to himself. His sobriety date remains 3/16.    Family Program: Family present? No   Name of family member(s):   UDS collected: No Results:   AA/NA attended?: YesSaturday and Sunday  Sponsor?: Yes   [name] Brown, LCAS

## 2013-10-03 NOTE — Progress Notes (Unsigned)
    Daily Group Progress Note  Program: CD-IOP   Group Time: 1:00-2:30pm  Participation Level: Active  Behavioral Response: Appropriate  Type of Therapy: Process Group  Topic: The first half of group was spent in process.  Group members checked in by sharing their names and sobriety dates and then counselor led a 15-minute guided relaxation exercise.  Group members then shared about how they were doing in early recovery, and discussed issues such as PAWS symptoms, managing cravings, and identifying triggers.  A former group member stopped by and shared briefly about her experience with recovery.     Group Time: 2:45-4:00pm  Participation Level: Active  Behavioral Response: Appropriate  Type of Therapy: Psycho-education Group  Topic: Counselor continued the discussion on core beliefs from the previous group session.  She presented the concept of automatic thoughts and explained how they can be used to better understand core beliefs.  Group members provided examples of automatic thoughts and discussed how they reflected their core beliefs about themselves, others, and the world.  They also discussed how they could use alternate thoughts to challenge their automatic thoughts when they arise.   Summary: Patient's sobriety date remains 3/16.  He connected with a newer group member who was experiencing symptoms of PAWS by sharing a recent story of his own about [place].  During the psychoeducation group, he shared that his automatic thoughts when approached by others usually involve suspicion about that person's motives.  He was uncertain about how those thoughts might reflect a core belief, but spent time considering what it might be.   Family Program: Family present? No   Name of family member(s): n/a  UDS collected: No Results: n/a  AA/NA attended?: [place] and Tuesday  Sponsor?: Yes   [facility]
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
## 2013-10-04 ENCOUNTER — Other Ambulatory Visit (HOSPITAL_COMMUNITY): Payer: BC Managed Care – PPO

## 2013-10-07 ENCOUNTER — Other Ambulatory Visit (HOSPITAL_COMMUNITY): Payer: BC Managed Care – PPO | Admitting: Psychology

## 2013-10-07 DIAGNOSIS — F1020 Alcohol dependence, uncomplicated: Secondary | ICD-10-CM

## 2013-10-07 DIAGNOSIS — F329 Major depressive disorder, single episode, unspecified: Secondary | ICD-10-CM

## 2013-10-07 DIAGNOSIS — F32A Depression, unspecified: Secondary | ICD-10-CM

## 2013-10-09 ENCOUNTER — Other Ambulatory Visit (HOSPITAL_COMMUNITY): Payer: BC Managed Care – PPO | Admitting: Psychology

## 2013-10-09 ENCOUNTER — Encounter (HOSPITAL_COMMUNITY): Payer: Self-pay

## 2013-10-09 DIAGNOSIS — F1020 Alcohol dependence, uncomplicated: Secondary | ICD-10-CM

## 2013-10-10 ENCOUNTER — Encounter (HOSPITAL_COMMUNITY): Payer: Self-pay | Admitting: Psychology

## 2013-10-10 NOTE — Progress Notes (Signed)
[name] Brown is a 48 y.o. male patient seeking treatment for alcohol dependence in the CDIOP.  This was his 7th and final individual session as part of that program.  Counselor opened session by informing client that his 24th session would be on Monday, April 27th, and he agreed that he would be prepared to graduate from the program on that date.  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  He reported feeling tired, stating that his busy schedule of CDIOP, work, and [place] meetings was beginning to take a toll on him. He shared that he was feeling ready to complete the program.  Counselor inquired about what client had learned from his time in [place], and client reported several benefits and new insights.  He reported that he has acquired many new skills for managing thoughts of drinking, and that "playing the tape out" has been especially helpful.  He shared that he has gained new insight about himself, and is aware that there is much more for him to explore and figure out.  He also reported that he understands better now the importance of having community support and using good communication skills to ask for help.  Client reported that the most significant thing he gained from the program was an understanding that his previous period of sobriety was a "dry drunk" (he called it his "undrunkenness"), and that he needed to work a program through [employer] in order to maintain sobriety over the long-term.  Counselor also inquired about areas for further growth, and client reported that reaching out to others is an area he needs to work on.  He shared plans to make practice phone calls and to stay in better touch with his sponsor.  He also shared that "getting to know [himself]" was an important next step, noting that he expected to learn more about himself  through going through the steps and "thinking things through."  Counselor and client reviewed the discharge plan together, and client requested referrals for a primary care physicians and counselors in his area, which will be provided prior to his completion of the program.  Counselor closed session by expressing her appreciation for the work client has done.   [name], [place]

## 2013-10-10 NOTE — Progress Notes (Signed)
    Daily Group Progress Note  Program: CD-IOP   Group Time: 1-2:30 pm  Participation Level: Minimal  Behavioral Response: Appropriate  Type of Therapy: Psycho-education Group  Topic: Psycho-Ed: the first part of group was spent in a psycho-ed session with a [facility] pharmacist, EP. She offered a presentation on the various categories of drugs. The pharmacist explained the effects of the drugs on one's brain and the subsequent withdrawal symptoms that are experienced in early recovery. There were many questions from group members and the session was very lively and proved to be a very informative session for the "laymen's" in the program. The group agreed that the presentation was very helpful.  Group Time: 2:45- 4pm  Participation Level: Active  Behavioral Response: Sharing  Type of Therapy: Process Group  Topic: Group Process: the second part of group was spent in process. Members shared about the past weekend and concerns and challenges that presented themselves. Members had varied experiences this weekend, but most had been active, but challenged. A new member was present today and she introduced herself to the group members. The group provided good feedback to the new member and her experiences were validated. There was good exchange and sharing among members.   Summary: The patient was attentive, but quiet during the presentation by the pharmacist. When asked about his absence on Friday, the patient reported he had been at home all day working on a leaking water pipe. I had misinterpreted his phone message on Friday and assumed he had been at work and been unable to leave for the group. The patient's explanation was very questionable and more than one group member raised their eyes over his explained absence. One member pointed out this seemed like a lame excuse and I questioned whether he had drank on Thursday and just didn't want to come in. the patient shrugged and explained he had not  used anything and had addressed a serious leak at this home that took all day. He did provided a sample for a drug test and the results should be back by the next group session. His sobriety date remains 3/16.    Family Program: Family present? No   Name of family member(s):   UDS collected: Yes Results: negative  AA/NA attended?: YesSaturday  Sponsor?: Yes   Alberta Lenhard, LCAS

## 2013-10-10 NOTE — Progress Notes (Unsigned)
    Daily Group Progress Note  Program: CD-IOP   Group Time: 1:00-2:30pm  Participation Level: Active  Behavioral Response: Appropriate  Type of Therapy: Process Group  Topic: The first half of group was spent with a group speaker, who shared his recovery story with the group.  He explained the effects that drug use had on his life for many years, and then described his process of recovery and the aspects of recovery that were most important to him.  The counselor also encouraged him to share about the importance of sponsorship, which he did. The group was very appreciative of his story and many group members reported being very inspired by him.     Group Time: 2:45-4:00pm  Participation Level: Active  Behavioral Response: Appropriate  Type of Therapy: Psycho-education Group  Topic: The second half of group was spent discussing the story shared by the guest speaker.  Group members shared their reactions to his story and spent time discussing the reasons that having a sponsor was important to them.  They shared about the various requirements their sponsors have for them and discussed the process of choosing a sponsor.  The final half hour of group was spent in process, and several group members shared about how they were doing in their recovery.   Summary: Patient's sobriety date remains 3/16.  He was attentive and interested during the guest speaker's presentation.  During the discussion on sponsorship, he reported that he had been "playing phone tag" with his sponsor recently, but felt supported by him. He shared that he is aware he needs to practice making phone calls and reaching out to others.   Family Program: Family present? No   Name of family member(s): n/a  UDS collected: No Results: n/a  AA/NA attended?: YesMonday  Sponsor?: Yes   [facility]

## 2013-10-11 ENCOUNTER — Other Ambulatory Visit (HOSPITAL_COMMUNITY): Payer: BC Managed Care – PPO | Admitting: Psychology

## 2013-10-11 DIAGNOSIS — F329 Major depressive disorder, single episode, unspecified: Secondary | ICD-10-CM

## 2013-10-11 DIAGNOSIS — F1020 Alcohol dependence, uncomplicated: Secondary | ICD-10-CM | POA: Diagnosis not present

## 2013-10-11 DIAGNOSIS — F32A Depression, unspecified: Secondary | ICD-10-CM

## 2013-10-13 ENCOUNTER — Encounter (HOSPITAL_COMMUNITY): Payer: Self-pay | Admitting: Psychology

## 2013-10-13 NOTE — Progress Notes (Signed)
    Daily Group Progress Note  Program: CD-IOP   Group Time: 1-2:30 pm  Participation Level: Active  Behavioral Response: Sharing  Type of Therapy: Process Group  Topic: Process/Guided Relaxation: the first part of group was spent in process. Members shared about current issues and concerns. Two new group members were present and they introduced themselves during this part of group. They received good feedback and support from their new group members.  Group Time: 2:45-4 pm  Participation Level: Minimal  Behavioral Response: Sharing  Type of Therapy: Psycho-education Group  Topic: Wheel of Life: the second half of group was spent in a psycho-ed using the Wheel of Life handout. Group members were provided with a handout with a circle in the center. The circle included 8 segments, each of which represented different important elements of life.  The handout required members to identify how satisfied or fulfilled they are in each category. The exercise allows the participant to see how his/her wheel looks relative to balance as well as challenging the person to describe their wheel to their fellow group members. The session ended with a graduation ceremony and farewell to [facility], the counseling intern who has been here in the CD-IOP since August. It was an emotional, but kindhearted farewell.   Summary: The patient reported he was doing okay. He reported he had recently remembered being at the beach for a week with his S/O and she had been drunk all week. He had found it very unpleasant and recognized he could benefit from attending some Al-Anon meetings.  He reported he hadn't been to a meeting since Saturday and felt a little anxious.the patient described himself as having "monkey mind".  He agreed that he would be going to a couple of meetings over the weekend. The patient is graduating on Monday. He is scheduled to go to the beach for a week-long vacation and since he is just one session short  of 24, we agreed it would be good to send him off on Monday. He shared little during the wheel exercise and ran out of time before the session ended. He shared some kind words with the intern who was departing. The patient's sobriety date is 3/16.   Family Program: Family present? No   Name of family member(s):   UDS collected: No Results:  AA/NA attended?: No  Sponsor?: Yes   Koury Roddy, LCAS

## 2013-10-14 ENCOUNTER — Other Ambulatory Visit (HOSPITAL_COMMUNITY): Payer: BC Managed Care – PPO | Admitting: Psychology

## 2013-10-14 DIAGNOSIS — F1020 Alcohol dependence, uncomplicated: Secondary | ICD-10-CM | POA: Diagnosis not present

## 2013-10-15 ENCOUNTER — Encounter (HOSPITAL_COMMUNITY): Payer: Self-pay | Admitting: Psychology

## 2013-10-15 NOTE — Progress Notes (Signed)
    Daily Group Progress Note  Program: CD-IOP   Group Time: 1-2:30 pm  Participation Level: Minimal  Behavioral Response: Appropriate  Type of Therapy: Process Group  Topic: Group Process: the first part of group was spent in process. Members shared about the past weekend and any concerns or obstacles to their ongoing sobriety. On member admitted she had taken 2 pills last Thursday evening. A friend had been over and provided them to her because she was so stressed out and anxious. This relapse was discussed and the seriousness of taking other people's medications reiterated. There was good disclosure and feedback among group members.  Group Time: 2:45- 4pm  Participation Level: Active  Behavioral Response: Appropriate and Sharing  Type of Therapy: Psycho-education Group  Topic: Relapse: the Four Most common Conditions. The second part of group was spent in a psycho-ed on relapse. A handout was provided with the four most common conditions or circumstances that lead to relapse. Members were asked to shared examples of all four of these conditions. Almost everyone had experienced each of the conditions in some way. Identifying strategies and making plans to address these conditions was discussed at length. The session was lively with good feedback among members.   Summary: The patient reported he had attended a Saturday morning AA meeting. He also noted that he had taken a nap later in the day because he didn't feel good. The patient reported he had also made some chowchow. He had enjoyed preparing this [place] condiment and he and his S/O had cooked pinto beans to accompany the chow chow. The graduation today was for this patient and there were kind words spoken and success and continued sobriety wished for him. The patient reported he had enjoyed the program and felt very fortunate to have been here with so many good people. He admitted he wasn't a big talker, but felt as if he had  certainly shared more than he did with most people. The patient had been in treatment before and he admitted that he recognized and accepted the chronic nature of his disease. He recounted when he first arrived here for an orientation and how shaky and ill he felt. The patient noted that I had suggested he needed detox and had accompanied him to the [facility] ED. He had stayed there for a  few days and returned the next week to complete the orientation and begin the group. He was going to the beach for a week and the group wished him well. He leaves the program with a sobriety date of 3/16.     Family Program: Family present? No   Name of family member(s):   UDS collected: No Results:   AA/NA attended?: YesSaturday  Sponsor?: Yes   Colisha Redler, LCAS

## 2013-10-16 ENCOUNTER — Other Ambulatory Visit (HOSPITAL_COMMUNITY): Payer: BC Managed Care – PPO

## 2013-10-18 ENCOUNTER — Other Ambulatory Visit (HOSPITAL_COMMUNITY): Payer: BC Managed Care – PPO | Attending: Psychiatry

## 2013-10-21 ENCOUNTER — Other Ambulatory Visit (HOSPITAL_COMMUNITY): Payer: BC Managed Care – PPO

## 2013-10-23 ENCOUNTER — Other Ambulatory Visit (HOSPITAL_COMMUNITY): Payer: BC Managed Care – PPO

## 2013-10-25 ENCOUNTER — Other Ambulatory Visit (HOSPITAL_COMMUNITY): Payer: BC Managed Care – PPO

## 2013-10-28 ENCOUNTER — Other Ambulatory Visit (HOSPITAL_COMMUNITY): Payer: BC Managed Care – PPO

## 2013-10-30 ENCOUNTER — Other Ambulatory Visit (HOSPITAL_COMMUNITY): Payer: BC Managed Care – PPO

## 2013-11-01 ENCOUNTER — Other Ambulatory Visit (HOSPITAL_COMMUNITY): Payer: BC Managed Care – PPO

## 2013-11-04 ENCOUNTER — Other Ambulatory Visit (HOSPITAL_COMMUNITY): Payer: BC Managed Care – PPO

## 2013-11-06 ENCOUNTER — Other Ambulatory Visit (HOSPITAL_COMMUNITY): Payer: BC Managed Care – PPO

## 2013-11-08 ENCOUNTER — Other Ambulatory Visit (HOSPITAL_COMMUNITY): Payer: BC Managed Care – PPO

## 2013-11-13 ENCOUNTER — Other Ambulatory Visit (HOSPITAL_COMMUNITY): Payer: BC Managed Care – PPO

## 2013-12-17 ENCOUNTER — Encounter (HOSPITAL_COMMUNITY): Payer: Self-pay | Admitting: Psychology

## 2013-12-19 ENCOUNTER — Emergency Department (INDEPENDENT_AMBULATORY_CARE_PROVIDER_SITE_OTHER)
Admission: EM | Admit: 2013-12-19 | Discharge: 2013-12-19 | Disposition: A | Discharge status: Home / Self Care | Payer: BC Managed Care – PPO | Source: Home / Self Care | Attending: Emergency Medicine | Admitting: Emergency Medicine

## 2013-12-19 ENCOUNTER — Encounter: Payer: Self-pay | Admitting: Emergency Medicine

## 2013-12-19 DIAGNOSIS — E785 Hyperlipidemia, unspecified: Secondary | ICD-10-CM | POA: Insufficient documentation

## 2013-12-19 DIAGNOSIS — H10029 Other mucopurulent conjunctivitis, unspecified eye: Secondary | ICD-10-CM

## 2013-12-19 HISTORY — DX: Hyperlipidemia, unspecified: E78.5

## 2013-12-19 MED ORDER — OLOPATADINE HCL 0.2 % OP SOLN: 1.0000 [drp] | Freq: Every day | OPHTHALMIC | Status: DC

## 2013-12-19 MED ORDER — POLYMYXIN B-TRIMETHOPRIM 10000-0.1 UNIT/ML-% OP SOLN: 1.0000 [drp] | OPHTHALMIC | Status: DC

## 2013-12-19 NOTE — ED Provider Notes (Addendum)
CSN: [id number]     Arrival date & time 12/19/13  0800 History   First MD Initiated Contact with Patient 12/19/13 0802     Chief Complaint  Patient presents with  . Eye Problem   (Consider location/radiation/quality/duration/timing/severity/associated sxs/prior Treatment) HPI [name] Brown presents today with an EYE COMPLAINT.  Has tried some OTC eyedrops (Visine, etc) without success.  Location: right > left Onset: 7  Days   Symptoms: Redness: yes Discharge: no Pain: no Photophobia: no Decreased Vision: no URI symptoms: no Itching/Allergy sxs: yes Glaucoma: no Recent eye surgery: no Contact lens use: no  Red Flags Trauma: no Foreign Body: no Vomiting/HA: no Halos around lights: no Chickenpox or zoster: no     Past Medical History  Diagnosis Date  . Gout   . Depression   . Hyperlipidemia    Past Surgical History  Procedure Laterality Date  . No past surgeries     Family History  Problem Relation Age of Onset  . Alcohol abuse Maternal Grandfather   . Hyperlipidemia Father   . Heart failure Father    History  Substance Use Topics  . Smoking status: Never Smoker   . Smokeless tobacco: Never Used  . Alcohol Use: 45.0 oz/week    60 Cans of beer, 15 Shots of liquor per week     Comment: approx 1/2 to 1/5th vodka daily and beers    Review of Systems  All other systems reviewed and are negative.   Allergies  Review of patient's allergies indicates no known allergies.  Home Medications   Prior to Admission medications   Medication Sig Start Date End Date Taking? Authorizing Provider  allopurinol (ZYLOPRIM) 300 MG tablet Take 300 mg by mouth daily.   Yes Historical Provider, MD  b complex vitamins tablet Take 1 tablet by mouth daily.   Yes Historical Provider, MD  citalopram (CELEXA) 20 MG tablet Take 20 mg by mouth daily.   Yes Historical Provider, MD  Multiple Vitamin (MULTIVITAMIN WITH MINERALS) TABS tablet Take 1 tablet by mouth daily.   Yes Historical  Provider, MD  omega-3 acid ethyl esters (LOVAZA) 1 G capsule Take 1 g by mouth daily.   Yes Historical Provider, MD  Red Yeast Rice 600 MG CAPS Take 1 capsule by mouth daily.   Yes Historical Provider, MD  traZODone (DESYREL) 50 MG tablet Take 1 tablet (50 mg total) by mouth at bedtime as needed and may repeat dose one time if needed for sleep. 08/26/13 12/19/13 Yes Dara Hoyer, PA-C  vardenafil (LEVITRA) 20 MG tablet Take 20 mg by mouth daily as needed for erectile dysfunction.   Yes Historical Provider, MD  metoprolol succinate (TOPROL XL) 25 MG 24 hr tablet Take 0.5 tablets (12.5 mg total) by mouth daily. 08/14/13   Wilhoit, MD  Olopatadine HCl 0.2 % SOLN Apply 1 drop to eye daily. 12/19/13   Janeann Forehand, MD  trimethoprim-polymyxin b (POLYTRIM) ophthalmic solution Place 1 drop into both eyes every 4 (four) hours. 12/19/13   Janeann Forehand, MD   BP 138/90  Pulse 100  Temp(Src) 98.4 F (36.9 C) (Oral)  Ht 5\' 7"  (1.702 m)  Wt 176 lb (79.833 kg)  BMI 27.56 kg/m2  SpO2 97% Physical Exam  Nursing note and vitals reviewed. Constitutional: He is oriented to person, place, and time. He appears well-developed and well-nourished.  HENT:  Head: Normocephalic and atraumatic.    Small less than 1 cm area of erythema and slight depression.  Nontender no fluctuance no induration.  Eyes: EOM are normal. Pupils are equal, round, and reactive to light. Right eye exhibits chemosis. Right eye exhibits no discharge and no exudate. No foreign body present in the right eye. Left eye exhibits chemosis. Left eye exhibits no discharge and no exudate. No foreign body present in the left eye. Right conjunctiva is injected. Right conjunctiva has no hemorrhage. Left conjunctiva is injected. Left conjunctiva has no hemorrhage. No scleral icterus.  Neck: Neck supple.  Cardiovascular: Regular rhythm and normal heart sounds.   Pulmonary/Chest: Effort normal and breath sounds normal. No respiratory  distress.  Neurological: He is alert and oriented to person, place, and time.  Skin: Skin is warm and dry.  Psychiatric: He has a normal mood and affect. His speech is normal.    ED Course  Procedures (including critical care time) Labs Review Labs Reviewed - No data to display  Imaging Review No results found.   MDM   1. Pink eye, unspecified laterality    DDx includes bacterial and allergic conjunctivitis.  Will first give a prescription for Polytrim eyedrops.  If not improving after a few days we'll switch to Pataday eyedrops which I also called in.  If still not improving, and he is to followup with an optometrist to have an eye evaluation.  Good hand hygiene.  Follow up PCP as directed.  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  I would like him to see a dermatologist or his primary care physician for a biopsy of this.  Patient understands and will followup.  Janeann Forehand, MD 12/19/13 [phone]

## 2013-12-19 NOTE — ED Notes (Signed)
[name] Brown complains of a red right eye for 1 week. Reports no vision problems, fever, chills or sweats.

## 2014-01-01 ENCOUNTER — Encounter: Payer: Self-pay | Admitting: Psychiatry

## 2014-05-13 ENCOUNTER — Encounter (HOSPITAL_COMMUNITY): Payer: Self-pay | Admitting: Psychology

## 2014-05-27 ENCOUNTER — Encounter: Payer: Self-pay | Admitting: Family Medicine

## 2014-05-27 ENCOUNTER — Ambulatory Visit (INDEPENDENT_AMBULATORY_CARE_PROVIDER_SITE_OTHER): Payer: BC Managed Care – PPO | Admitting: Family Medicine

## 2014-05-27 VITALS — BP 142/91 | HR 94 | Ht 67.0 in | Wt 179.0 lb

## 2014-05-27 DIAGNOSIS — F329 Major depressive disorder, single episode, unspecified: Secondary | ICD-10-CM | POA: Diagnosis not present

## 2014-05-27 DIAGNOSIS — F1010 Alcohol abuse, uncomplicated: Secondary | ICD-10-CM

## 2014-05-27 DIAGNOSIS — M109 Gout, unspecified: Secondary | ICD-10-CM | POA: Diagnosis not present

## 2014-05-27 DIAGNOSIS — I10 Essential (primary) hypertension: Secondary | ICD-10-CM | POA: Diagnosis not present

## 2014-05-27 DIAGNOSIS — R945 Abnormal results of liver function studies: Secondary | ICD-10-CM

## 2014-05-27 DIAGNOSIS — R7989 Other specified abnormal findings of blood chemistry: Secondary | ICD-10-CM | POA: Insufficient documentation

## 2014-05-27 DIAGNOSIS — F32A Depression, unspecified: Secondary | ICD-10-CM

## 2014-05-27 MED ORDER — METOPROLOL SUCCINATE ER 25 MG PO TB24: 12.5000 mg | ORAL_TABLET | Freq: Every day | ORAL | Status: DC

## 2014-05-27 MED ORDER — VENLAFAXINE HCL ER 150 MG PO CP24: 150.0000 mg | ORAL_CAPSULE | Freq: Every day | ORAL | Status: DC

## 2014-05-27 MED ORDER — INDOMETHACIN 50 MG PO CAPS: 50.0000 mg | ORAL_CAPSULE | Freq: Three times a day (TID) | ORAL | Status: DC | PRN

## 2014-05-27 NOTE — Progress Notes (Signed)
CC: [name] Brown is a 48 y.o. male is here for Establish Care   Subjective: HPI:  Pleasant 48 year old here to establish care  Patient reports a history of essential hypertension: He's been taking metoprolol 25 mg for an unknown amount of time. There have been no unknown side effects or intolerance. No outside blood pressures to report.  Reports a history of depression that he believes has been going on for the majority of his life. He's been self-medicating with alcohol. He's also had some benefit from citalopram however over the last month has felt that it is becoming less effective. His lost interest in hobbies, he doesn't find many pleasurable activities outside of drinking alcohol,  Trazodone has only provided minimal benefit of sleep disturbance.  Reports a history of gout he believes it has been well over 3 or 4 years since his last attack. He cannot recall where he had the attack at anatomy wise. He has drastic improvement of his pain if he takes indomethacin within a few hours at the onset of the attack. He denies any chronic joint pain or swelling. Is requesting refills on indomethacin  History history of elevated LFTs in the triple digit range while he was taking alcohol on a daily basis for almost all waking hours during the day.  Nor skin or scleral jaundice recently or remotely. He is currently drinking but will not quantify how much he is drinking on a daily basis.  He has a long-standing history of alcohol dependence and abuse. He's been sober for months at a time with the help of intensive outpatient psychiatric therapy however whenever these interventions and within a few weeks he admits to going back to heavily drinking. He is currently drinking to the point where people at his job have noticed. He admits that drinking is tied to his depression and anxiety and that with increasing anxiety at work he's been self-medicating with alcohol. His girlfriend that he lives with his also  what sounds like a "seasonal" alcoholic where she will go for months at a time sober and then months at a time drinking all hours of the day.  Review of Systems - General ROS: negative for - chills, fever, night sweats, weight gain or weight loss Ophthalmic ROS: negative for - decreased vision ENT ROS: negative for - hearing change, nasal congestion, tinnitus or allergies Hematological and Lymphatic ROS: negative for - bleeding problems, bruising or swollen lymph nodes Breast ROS: negative Respiratory ROS: no cough, shortness of breath, or wheezing Cardiovascular ROS: no chest pain or dyspnea on exertion Gastrointestinal ROS: no abdominal pain, change in bowel habits, or black or bloody stools Genito-Urinary ROS: negative for - genital discharge, genital ulcers, incontinence or abnormal bleeding from genitals Musculoskeletal ROS: negative for - joint pain or muscle pain Neurological ROS: negative for - headaches or memory loss Dermatological ROS: negative for lumps, mole changes, rash and skin lesion changes  Past Medical History  Diagnosis Date  . Gout   . Depression   . Hyperlipidemia     Past Surgical History  Procedure Laterality Date  . No past surgeries     Family History  Problem Relation Age of Onset  . Alcohol abuse Maternal Grandfather   . Hyperlipidemia Father   . Heart failure Father     History   Social History  . Marital Status: Single    Spouse Name: N/A    Number of Children: N/A  . Years of Education: N/A   Occupational History  .  Not on file.   Social History Main Topics  . Smoking status: Never Smoker   . Smokeless tobacco: Never Used  . Alcohol Use: 45.0 oz/week    60 Cans of beer, 15 Shots of liquor per week     Comment: approx 1/2 to 1/5th vodka daily and beers  . Drug Use: Yes    Special: Marijuana     Comment: "every now and then"  . Sexual Activity:    Partners: Female   Other Topics Concern  . Not on file   Social History Narrative      Objective: BP 142/91 mmHg  Pulse 94  Ht 5\' 7"  (1.702 m)  Wt 179 lb (81.194 kg)  BMI 28.03 kg/m2  General: Alert and Oriented, No Acute Distress HEENT: Pupils equal, round, reactive to light. Conjunctivae clear.  Moist mucous membranes pharynx unremarkable Lungs: Clear to auscultation bilaterally, no wheezing/ronchi/rales.  Comfortable work of breathing. Good air movement. Cardiac: Regular rate and rhythm. Normal S1/S2.  No murmurs, rubs, nor gallops.   Abdomen: mild obesity Extremities: No peripheral edema.  Strong peripheral pulses.  Mental Status: No depression, anxiety, nor agitation. Skin: Warm and dry.  Assessment & Plan: Knight was seen today for establish care.  Diagnoses and associated orders for this visit:  Alcohol abuse  Essential hypertension - metoprolol succinate (TOPROL XL) 25 MG 24 hr tablet; Take 0.5 tablets (12.5 mg total) by mouth daily. - Lipid panel  Depression - venlafaxine XR (EFFEXOR-XR) 150 MG 24 hr capsule; Take 1 capsule (150 mg total) by mouth daily with breakfast. For anxiety and depression control.  Gout without tophus, unspecified cause, unspecified chronicity, unspecified site - Uric acid  Elevated LFTs - COMPLETE METABOLIC PANEL WITH GFR  Other Orders - indomethacin (INDOCIN) 50 MG capsule; Take 1 capsule (50 mg total) by mouth 3 (three) times daily as needed (gout).    Alcohol abuse: I discussed whether or not he thinks that his girlfriend would be willing to help him with abstaining from alcohol. He's not very optimistic that this is an option therefore holding off on disulfiram reaction medications  That would be administered by her to deter from drinking alcohol since it's unlikely he would actually end up receiving it on a regular basis. We'll try to focus on better anxiety and depression control first. Essential hypertension: uncontrolled, I'm suspicious that he probably is not taking metoprolol since it was last prescribed almost a  year ago for only 60 days therefore refills provided for him to start this again. He has a strong family history of heart disease therefore checking lipid panel today Depression: Uncontrolled stop citalopram switching to Effexor. Gout: Currently controlled checking uric acid. Take indomethacin only on an as-needed basis due to possibility of gastritis especially with alcohol use. Elevated LFTs: Encouraged to stop drinking completely to optimize his health, rechecking LFTs today  45 minutes spent face-to-face during visit today of which at least 50% was counseling or coordinating care regarding: 1. Alcohol abuse   2. Essential hypertension   3. Depression   4. Gout without tophus, unspecified cause, unspecified chronicity, unspecified site   5. Elevated LFTs      Return in about 4 weeks (around 06/24/2014) for Mood Follow Up.

## 2014-05-28 LAB — COMPLETE METABOLIC PANEL WITH GFR
ALBUMIN: 4.1 g/dL (ref 3.5–5.2)
ALK PHOS: 94 U/L (ref 39–117)
ALT: 39 U/L (ref 0–53)
AST: 50 U/L — ABNORMAL HIGH (ref 0–37)
BUN: 7 mg/dL (ref 6–23)
CALCIUM: 9.2 mg/dL (ref 8.4–10.5)
CHLORIDE: 99 meq/L (ref 96–112)
CO2: 28 mEq/L (ref 19–32)
Creat: 0.57 mg/dL (ref 0.50–1.35)
GFR, Est African American: >89 mL/min
GLUCOSE: 101 mg/dL — AB (ref 70–99)
Potassium: 3.9 mEq/L (ref 3.5–5.3)
Sodium: 137 mEq/L (ref 135–145)
Total Bilirubin: 0.8 mg/dL (ref 0.2–1.2)
Total Protein: 7.4 g/dL (ref 6.0–8.3)

## 2014-05-28 LAB — LIPID PANEL
CHOLESTEROL: 153 mg/dL (ref 0–200)
HDL: 40 mg/dL (ref >39)
LDL Cholesterol: 77 mg/dL (ref 0–99)
Total CHOL/HDL Ratio: 3.8 Ratio
Triglycerides: 182 mg/dL — ABNORMAL HIGH (ref <150)
VLDL: 36 mg/dL (ref 0–40)

## 2014-05-28 LAB — URIC ACID: Uric Acid, Serum: 5.3 mg/dL (ref 4.0–7.8)

## 2014-06-24 ENCOUNTER — Ambulatory Visit: Payer: Self-pay | Admitting: Family Medicine

## 2014-07-01 ENCOUNTER — Encounter: Payer: Self-pay | Admitting: Family Medicine

## 2014-07-01 ENCOUNTER — Ambulatory Visit (INDEPENDENT_AMBULATORY_CARE_PROVIDER_SITE_OTHER): Payer: BLUE CROSS/BLUE SHIELD | Admitting: Family Medicine

## 2014-07-01 VITALS — BP 131/87 | HR 90 | Wt 186.0 lb

## 2014-07-01 DIAGNOSIS — F1010 Alcohol abuse, uncomplicated: Secondary | ICD-10-CM | POA: Diagnosis not present

## 2014-07-01 DIAGNOSIS — I10 Essential (primary) hypertension: Secondary | ICD-10-CM | POA: Diagnosis not present

## 2014-07-01 DIAGNOSIS — F329 Major depressive disorder, single episode, unspecified: Secondary | ICD-10-CM

## 2014-07-01 DIAGNOSIS — F32A Depression, unspecified: Secondary | ICD-10-CM

## 2014-07-01 MED ORDER — ESCITALOPRAM OXALATE 10 MG PO TABS: 10.0000 mg | ORAL_TABLET | Freq: Every day | ORAL | Status: DC

## 2014-07-01 NOTE — Progress Notes (Signed)
CC: [name] Brown is a 49 y.o. male is here for Depression   Subjective: HPI:  Follow depression and anxiety: Since taking Effexor 150 mg daily he has not noticed a big improvement with anxiety or depression. He states that anxiety is actually worsened due to job insecurity, his supervisors were recently fired without any warning and he is worried that something might this could happen to him without any warning anytime in the future. Symptoms are present at work and at home moderate in severity other than above nothing seems to make better or worse. Continues to drink most days of the week and has not been able to cut back since starting Effexor. He's noticed that he is having much more vivid dreams now and he will wake up exhausted after these dreams. He denies any fearful dreams or terrible dreams however they're so vivid that he continues to think about them constantly throughout the day and it interferes with job productivity. No thoughts weren't harmed himself or others  Essential hypertension: He decided to stop taking metoprolol because he felt that he did not need it. No outside blood pressures to report. There has not been any chest pain shortness of breath orthopnea nor peripheral edema.   Review Of Systems Outlined In HPI  Past Medical History  Diagnosis Date  . Gout   . Depression   . Hyperlipidemia     Past Surgical History  Procedure Laterality Date  . No past surgeries     Family History  Problem Relation Age of Onset  . Alcohol abuse Maternal Grandfather   . Hyperlipidemia Father   . Heart failure Father     History   Social History  . Marital Status: Single    Spouse Name: N/A    Number of Children: N/A  . Years of Education: N/A   Occupational History  . Not on file.   Social History Main Topics  . Smoking status: Never Smoker   . Smokeless tobacco: Never Used  . Alcohol Use: 45.0 oz/week    60 Cans of beer, 15 Shots of liquor per week     Comment:  approx 1/2 to 1/5th vodka daily and beers  . Drug Use: Yes    Special: Marijuana     Comment: "every now and then"  . Sexual Activity:    Partners: Female   Other Topics Concern  . Not on file   Social History Narrative     Objective: BP 131/87 mmHg  Pulse 90  Wt 186 lb (84.369 kg)  General: Alert and Oriented, No Acute Distress HEENT: Pupils equal, round, reactive to light. Conjunctivae clear.  Moist mucous membranes Lungs: Clear to auscultation bilaterally, no wheezing/ronchi/rales.  Comfortable work of breathing. Good air movement. Cardiac: Regular rate and rhythm. Normal S1/S2.  No murmurs, rubs, nor gallops.   Extremities: No peripheral edema.  Strong peripheral pulses.  Mental Status: Mild depression and anxiety. No agitation. Somewhat flat affect today Skin: Warm and dry.  Assessment & Plan: [name] Brown was seen today for depression.  Diagnoses and associated orders for this visit:  Alcohol abuse  Depression - escitalopram (LEXAPRO) 10 MG tablet; Take 1 tablet (10 mg total) by mouth daily.  Essential hypertension    Depression and anxiety: Uncontrolled chronic condition stopping Effexor, anticipate that he might need a prior authorization therefore documenting here that he has failed trazodone, citalopram and now Effexor XR. Essential hypertension: Controlled no current indication for antihypertensives.   Return in about 4 weeks (  around 07/29/2014) for Mood.

## 2014-07-29 ENCOUNTER — Ambulatory Visit: Payer: Self-pay | Admitting: Family Medicine

## 2014-10-21 ENCOUNTER — Emergency Department (INDEPENDENT_AMBULATORY_CARE_PROVIDER_SITE_OTHER)
Admission: EM | Admit: 2014-10-21 | Discharge: 2014-10-21 | Disposition: A | Discharge status: Home / Self Care | Payer: 59 | Source: Home / Self Care | Attending: Emergency Medicine | Admitting: Emergency Medicine

## 2014-10-21 ENCOUNTER — Encounter: Payer: Self-pay | Admitting: *Deleted

## 2014-10-21 DIAGNOSIS — F10930 Alcohol use, unspecified with withdrawal, uncomplicated: Secondary | ICD-10-CM

## 2014-10-21 DIAGNOSIS — R251 Tremor, unspecified: Secondary | ICD-10-CM

## 2014-10-21 DIAGNOSIS — G250 Essential tremor: Secondary | ICD-10-CM | POA: Diagnosis not present

## 2014-10-21 DIAGNOSIS — F10230 Alcohol dependence with withdrawal, uncomplicated: Secondary | ICD-10-CM

## 2014-10-21 HISTORY — DX: Anxiety disorder, unspecified: F41.9

## 2014-10-21 HISTORY — DX: Alcohol abuse, uncomplicated: F10.10

## 2014-10-21 MED ORDER — DIAZEPAM 5 MG PO TABS: 5.0000 mg | ORAL_TABLET | Freq: Two times a day (BID) | ORAL | Status: DC

## 2014-10-21 NOTE — ED Notes (Signed)
appt sch'ed with Dr. Ileene Rubens for 10/23/14 @ 8:30 ([place]am[place]) and Behavioral Health on 10/22/14 @ 10:30 Maudie Mercury).

## 2014-10-21 NOTE — Discharge Instructions (Signed)
Alcohol Withdrawal Alcohol withdrawal happens when you normally drink alcohol a lot and suddenly stop drinking. Alcohol withdrawal symptoms can be mild to very bad. Mild withdrawal symptoms can include feeling sick to your stomach (nauseous), headache, or feeling irritable. Bad withdrawal symptoms can include shakiness, being very nervous (anxious), and not thinking clearly.  HOME CARE  Join an alcohol support group.  Stay away from people or situations that make you want to drink.  Eat a healthy diet. Eat a lot of fresh fruits, vegetables, and lean meats. GET HELP RIGHT AWAY IF:   You become confused. You start to see and hear things that are not really there.  You feel your heart beating very fast.  You throw up (vomit) blood or cannot stop throwing up. This may be bright red or look like black coffee grounds.  You have blood in your poop (stool). This may be bright red, maroon colored, or black and tarry.  You are lightheaded or pass out (faint).  You develop a fever. MAKE SURE YOU:   Understand these instructions.  Will watch your condition.  Will get help right away if you are not doing well or get worse. Document Released: 11/23/2007 Document Revised: 08/29/2011 Document Reviewed: 11/23/2007 [facility] Patient Information 2015 [place], [place]. This information is not intended to replace advice given to you by your health care provider. Make sure you discuss any questions you have with your health care provider.

## 2014-10-21 NOTE — ED Provider Notes (Signed)
CSN: [id number]     Arrival date & time 10/21/14  1418 History    Patient presents to [facility].  PCP is Dr. Ileene Rubens Chief Complaint  Patient presents with  . Delirium Tremens (DTS)    HPI Mr. Vansickle has a long history of alcoholism. He admits to drinking alcohol to excess for years. He was once in alcohol rehabilitation treatment program years ago, through [facility] and he states that was effective. He admits that up until couple days ago he was drinking alcohol excessively, then tried to quit cold [place] and he started developing alcohol withdrawal symptoms of tremor, agitation. No seizures, syncope, loss of consciousness, or other focal neurologic symptoms. Denies chest pain or shortness of breath.  His chief complaint is requesting short-term prescription of medication to help with alcohol withdrawal symptoms. Past Medical History  Diagnosis Date  . Gout   . Depression   . Hyperlipidemia   . Anxiety   . Alcohol abuse    Past Surgical History  Procedure Laterality Date  . No past surgeries     Family History  Problem Relation Age of Onset  . Alcohol abuse Maternal Grandfather   . Hyperlipidemia Father   . Heart failure Father    History  Substance Use Topics  . Smoking status: Never Smoker   . Smokeless tobacco: Never Used  . Alcohol Use: 45.0 oz/week    60 Cans of beer, 15 Shots of liquor per week     Comment: approx 1/2 to 1/5th vodka daily and beers    Review of Systems Remainder of Review of Systems negative for acute change except as noted in the HPI.  Allergies  Review of patient's allergies indicates no known allergies.  Home Medications   Prior to Admission medications   Medication Sig Start Date End Date Taking? Authorizing Provider  allopurinol (ZYLOPRIM) 300 MG tablet Take 300 mg by mouth daily.    Historical Provider, MD  diazepam (VALIUM) 5 MG tablet Take 1 tablet (5 mg total) by mouth 2 (two) times daily. Take 1 or 2 tablets every 8  hours as needed for tremor, anxiety, withdrawal symptoms 10/21/14   Jacqulyn Cane, MD  escitalopram (LEXAPRO) 10 MG tablet Take 1 tablet (10 mg total) by mouth daily. 07/01/14   Sean Hommel, DO  indomethacin (INDOCIN) 50 MG capsule Take 1 capsule (50 mg total) by mouth 3 (three) times daily as needed (gout). 05/27/14   Marcial Pacas, DO  Multiple Vitamin (MULTIVITAMIN WITH MINERALS) TABS tablet Take 1 tablet by mouth daily.    Historical Provider, MD  Olopatadine HCl 0.2 % SOLN Apply 1 drop to eye daily. 12/19/13   Janeann Forehand, MD  Omega-3 Fatty Acids (FISH OIL PO) Take by mouth.    Historical Provider, MD   BP 149/88 mmHg  Pulse 92  Temp(Src) 98.1 F (36.7 C) (Oral)  Resp 18  Ht 5\' 7"  (1.702 m)  Wt 186 lb (84.369 kg)  BMI 29.12 kg/m2  SpO2 96% Physical Exam  Constitutional: He is oriented to person, place, and time. He appears well-developed and well-nourished. No distress.  Alert, cooperative. He has mild resting tremor. Mildly anxious.  HENT:  Head: Normocephalic and atraumatic.  Eyes: Conjunctivae and EOM are normal. Pupils are equal, round, and reactive to light. No scleral icterus.  Neck: Normal range of motion.  Cardiovascular: Normal rate and regular rhythm.   Pulmonary/Chest: Effort normal.  Abdominal: He exhibits no distension.  Musculoskeletal: Normal range of motion.  Neurological: He is alert and oriented to person, place, and time. He displays normal reflexes. No cranial nerve deficit. He exhibits normal muscle tone. Coordination normal.  Skin: Skin is warm. No rash noted.  Psychiatric: He has a normal mood and affect.  Nursing note and vitals reviewed.   ED Course  Procedures (including critical care time) Labs Review Labs Reviewed - No data to display  Imaging Review No results found.   MDM   1. Tremor, physiological   2. Alcohol withdrawal, uncomplicated    Over 20 minutes spent, greater than 50% of the time spent for counseling and coordination of  care. Risks, benefits, alternatives discussed. I agreed to prescribe enough diazepam for a total dosage of 10 mg every 8 hours until he is seen by Dr. Ileene Rubens. We made an appointment for patient to see Dr. Ileene Rubens on Thursday 10/23/2014 at 8:30 AM. Written details about this appointment given to patient. We also were able to make an appointment for patient to be seen at behavioral health tomorrow, 10/22/2014  New Prescriptions   DIAZEPAM (VALIUM) 5 MG TABLET    Take 1 tablet (5 mg total) by mouth 2 (two) times daily. Take 1 or 2 tablets every 8 hours as needed for tremor, anxiety, withdrawal symptoms   An After Visit Summary was printed and given to the patient. Note written to excuse from work 5/2 through 10/23/2014. I explained that any further medical evaluation/treatment and work notes would need to be done by his PCP. Precautions discussed. Red flags discussed.--- Go to emergency room if any red flag Questions invited and answered. Patient voiced understanding and agreement.    Jacqulyn Cane, MD 10/21/14 1945

## 2014-10-21 NOTE — ED Notes (Addendum)
Pt c/o DTS after trying to stop drinking alcohol. He admits to being an alcoholic and has paperwork for a rehab facility. He reports his last drink was 1 hour ago.

## 2014-10-22 ENCOUNTER — Ambulatory Visit (HOSPITAL_COMMUNITY): Payer: Self-pay | Admitting: Licensed Clinical Social Worker

## 2014-10-22 ENCOUNTER — Telehealth: Payer: Self-pay | Admitting: *Deleted

## 2014-10-23 ENCOUNTER — Ambulatory Visit: Payer: Self-pay | Admitting: Family Medicine

## 2014-10-27 ENCOUNTER — Ambulatory Visit: Payer: Self-pay | Admitting: Family Medicine

## 2014-10-28 ENCOUNTER — Ambulatory Visit (INDEPENDENT_AMBULATORY_CARE_PROVIDER_SITE_OTHER): Payer: 59 | Admitting: Family Medicine

## 2014-10-28 ENCOUNTER — Encounter: Payer: Self-pay | Admitting: Family Medicine

## 2014-10-28 VITALS — BP 134/84 | HR 86 | Wt 182.0 lb

## 2014-10-28 DIAGNOSIS — K759 Inflammatory liver disease, unspecified: Secondary | ICD-10-CM

## 2014-10-28 DIAGNOSIS — F1010 Alcohol abuse, uncomplicated: Secondary | ICD-10-CM | POA: Diagnosis not present

## 2014-10-28 DIAGNOSIS — F10939 Alcohol use, unspecified with withdrawal, unspecified: Secondary | ICD-10-CM

## 2014-10-28 DIAGNOSIS — F411 Generalized anxiety disorder: Secondary | ICD-10-CM | POA: Diagnosis not present

## 2014-10-28 DIAGNOSIS — F10239 Alcohol dependence with withdrawal, unspecified: Secondary | ICD-10-CM

## 2014-10-28 DIAGNOSIS — A630 Anogenital (venereal) warts: Secondary | ICD-10-CM

## 2014-10-28 LAB — COMPLETE METABOLIC PANEL WITH GFR
ALT: 130 U/L — AB (ref 0–53)
AST: 139 U/L — ABNORMAL HIGH (ref 0–37)
Albumin: 4.2 g/dL (ref 3.5–5.2)
Alkaline Phosphatase: 76 U/L (ref 39–117)
BILIRUBIN TOTAL: 0.8 mg/dL (ref 0.2–1.2)
BUN: 10 mg/dL (ref 6–23)
CALCIUM: 9.3 mg/dL (ref 8.4–10.5)
CHLORIDE: 100 meq/L (ref 96–112)
CO2: 26 mEq/L (ref 19–32)
Creat: 0.56 mg/dL (ref 0.50–1.35)
GLUCOSE: 105 mg/dL — AB (ref 70–99)
Potassium: 4 mEq/L (ref 3.5–5.3)
SODIUM: 137 meq/L (ref 135–145)
TOTAL PROTEIN: 7.8 g/dL (ref 6.0–8.3)

## 2014-10-28 MED ORDER — PODOFILOX 0.5 % EX GEL: CUTANEOUS | Status: DC

## 2014-10-28 MED ORDER — CLONAZEPAM 0.5 MG PO TABS: 0.5000 mg | ORAL_TABLET | Freq: Two times a day (BID) | ORAL | Status: DC | PRN

## 2014-10-28 NOTE — Progress Notes (Signed)
CC: [name] Brown is a 49 y.o. male is here for f/u alcohol abuse/anxiety   Subjective: HPI:  In late April he began drinking on a daily basis when he went on vacation this escalated to drinking throughout the day, when he returned home he tried to stop this binge by quitting cold [place] and developed tremor and anxiousness beyond his baseline anxiousness. Symptoms are described as severe in severity. He tried taking Valium prescribed by urgent care but this did not seem to help. He was seen at a local emergency room and given Ativan IV and then a small prescription to take home. He was taking Ativan once a day and believes that it was working to help both with anxiety and tremor. He is back to drinking 1-2 beers a day and reached out to his Alcoholics Anonymous sponsor but has not had the motivation to go to a meeting yet. He denies any withdrawal symptoms other than persistent anxiety. Despite taking Lexapro on a daily basis he did not feel any different respect to anxiety. He reports feelings of panic and feeling overwhelmed about family obligations.  While he was at a local emergency room last week ALT was 236 and AST 434. There've been no gastrointestinal complaints last week or today  He has a growth on the bottom of the scrotum that seems to be enlarging. It is painless but he is worried that it could be something serious. He's never had this before and noticed it about a week ago. He denies any other genitourinary complaints    Review Of Systems Outlined In HPI  Past Medical History  Diagnosis Date  . Gout   . Depression   . Hyperlipidemia   . Anxiety   . Alcohol abuse     Past Surgical History  Procedure Laterality Date  . No past surgeries     Family History  Problem Relation Age of Onset  . Alcohol abuse Maternal Grandfather   . Hyperlipidemia Father   . Heart failure Father     History   Social History  . Marital Status: Single    Spouse Name: N/A  . Number of  Children: N/A  . Years of Education: N/A   Occupational History  . Not on file.   Social History Main Topics  . Smoking status: Never Smoker   . Smokeless tobacco: Never Used  . Alcohol Use: 45.0 oz/week    60 Cans of beer, 15 Shots of liquor per week     Comment: approx 1/2 to 1/5th vodka daily and beers  . Drug Use: Yes    Special: Marijuana     Comment: "every now and then"  . Sexual Activity:    Partners: Female   Other Topics Concern  . Not on file   Social History Narrative     Objective: BP 134/84 mmHg  Pulse 86  Wt 182 lb (82.555 kg)  Vital signs reviewed. General: Alert and Oriented, No Acute Distress HEENT: Pupils equal, round, reactive to light. Conjunctivae clear.  External ears unremarkable.  Moist mucous membranes. Lungs: Clear and comfortable work of breathing, speaking in full sentences without accessory muscle use. Cardiac: Regular rate and rhythm.  Neuro: CN II-XII grossly intact, gait normal. Extremities: No peripheral edema.  Strong peripheral pulses.  Mental Status: No depression, anxiety, nor agitation. Logical though process. Skin: Warm and dry. On the bottom of the left scrotum there are 4 pedunculated cauliflower appearing lesions, fleshy colored noninflamed. These range between 2 mm  in diameter and 4 mm in diameter.  Assessment & Plan: [name] Brown was seen today for f/u alcohol abuse/anxiety.  Diagnoses and all orders for this visit:  Alcohol abuse  Alcohol withdrawal, with unspecified complication  Anxiety state Orders: -     clonazePAM (KLONOPIN) 0.5 MG tablet; Take 1 tablet (0.5 mg total) by mouth 2 (two) times daily as needed for anxiety (Must not drink alcohol while using this medication.).  Hepatitis Orders: -     COMPLETE METABOLIC PANEL WITH GFR  Genital warts  Other orders -     podofilox (CONDYLOX) 0.5 % gel; Apply twice a day to growths for three days then rest for four days.  Repeat weekly until growths are gone.   Alcohol  abuse: Recommended 100% cessation but applauded his success with cutting back, encouraged him to seek out the help that he proceed from Alcoholics Anonymous in the past. We both feel that if we can reduce his anxiety he will be less driven to drink alcohol. Anxiety: Uncontrolled chronic condition no benefit from ssri nor snri. I provided him with as needed clonazepam to use only if he is not consuming any alcohol. Hepatitis: Asymptomatic, repeating liver enzymes to ensure that they're downtrending. Genital warts: Discussed treatment with Condylox which may take up to 4 weeks.  40 minutes spent face-to-face during visit today of which at least 50% was counseling or coordinating care regarding: 1. Alcohol abuse   2. Alcohol withdrawal, with unspecified complication   3. Anxiety state   4. Hepatitis   5. Genital warts      Return in about 4 weeks (around 11/25/2014) for Mood Follow Up.

## 2014-11-11 ENCOUNTER — Telehealth: Payer: Self-pay | Admitting: *Deleted

## 2014-11-11 NOTE — Telephone Encounter (Signed)
Just wanted to inform you of the conversation today with [name] Brown.  He called in multiple times today stating that he's "got problems" and that his medicine (clonazepam) isn't working as well as it was before.  He denied any feelings of harming himself or anyone else.  I called downstairs to behavioral health for a crisis visit but they didn't have a provider today nor any available appts until June.  I got back on the phone with [name] to try to get him to go to the ED.  He stated that doing that would be hard for him money wise.  I strongly advised him that it would be in his best interest to be seen today as opposed to any later.  He chose to make an appt with you first thing Tues morning even though I told him that there wasn't much more that you were able to do for him.  I made him promise me multiple times that if his feelings changed in the slightest bit that he would go to the ED & he agreed.  I also encouraged him to call me back if he needed me.

## 2014-11-12 ENCOUNTER — Telehealth: Payer: Self-pay | Admitting: *Deleted

## 2014-11-12 NOTE — Telephone Encounter (Signed)
Thank you very much, I will remind him of this appt when I see him next week.

## 2014-11-12 NOTE — Telephone Encounter (Signed)
I spoke with [name] Brown this morning and she has scheduled the pt for June 9th at 8:30.  She did advise me that if he does not show up they won't see him again.  He had 12 no shows to [facility] and one no show with them downstairs.

## 2014-11-18 ENCOUNTER — Ambulatory Visit: Payer: Self-pay | Admitting: Family Medicine

## 2014-11-21 ENCOUNTER — Encounter: Payer: Self-pay | Admitting: Family Medicine

## 2014-11-21 ENCOUNTER — Ambulatory Visit (INDEPENDENT_AMBULATORY_CARE_PROVIDER_SITE_OTHER): Payer: 59 | Admitting: Family Medicine

## 2014-11-21 VITALS — BP 134/89 | HR 87 | Wt 182.0 lb

## 2014-11-21 DIAGNOSIS — F411 Generalized anxiety disorder: Secondary | ICD-10-CM | POA: Diagnosis not present

## 2014-11-21 DIAGNOSIS — F1010 Alcohol abuse, uncomplicated: Secondary | ICD-10-CM | POA: Diagnosis not present

## 2014-11-21 MED ORDER — QUETIAPINE FUMARATE ER 50 MG PO TB24: 50.0000 mg | ORAL_TABLET | Freq: Every day | ORAL | Status: DC

## 2014-11-21 MED ORDER — CHLORDIAZEPOXIDE HCL 25 MG PO CAPS: 25.0000 mg | ORAL_CAPSULE | Freq: Three times a day (TID) | ORAL | Status: DC | PRN

## 2014-11-21 NOTE — Progress Notes (Signed)
CC: [name] Brown is a 49 y.o. male is here for Anxiety   Subjective: HPI:  Follow-up anxiety: Symptoms are still present and fluctuate from absent to severe in severity. Symptoms are improved now that his ex-girlfriend has moved out of the house. Symptoms are improved the morning he is able to find peace and quiet at home. Symptoms are worse with recent news that his job has lost some contracts and there may be layoffs in the near future. He is getting some benefit from clonazepam however he states that lost its effect for the most part. He continues to drink most days out of the week and reports he's had a couple days where he didn't drink at all. He has gone to a few AA meetings which he thinks has helped as well. He has cravings for alcohol on daily basis. Anxiety is somewhat improved with drinking alcohol however only while he is intoxicated and the next morning he reports his anxiety is worse.      Review Of Systems Outlined In HPI  Past Medical History  Diagnosis Date  . Gout   . Depression   . Hyperlipidemia   . Anxiety   . Alcohol abuse     Past Surgical History  Procedure Laterality Date  . No past surgeries     Family History  Problem Relation Age of Onset  . Alcohol abuse Maternal Grandfather   . Hyperlipidemia Father   . Heart failure Father     History   Social History  . Marital Status: Single    Spouse Name: N/A  . Number of Children: N/A  . Years of Education: N/A   Occupational History  . Not on file.   Social History Main Topics  . Smoking status: Never Smoker   . Smokeless tobacco: Never Used  . Alcohol Use: 45.0 oz/week    60 Cans of beer, 15 Shots of liquor per week     Comment: approx 1/2 to 1/5th vodka daily and beers  . Drug Use: Yes    Special: Marijuana     Comment: "every now and then"  . Sexual Activity:    Partners: Female   Other Topics Concern  . Not on file   Social History Narrative     Objective: BP 134/89 mmHg  Pulse  87  Wt 182 lb (82.555 kg)  Vital signs reviewed. General: Alert and Oriented, No Acute Distress HEENT: Pupils equal, round, reactive to light. Conjunctivae clear.  External ears unremarkable.  Moist mucous membranes. Lungs: Clear and comfortable work of breathing, speaking in full sentences without accessory muscle use. Cardiac: Regular rate and rhythm.  Neuro: CN II-XII grossly intact, gait normal. Extremities: No peripheral edema.  Strong peripheral pulses.  Mental Status: Mildly anxious, no depression or agitation. Logical thought process Skin: Warm and dry.    Assessment & Plan: [name] Brown was seen today for anxiety.  Diagnoses and all orders for this visit:  Anxiety state Orders: -     QUEtiapine (SEROQUEL XR) 50 MG TB24 24 hr tablet; Take 1 tablet (50 mg total) by mouth at bedtime. To prevent alcohol cravings and anxiety. Please follow up with psychiatry. -     chlordiazePOXIDE (LIBRIUM) 25 MG capsule; Take 1 capsule (25 mg total) by mouth 3 (three) times daily as needed for anxiety.  Alcohol abuse   Anxiety: Advised that complete alcohol cessation is the best approach to controlling his anxiety. Continue to see Alcoholics Anonymous, using Seroquel off label in hopes  of reducing cravings and possibly helping with anxiety. Switching from clonazepam to Librium. He was unaware that he's been scheduled for Behavioral Health next week, multiple times during our encounter and when I was seeing him off I asked him to go to behavioral health today before leaving the building to find out his date and time of an upcoming appointment and it I'm asking Behavioral Health to help with his alcohol abuse and anxiety.  Return if symptoms worsen or fail to improve.

## 2014-11-26 ENCOUNTER — Telehealth: Payer: Self-pay | Admitting: Family Medicine

## 2014-11-26 NOTE — Telephone Encounter (Signed)
Seth Bake, Will you please let patient know that [facility] has declined to cover his Seroquel.  Instead of offering an alternative today I'll leave this up to his psychiatrist that he is scheduled to meet with tomorrow at 8:30am unless he's changed this appointment.

## 2014-11-26 NOTE — Telephone Encounter (Signed)
Pt.notified

## 2014-11-27 ENCOUNTER — Telehealth: Payer: Self-pay | Admitting: Family Medicine

## 2014-11-27 ENCOUNTER — Ambulatory Visit (INDEPENDENT_AMBULATORY_CARE_PROVIDER_SITE_OTHER): Payer: 59 | Admitting: Physician Assistant

## 2014-11-27 ENCOUNTER — Encounter (HOSPITAL_COMMUNITY): Payer: Self-pay | Admitting: Physician Assistant

## 2014-11-27 VITALS — BP 138/82 | HR 96 | Ht 67.0 in | Wt 182.0 lb

## 2014-11-27 DIAGNOSIS — F1020 Alcohol dependence, uncomplicated: Secondary | ICD-10-CM | POA: Diagnosis not present

## 2014-11-27 MED ORDER — LORAZEPAM 0.5 MG PO TABS: ORAL_TABLET | ORAL | Status: DC
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
## 2014-11-27 NOTE — Telephone Encounter (Signed)
Notified by Milta Deiters in [facility] that patient appeared to be in alcohol withdrawal and recommended ativan rx to help him once he chooses to stop "cold [place]"

## 2015-01-06 ENCOUNTER — Ambulatory Visit (INDEPENDENT_AMBULATORY_CARE_PROVIDER_SITE_OTHER): Payer: 59 | Admitting: Family Medicine

## 2015-01-06 ENCOUNTER — Encounter: Payer: Self-pay | Admitting: Family Medicine

## 2015-01-06 VITALS — BP 137/90 | HR 84 | Ht 67.0 in | Wt 182.0 lb

## 2015-01-06 DIAGNOSIS — Z23 Encounter for immunization: Secondary | ICD-10-CM | POA: Diagnosis not present

## 2015-01-06 DIAGNOSIS — Z1211 Encounter for screening for malignant neoplasm of colon: Secondary | ICD-10-CM | POA: Diagnosis not present

## 2015-01-06 DIAGNOSIS — Z0000 Encounter for general adult medical examination without abnormal findings: Secondary | ICD-10-CM

## 2015-01-06 MED ORDER — BUSPIRONE HCL 10 MG PO TABS: 10.0000 mg | ORAL_TABLET | Freq: Two times a day (BID) | ORAL | Status: DC

## 2015-01-06 NOTE — Progress Notes (Signed)
CC: [name] Brown is a 49 y.o. male is here for Annual Exam   Subjective: HPI:  Colonoscopy: No family history of colon cancer has never had colonoscopy orders for this were placed today, strongly encouraged him to follow through with this. Prostate: Discussed screening risks/beneifts with patient today, he is open to twice a day if the PSA  \  Influenza Vaccine: No current indication Pneumovax: No current indication Td/Tdap: He is not sure when his last tetanus booster was, he will receive a booster today Zoster: No current indication  Requesting complete physical exam.  Continues to drink heavily on a almost daily basis. It does not sound like he took the lorazepam as prescribed and instead has been holding onto it and taking it on days he does not drink and starts to get withdrawal symptoms. He feels that he self-medicating for anxiety.  Review of Systems - General ROS: negative for - chills, fever, night sweats, weight gain or weight loss Ophthalmic ROS: negative for - decreased vision Psychological ROS: negative for - anxiety or depression ENT ROS: negative for - hearing change, nasal congestion, tinnitus or allergies Hematological and Lymphatic ROS: negative for - bleeding problems, bruising or swollen lymph nodes Breast ROS: negative Respiratory ROS: no cough, shortness of breath, or wheezing Cardiovascular ROS: no chest pain or dyspnea on exertion Gastrointestinal ROS: no abdominal pain, change in bowel habits, or black or bloody stools Genito-Urinary ROS: negative for - genital discharge, genital ulcers, incontinence or abnormal bleeding from genitals Musculoskeletal ROS: negative for - joint pain or muscle pain Neurological ROS: negative for - headaches or memory loss Dermatological ROS: negative for lumps, mole changes, rash and skin lesion changes  Past Medical History  Diagnosis Date  . Gout   . Depression   . Hyperlipidemia   . Anxiety   . Alcohol abuse      Past Surgical History  Procedure Laterality Date  . No past surgeries     Family History  Problem Relation Age of Onset  . Alcohol abuse Maternal Grandfather   . Hyperlipidemia Father   . Heart failure Father     History   Social History  . Marital Status: Single    Spouse Name: N/A  . Number of Children: N/A  . Years of Education: N/A   Occupational History  . Not on file.   Social History Main Topics  . Smoking status: Never Smoker   . Smokeless tobacco: Never Used  . Alcohol Use: 45.0 oz/week    60 Cans of beer, 15 Shots of liquor per week     Comment: approx 1/2 to 1/5th vodka daily and beers  . Drug Use: Yes    Special: Marijuana     Comment: couple of times a month  . Sexual Activity:    Partners: Female   Other Topics Concern  . Not on file   Social History Narrative     Objective: BP 137/90 mmHg  Pulse 84  Ht 5\' 7"  (1.702 m)  Wt 182 lb (82.555 kg)  BMI 28.50 kg/m2  General: No Acute Distress HEENT: Atraumatic, normocephalic, conjunctivae normal without scleral icterus.  No nasal discharge, hearing grossly intact, TMs with good landmarks bilaterally with no middle ear abnormalities, posterior pharynx clear without oral lesions. Neck: Supple, trachea midline, no cervical nor supraclavicular adenopathy. Pulmonary: Clear to auscultation bilaterally without wheezing, rhonchi, nor rales. Cardiac: Regular rate and rhythm.  No murmurs, rubs, nor gallops. No peripheral edema.  2+ peripheral pulses bilaterally.  Abdomen: Bowel sounds normal.  No masses.  Non-tender without rebound.  Negative Murphy's sign. GU: Bilateral testes which have descended without inguinal hernias.  MSK: Grossly intact, no signs of weakness.  Full strength throughout upper and lower extremities.  Full ROM in upper and lower extremities.  No midline spinal tenderness. Neuro: Gait unremarkable, CN II-XII grossly intact.  C5-C6 Reflex 2/4 Bilaterally, L4 Reflex 2/4 Bilaterally.  Cerebellar  function intact. Skin: No rashes. Psych: Alert and oriented to person/place/time.  Thought process normal. No anxiety/depression.  Assessment & Plan: [name] Brown was seen today for annual exam.  Diagnoses and all orders for this visit:  Annual physical exam Orders: -     PSA -     COMPLETE METABOLIC PANEL WITH GFR -     CBC -     Lipid panel -     busPIRone (BUSPAR) 10 MG tablet; Take 1 tablet (10 mg total) by mouth 2 (two) times daily.  Screening for colon cancer Orders: -     Ambulatory referral to Gastroenterology  Healthy lifestyle interventions including but not limited to regular exercise, a healthy low fat diet, moderation of salt intake, the dangers of tobacco/alcohol/recreational drug use, nutrition supplementation, and accident avoidance were discussed with the patient and a handout was provided for future reference.  Encouraged him to consider starting IOP which has been successful for him in the past with complete cessation. He has a variety of excuses but is not interested in comitting to this currently. I encouraged him to start BuSpar to help better control his anxiety. It doesn't sound like he is a good candidate for benzodiazepine's anymore since even though he did not take more than was prescribed he has not been taking lorazepam as prescribed and continues to drink heavily.  Return in about 4 weeks (around 02/03/2015) for Anxiety.

## 2015-01-07 LAB — CBC
HEMATOCRIT: 43.9 % (ref 39.0–52.0)
Hemoglobin: 15 g/dL (ref 13.0–17.0)
MCH: 32.9 pg (ref 26.0–34.0)
MCHC: 34.2 g/dL (ref 30.0–36.0)
MCV: 96.3 fL (ref 78.0–100.0)
MPV: 10.8 fL (ref 8.6–12.4)
Platelets: 105 10*3/uL — ABNORMAL LOW (ref 150–400)
RBC: 4.56 MIL/uL (ref 4.22–5.81)
RDW: 13.3 % (ref 11.5–15.5)
WBC: 4.1 10*3/uL (ref 4.0–10.5)

## 2015-01-07 LAB — LIPID PANEL
Cholesterol: 192 mg/dL (ref 0–200)
HDL: 103 mg/dL (ref >=40)
LDL Cholesterol: 77 mg/dL (ref 0–99)
TRIGLYCERIDES: 61 mg/dL (ref <150)
Total CHOL/HDL Ratio: 1.9 Ratio
VLDL: 12 mg/dL (ref 0–40)

## 2015-01-07 LAB — COMPLETE METABOLIC PANEL WITH GFR
ALT: 98 U/L — AB (ref 0–53)
AST: 153 U/L — AB (ref 0–37)
Albumin: 4.4 g/dL (ref 3.5–5.2)
Alkaline Phosphatase: 91 U/L (ref 39–117)
BUN: 9 mg/dL (ref 6–23)
CHLORIDE: 97 meq/L (ref 96–112)
CO2: 26 meq/L (ref 19–32)
CREATININE: 0.69 mg/dL (ref 0.50–1.35)
Calcium: 9.3 mg/dL (ref 8.4–10.5)
GLUCOSE: 93 mg/dL (ref 70–99)
POTASSIUM: 3.7 meq/L (ref 3.5–5.3)
Sodium: 137 mEq/L (ref 135–145)
Total Bilirubin: 1.3 mg/dL — ABNORMAL HIGH (ref 0.2–1.2)
Total Protein: 8.4 g/dL — ABNORMAL HIGH (ref 6.0–8.3)

## 2015-01-07 LAB — PSA: PSA: 1.98 ng/mL (ref <=4.00)

## 2015-02-03 ENCOUNTER — Ambulatory Visit (INDEPENDENT_AMBULATORY_CARE_PROVIDER_SITE_OTHER): Payer: 59 | Admitting: Family Medicine

## 2015-02-03 ENCOUNTER — Encounter: Payer: Self-pay | Admitting: Family Medicine

## 2015-02-03 VITALS — BP 142/96 | HR 74 | Wt 183.0 lb

## 2015-02-03 DIAGNOSIS — F411 Generalized anxiety disorder: Secondary | ICD-10-CM | POA: Insufficient documentation

## 2015-02-03 DIAGNOSIS — F1010 Alcohol abuse, uncomplicated: Secondary | ICD-10-CM | POA: Diagnosis not present

## 2015-02-03 MED ORDER — BUSPIRONE HCL 15 MG PO TABS: 15.0000 mg | ORAL_TABLET | Freq: Two times a day (BID) | ORAL | Status: DC

## 2015-02-03 NOTE — Progress Notes (Signed)
CC: [name] Brown is a 49 y.o. male is here for f/u anxiety   Subjective: HPI:  Follow-up anxiety: He feels like he is tolerating BuSpar at 10 mg twice a day. He denies any known side effects. He states that he feels like his somewhat better from an anxiety standpoint during the daytime but has difficulty going to sleep unless he drinks beer. Beer still seems to help calm his nerves. He does not quantify how much she is drinking but states he's no longer drinking alcohol in liquor form. He denies any depression or mental disturbance other than anxiety. He worries about things he has done in his past and job obligations in the future, it never seems if there is a reoccurring situation or element of paranoia to his anxiety.  He would like to go over his labs from his physical last month.   Review Of Systems Outlined In HPI  Past Medical History  Diagnosis Date  . Gout   . Depression   . Hyperlipidemia   . Anxiety   . Alcohol abuse     Past Surgical History  Procedure Laterality Date  . No past surgeries     Family History  Problem Relation Age of Onset  . Alcohol abuse Maternal Grandfather   . Hyperlipidemia Father   . Heart failure Father     Social History   Social History  . Marital Status: Single    Spouse Name: N/A  . Number of Children: N/A  . Years of Education: N/A   Occupational History  . Not on file.   Social History Main Topics  . Smoking status: Never Smoker   . Smokeless tobacco: Never Used  . Alcohol Use: 45.0 oz/week    60 Cans of beer, 15 Shots of liquor per week     Comment: approx 1/2 to 1/5th vodka daily and beers  . Drug Use: Yes    Special: Marijuana     Comment: couple of times a month  . Sexual Activity:    Partners: Female   Other Topics Concern  . Not on file   Social History Narrative     Objective: BP 142/96 mmHg  Pulse 74  Wt 183 lb (83.008 kg)  Vital signs reviewed. General: Alert and Oriented, No Acute Distress HEENT:  Pupils equal, round, reactive to light. Conjunctivae clear.  External ears unremarkable.  Moist mucous membranes. Lungs: Clear and comfortable work of breathing, speaking in full sentences without accessory muscle use. Cardiac: Regular rate and rhythm.  Neuro: CN II-XII grossly intact, gait normal. Extremities: No peripheral edema.  Strong peripheral pulses.  Mental Status: No depression, anxiety, nor agitation. Logical though process. Skin: Warm and dry.  Assessment & Plan: [name] was seen today for f/u anxiety.  Diagnoses and all orders for this visit:  Alcohol abuse  Anxiety state -     busPIRone (BUSPAR) 15 MG tablet; Take 1 tablet (15 mg total) by mouth 2 (two) times daily.   Anxiety: Chronic uncontrolled condition increasing BuSpar, reminded him that I think that complete alcohol cessation will be hard with respect anxiety for the first few days to weeks but overall would be the solution to his anxiety. Times taken to go over his labs and special attention was pointed out to his suppressed platelet count likely due to liver disease and signs of liver inflammation likely due to alcohol consumption.   Return for 1-2 months for anxiety.

## 2015-02-10 ENCOUNTER — Encounter: Payer: Self-pay | Admitting: Gastroenterology

## 2015-02-24 ENCOUNTER — Other Ambulatory Visit (HOSPITAL_COMMUNITY)
Admission: RE | Admit: 2015-02-24 | Discharge: 2015-02-24 | Disposition: A | Discharge status: Home / Self Care | Payer: 59 | Source: Ambulatory Visit | Attending: Family Medicine | Admitting: Family Medicine

## 2015-02-24 ENCOUNTER — Ambulatory Visit (INDEPENDENT_AMBULATORY_CARE_PROVIDER_SITE_OTHER): Payer: 59 | Admitting: Family Medicine

## 2015-02-24 ENCOUNTER — Encounter: Payer: Self-pay | Admitting: Family Medicine

## 2015-02-24 VITALS — BP 141/96 | HR 84 | Temp 98.0°F | Wt 183.0 lb

## 2015-02-24 DIAGNOSIS — R319 Hematuria, unspecified: Secondary | ICD-10-CM

## 2015-02-24 DIAGNOSIS — D696 Thrombocytopenia, unspecified: Secondary | ICD-10-CM

## 2015-02-24 NOTE — Progress Notes (Signed)
CC: [name] Brown is a 49 y.o. male is here for Hematuria   Subjective: HPI:  Saturday morning he woke up with blood in his urine on upon first voiding. There was a slight degree of pain when voiding described as a tingle in the penis. He also noticed there was some blood on his sheaths were he had slept the night before. Since then he's had no visual blood in his urine. He denies any recent trauma to the genitals. He tells me he did not have sex the night before the blood was noticed. Recent history significant for left flank pain and increased urination in the middle of last week. Symptoms have not been persistent. No interventions as of yet. He denies urinary hesitancy, urgency, abdominal pain, testicle pain, penile discharge other than blood. Denies bleeding issues elsewhere. Denies any pelvic pain.   Review Of Systems Outlined In HPI  Past Medical History  Diagnosis Date  . Gout   . Depression   . Hyperlipidemia   . Anxiety   . Alcohol abuse     Past Surgical History  Procedure Laterality Date  . No past surgeries     Family History  Problem Relation Age of Onset  . Alcohol abuse Maternal Grandfather   . Hyperlipidemia Father   . Heart failure Father     Social History   Social History  . Marital Status: Single    Spouse Name: N/A  . Number of Children: N/A  . Years of Education: N/A   Occupational History  . Not on file.   Social History Main Topics  . Smoking status: Never Smoker   . Smokeless tobacco: Never Used  . Alcohol Use: 45.0 oz/week    60 Cans of beer, 15 Shots of liquor per week     Comment: approx 1/2 to 1/5th vodka daily and beers  . Drug Use: Yes    Special: Marijuana     Comment: couple of times a month  . Sexual Activity:    Partners: Female   Other Topics Concern  . Not on file   Social History Narrative     Objective: BP 141/96 mmHg  Pulse 84  Temp(Src) 98 F (36.7 C) (Oral)  Wt 183 lb (83.008 kg)  SpO2 98%  Vital signs  reviewed. General: Alert and Oriented, No Acute Distress HEENT: Pupils equal, round, reactive to light. Conjunctivae clear.  External ears unremarkable.  Moist mucous membranes. Lungs: Clear and comfortable work of breathing, speaking in full sentences without accessory muscle use. Cardiac: Regular rate and rhythm.  Neuro: CN II-XII grossly intact, gait normal. Extremities: No peripheral edema.  Strong peripheral pulses.  Mental Status: No depression, anxiety, nor agitation. Logical though process. Skin: Warm and dry.  Assessment & Plan: [name] Brown was seen today for hematuria.  Diagnoses and all orders for this visit:  Thrombocytopenia -     CBC -     Urine culture -     GC/chlamydia probe amp, urine -     Urinalysis, Routine w reflex microscopic -     Cytology - non gyn  Hematuria -     Urine culture -     GC/chlamydia probe amp, urine -     Urinalysis, Routine w reflex microscopic -     Cytology - non gyn   Checking platelets given history of thrombocytopenia. Start evaluation of hematuria with urine culture, GC and Chlamydia, urinalysis and urine cytology. If blood is persistent and the rest of the labs  are unremarkable will send to urology.  Return if symptoms worsen or fail to improve.

## 2015-02-25 LAB — CBC
HEMATOCRIT: 46 % (ref 39.0–52.0)
Hemoglobin: 15.8 g/dL (ref 13.0–17.0)
MCH: 33.1 pg (ref 26.0–34.0)
MCHC: 34.3 g/dL (ref 30.0–36.0)
MCV: 96.2 fL (ref 78.0–100.0)
MPV: 10.7 fL (ref 8.6–12.4)
PLATELETS: 194 10*3/uL (ref 150–400)
RBC: 4.78 MIL/uL (ref 4.22–5.81)
RDW: 13.1 % (ref 11.5–15.5)
WBC: 5.1 10*3/uL (ref 4.0–10.5)

## 2015-02-25 LAB — URINALYSIS, ROUTINE W REFLEX MICROSCOPIC
BILIRUBIN URINE: NEGATIVE
GLUCOSE, UA: NEGATIVE
Hgb urine dipstick: NEGATIVE
Ketones, ur: NEGATIVE
Leukocytes, UA: NEGATIVE
Nitrite: NEGATIVE
PH: 5.5 (ref 5.0–8.0)
Protein, ur: NEGATIVE
SPECIFIC GRAVITY, URINE: 1.029 (ref 1.001–1.035)

## 2015-02-26 LAB — GC/CHLAMYDIA PROBE AMP, URINE
Chlamydia, Swab/Urine, PCR: NEGATIVE
GC Probe Amp, Urine: NEGATIVE

## 2015-02-26 LAB — URINE CULTURE
Colony Count: NO GROWTH
Organism ID, Bacteria: NO GROWTH

## 2015-03-26 ENCOUNTER — Ambulatory Visit (INDEPENDENT_AMBULATORY_CARE_PROVIDER_SITE_OTHER): Payer: 59 | Admitting: Family Medicine

## 2015-03-26 ENCOUNTER — Telehealth: Payer: Self-pay | Admitting: Family Medicine

## 2015-03-26 ENCOUNTER — Encounter: Payer: Self-pay | Admitting: Family Medicine

## 2015-03-26 VITALS — BP 140/90 | HR 78 | Temp 98.1°F | Wt 188.0 lb

## 2015-03-26 DIAGNOSIS — N419 Inflammatory disease of prostate, unspecified: Secondary | ICD-10-CM | POA: Insufficient documentation

## 2015-03-26 DIAGNOSIS — R300 Dysuria: Secondary | ICD-10-CM | POA: Diagnosis not present

## 2015-03-26 DIAGNOSIS — R319 Hematuria, unspecified: Secondary | ICD-10-CM | POA: Diagnosis not present

## 2015-03-26 DIAGNOSIS — N410 Acute prostatitis: Secondary | ICD-10-CM

## 2015-03-26 LAB — COMPREHENSIVE METABOLIC PANEL
ALBUMIN: 4.3 g/dL (ref 3.6–5.1)
ALT: 51 U/L — ABNORMAL HIGH (ref 9–46)
AST: 54 U/L — ABNORMAL HIGH (ref 10–35)
Alkaline Phosphatase: 68 U/L (ref 40–115)
BUN: 14 mg/dL (ref 7–25)
CHLORIDE: 99 mmol/L (ref 98–110)
CO2: 26 mmol/L (ref 20–31)
Calcium: 9.1 mg/dL (ref 8.6–10.3)
Creat: 0.54 mg/dL — ABNORMAL LOW (ref 0.70–1.33)
Glucose, Bld: 91 mg/dL (ref 65–99)
POTASSIUM: 4 mmol/L (ref 3.5–5.3)
Sodium: 134 mmol/L — ABNORMAL LOW (ref 135–146)
TOTAL PROTEIN: 8.1 g/dL (ref 6.1–8.1)
Total Bilirubin: 1 mg/dL (ref 0.2–1.2)

## 2015-03-26 LAB — POCT URINALYSIS DIPSTICK
BILIRUBIN UA: NEGATIVE
GLUCOSE UA: NEGATIVE
KETONES UA: NEGATIVE
LEUKOCYTES UA: NEGATIVE
Nitrite, UA: NEGATIVE
PH UA: 5.5
Protein, UA: NEGATIVE
Spec Grav, UA: 1.01
Urobilinogen, UA: 0.2

## 2015-03-26 LAB — CBC
HCT: 45.1 % (ref 39.0–52.0)
HEMOGLOBIN: 15.8 g/dL (ref 13.0–17.0)
MCH: 34 pg (ref 26.0–34.0)
MCHC: 35 g/dL (ref 30.0–36.0)
MCV: 97 fL (ref 78.0–100.0)
MPV: 10.6 fL (ref 8.6–12.4)
PLATELETS: 159 10*3/uL (ref 150–400)
RBC: 4.65 MIL/uL (ref 4.22–5.81)
RDW: 12.9 % (ref 11.5–15.5)
WBC: 4.2 10*3/uL (ref 4.0–10.5)

## 2015-03-26 MED ORDER — CIPROFLOXACIN HCL 500 MG PO TABS: 500.0000 mg | ORAL_TABLET | Freq: Two times a day (BID) | ORAL | Status: DC

## 2015-03-26 NOTE — Patient Instructions (Signed)
Thank you for coming in today. Please take Cipro twice daily for 2 weeks. Follow-up with [facility]. Return as needed. Get labs today.  Prostatitis The prostate gland is about the size and shape of a walnut. It is located just below your bladder. It produces one of the components of semen, which is made up of sperm and the fluids that help nourish and transport it out from the testicles. Prostatitis is inflammation of the prostate gland.  There are four types of prostatitis:  Acute bacterial prostatitis. This is the least common type of prostatitis. It starts quickly and usually is associated with a bladder infection, high fever, and shaking chills. It can occur at any age.  Chronic bacterial prostatitis. This is a persistent bacterial infection in the prostate. It usually develops from repeated acute bacterial prostatitis or acute bacterial prostatitis that was not properly treated. It can occur in men of any age but is most common in middle-aged men whose prostate has begun to enlarge. The symptoms are not as severe as those in acute bacterial prostatitis. Discomfort in the part of your body that is in front of your rectum and below your scrotum (perineum), lower abdomen, or in the head of your penis (glans) may represent your primary discomfort.  Chronic prostatitis (nonbacterial). This is the most common type of prostatitis. It is inflammation of the prostate gland that is not caused by a bacterial infection. The cause is unknown and may be associated with a viral infection or autoimmune disorder.  Prostatodynia (pelvic floor disorder). This is associated with increased muscular tone in the pelvis surrounding the prostate. CAUSES The causes of bacterial prostatitis are bacterial infection. The causes of the other types of prostatitis are unknown.  SYMPTOMS  Symptoms can vary depending upon the type of prostatitis that exists. There can also be overlap in symptoms. Possible symptoms  for each type of prostatitis are listed below. Acute Bacterial Prostatitis  Painful urination.  Fever or chills.  Muscle or joint pains.  Low back pain.  Low abdominal pain.  Inability to empty bladder completely. Chronic Bacterial Prostatitis, Chronic Nonbacterial Prostatitis, and Prostatodynia  Sudden urge to urinate.  Frequent urination.  Difficulty starting urine stream.  Weak urine stream.  Discharge from the urethra.  Dribbling after urination.  Rectal pain.  Pain in the testicles, penis, or tip of the penis.  Pain in the perineum.  Problems with sexual function.  Painful ejaculation.  Bloody semen. DIAGNOSIS  In order to diagnose prostatitis, your health care provider will ask about your symptoms. One or more urine samples will be taken and tested (urinalysis). If the urinalysis result is negative for bacteria, your health care provider may use a finger to feel your prostate (digital rectal exam). This exam helps your health care provider determine if your prostate is swollen and tender. It will also produce a specimen of semen that can be analyzed. TREATMENT  Treatment for prostatitis depends on the cause. If a bacterial infection is the cause, it can be treated with antibiotic medicine. In cases of chronic bacterial prostatitis, the use of antibiotics for up to 1 month or 6 weeks may be necessary. Your health care provider may instruct you to take sitz baths to help relieve pain. A sitz bath is a bath of hot water in which your hips and buttocks are under water. This relaxes the pelvic floor muscles and often helps to relieve the pressure on your prostate. HOME CARE INSTRUCTIONS   Take all medicines as  directed by your health care provider.  Take sitz baths as directed by your health care provider. SEEK MEDICAL CARE IF:   Your symptoms get worse, not better.  You have a fever. SEEK IMMEDIATE MEDICAL CARE IF:   You have chills.  You feel nauseous or  vomit.  You feel lightheaded or faint.  You are unable to urinate.  You have blood or blood clots in your urine. MAKE SURE YOU:  Understand these instructions.  Will watch your condition.  Will get help right away if you are not doing well or get worse.   This information is not intended to replace advice given to you by your health care provider. Make sure you discuss any questions you have with your health care provider.   Document Released: 06/03/2000 Document Revised: 06/27/2014 Document Reviewed: 12/24/2012 Elsevier Interactive Patient Education [employer].

## 2015-03-26 NOTE — Telephone Encounter (Signed)
Pt notified; order was placed earlier this am

## 2015-03-26 NOTE — Progress Notes (Signed)
[name] Brown is a 49 y.o. male who presents to [facility]  today for hematuria. He's has had visible hematuria in the past. He states that today his hematuria returned. Additionally he noted blood in the ejaculate and pain with urination and mild pelvic pain. He denies any fevers chills nausea vomiting or diarrhea.   Past Medical History  Diagnosis Date  . Gout   . Depression   . Hyperlipidemia   . Anxiety   . Alcohol abuse    Past Surgical History  Procedure Laterality Date  . No past surgeries     Social History  Substance Use Topics  . Smoking status: Never Smoker   . Smokeless tobacco: Never Used  . Alcohol Use: 45.0 oz/week    60 Cans of beer, 15 Shots of liquor per week     Comment: approx 1/2 to 1/5th vodka daily and beers   family history includes Alcohol abuse in his maternal grandfather; Heart failure in his father; Hyperlipidemia in his father.  ROS as above Medications: Current Outpatient Prescriptions  Medication Sig Dispense Refill  . allopurinol (ZYLOPRIM) 300 MG tablet Take 300 mg by mouth daily.    . busPIRone (BUSPAR) 15 MG tablet Take 1 tablet (15 mg total) by mouth 2 (two) times daily. 60 tablet 1  . indomethacin (INDOCIN) 50 MG capsule Take 1 capsule (50 mg total) by mouth 3 (three) times daily as needed (gout). 30 capsule 0  . Multiple Vitamin (MULTIVITAMIN WITH MINERALS) TABS tablet Take 1 tablet by mouth daily.    . Omega-3 Fatty Acids (FISH OIL PO) Take by mouth.    . ciprofloxacin (CIPRO) 500 MG tablet Take 1 tablet (500 mg total) by mouth 2 (two) times daily. 14 tablet 0  . [DISCONTINUED] traZODone (DESYREL) 50 MG tablet Take 1 tablet (50 mg total) by mouth at bedtime as needed and may repeat dose one time if needed for sleep. 60 tablet 2   No current facility-administered medications for this visit.   No Known Allergies   Exam:  BP 140/90 mmHg  Pulse 78  Temp(Src) 98.1 F (36.7 C) (Oral)  Wt 188 lb  (85.276 kg) Gen: Well NAD HEENT: EOMI,  MMM Lungs: Normal work of breathing. CTABL Heart: RRR no MRG Abd: NABS, Soft. Nondistended, Nontender Exts: Brisk capillary refill, warm and well perfused.  Rectal exam: Normal penis. Normal rectal tone. Prostate is medium size tender firm with a nodule at the left side.   Results for orders placed or performed in visit on 03/26/15 (from the past 24 hour(s))  POCT Urinalysis Dipstick     Status: None   Collection Time: 03/26/15 10:48 AM  Result Value Ref Range   Color, UA yellow    Clarity, UA clear    Glucose, UA neg    Bilirubin, UA neg    Ketones, UA neg    Spec Grav, UA 1.010    Blood, UA mod    pH, UA 5.5    Protein, UA neg    Urobilinogen, UA 0.2    Nitrite, UA neg    Leukocytes, UA Negative Negative   No results found.  49 year old man with visible hematuria and likely prostatitis. Additionally he has a prostate nodule. Plan for microscopic urinalysis, urine culture, CBC and CMP. Treatment with Cipro and referral to urology

## 2015-03-26 NOTE — Telephone Encounter (Signed)
Pt called. He has blood in urine and wants to be referred to a urologist. He said he and Dr Ileene Rubens has talked about this before at a previous visit.

## 2015-03-27 ENCOUNTER — Other Ambulatory Visit: Payer: Self-pay

## 2015-03-27 LAB — URINALYSIS, MICROSCOPIC ONLY
BACTERIA UA: NONE SEEN [HPF]
CASTS: NONE SEEN [LPF]
CRYSTALS: NONE SEEN [HPF]
SQUAMOUS EPITHELIAL / LPF: NONE SEEN [HPF] (ref <=5)
WBC, UA: NONE SEEN WBC/HPF (ref <=5)
Yeast: NONE SEEN [HPF]

## 2015-03-27 LAB — URINALYSIS, ROUTINE W REFLEX MICROSCOPIC
Bilirubin Urine: NEGATIVE
Glucose, UA: NEGATIVE
KETONES UR: NEGATIVE
Leukocytes, UA: NEGATIVE
NITRITE: NEGATIVE
PH: 5.5 (ref 5.0–8.0)
Protein, ur: NEGATIVE
SPECIFIC GRAVITY, URINE: 1.009 (ref 1.001–1.035)

## 2015-03-27 MED ORDER — CIPROFLOXACIN HCL 500 MG PO TABS: 500.0000 mg | ORAL_TABLET | Freq: Two times a day (BID) | ORAL | Status: DC

## 2015-03-27 NOTE — Telephone Encounter (Signed)
Called to notify [name] Brown of his recent lab results. Pt stated that the Rx of Cipro was written for 1 tablet BID for 7 days instead of 1 tablet BID for 14 days. Dr. Darene Lamer gave the ok to send in the other 14 tablets.

## 2015-03-27 NOTE — Progress Notes (Signed)
Quick Note:  Labs are pretty normal. Follow up with Urology ______

## 2015-03-28 LAB — URINE CULTURE
COLONY COUNT: NO GROWTH
Organism ID, Bacteria: NO GROWTH

## 2015-03-31 NOTE — Progress Notes (Signed)
Quick Note:  Urine culture shows no growth. Follow-up with urology as previously discussed. ______

## 2015-04-02 ENCOUNTER — Ambulatory Visit (AMBULATORY_SURGERY_CENTER): Payer: Self-pay | Admitting: *Deleted

## 2015-04-02 VITALS — Ht 67.0 in | Wt 189.8 lb

## 2015-04-02 DIAGNOSIS — Z1211 Encounter for screening for malignant neoplasm of colon: Secondary | ICD-10-CM

## 2015-04-02 MED ORDER — MOVIPREP 100 G PO SOLR: 1.0000 | Freq: Once | ORAL | Status: DC

## 2015-04-02 NOTE — Progress Notes (Signed)
No egg or soy allergy No issues with past sedation but with jaw nerve cautery once was hard to wake post op  No diet pills No home 02 emmi video to e mail Discussed the clear liquid list with this patient multi times as he kept asking things like was a banana considered clear liquids. Instructed pt nothing to eat at all. He can only have what's on the list  given today

## 2015-04-06 ENCOUNTER — Ambulatory Visit (INDEPENDENT_AMBULATORY_CARE_PROVIDER_SITE_OTHER): Payer: 59 | Admitting: Family Medicine

## 2015-04-06 ENCOUNTER — Encounter: Payer: Self-pay | Admitting: Family Medicine

## 2015-04-06 ENCOUNTER — Other Ambulatory Visit: Payer: Self-pay | Admitting: *Deleted

## 2015-04-06 VITALS — BP 146/91 | HR 78 | Wt 184.0 lb

## 2015-04-06 DIAGNOSIS — Z23 Encounter for immunization: Secondary | ICD-10-CM

## 2015-04-06 DIAGNOSIS — F329 Major depressive disorder, single episode, unspecified: Secondary | ICD-10-CM | POA: Diagnosis not present

## 2015-04-06 DIAGNOSIS — F411 Generalized anxiety disorder: Secondary | ICD-10-CM

## 2015-04-06 DIAGNOSIS — F32A Depression, unspecified: Secondary | ICD-10-CM

## 2015-04-06 MED ORDER — BUSPIRONE HCL 15 MG PO TABS: 15.0000 mg | ORAL_TABLET | Freq: Two times a day (BID) | ORAL | Status: DC

## 2015-04-06 MED ORDER — DOXEPIN HCL 75 MG PO CAPS: 75.0000 mg | ORAL_CAPSULE | Freq: Every day | ORAL | Status: DC

## 2015-04-06 NOTE — Progress Notes (Signed)
CC: [name] Brown is a 49 y.o. male is here for Anxiety   Subjective: HPI:  Follow-up anxiety: Since increasing BuSpar he feels like it's helped a little bit. He does not feel on edge at work anymore. He still self-medicating with drinking beer if he gets anxious and he'll be at home for the rest of the evening.  Follow-up depression: Since his last he's been experiencing random episodes of subjective depression described as lack of motivation to leave the house and enjoying formerly pleasurable activities. His girlfriend mentions that he seems less interested in having fun. He reports nightly dreams that wake him up in the middle of the night leaving with a sense of sadness. He tells me that these dreams are not threatening or nightmares. Denies unintentional weight loss, fevers, chills, or chest pain   Review Of Systems Outlined In HPI  Past Medical History  Diagnosis Date  . Gout   . Depression   . Hyperlipidemia   . Anxiety   . Alcohol abuse   . Allergy     seasonal but not treated     Past Surgical History  Procedure Laterality Date  . Knee surgery  1989  . Nerve cautery      jaw bone    Family History  Problem Relation Age of Onset  . Alcohol abuse Maternal Grandfather   . Hyperlipidemia Father   . Heart failure Father   . Colon cancer Neg Hx   . Stomach cancer Neg Hx   . Rectal cancer Neg Hx   . Colon polyps Neg Hx     Social History   Social History  . Marital Status: Single    Spouse Name: N/A  . Number of Children: N/A  . Years of Education: N/A   Occupational History  . Not on file.   Social History Main Topics  . Smoking status: Never Smoker   . Smokeless tobacco: Never Used  . Alcohol Use: 45.0 oz/week    60 Cans of beer, 15 Shots of liquor per week     Comment: approx 1/2 to 1/5th vodka daily and beers  . Drug Use: Yes    Special: Marijuana     Comment: couple of times a month  . Sexual Activity:    Partners: Female   Other Topics Concern   . Not on file   Social History Narrative     Objective: BP 146/91 mmHg  Pulse 78  Wt 184 lb (83.462 kg) Vital signs reviewed. General: Alert and Oriented, No Acute Distress HEENT: Pupils equal, round, reactive to light. Conjunctivae clear.  External ears unremarkable.  Moist mucous membranes. Lungs: Clear and comfortable work of breathing, speaking in full sentences without accessory muscle use. Cardiac: Regular rate and rhythm.  Neuro: CN II-XII grossly intact, gait normal. Extremities: No peripheral edema.  Strong peripheral pulses.  Mental Status: No depression, anxiety, nor agitation. Logical though process. Skin: Warm and dry. Assessment & Plan: [name] Brown was seen today for anxiety.  Diagnoses and all orders for this visit:  Anxiety state  Depression  Other orders -     doxepin (SINEQUAN) 75 MG capsule; Take 1 capsule (75 mg total) by mouth at bedtime.   Anxiety: Controlled continue BuSpar depression: Uncontrolled chronic condition,Adding doxepin which I'm hoping will help with depression and with vivid dreams interfering with his sleep.   Return in about 4 weeks (around 05/04/2015) for Anxiety and Depression.

## 2015-04-17 ENCOUNTER — Ambulatory Visit (AMBULATORY_SURGERY_CENTER): Payer: 59 | Admitting: Gastroenterology

## 2015-04-17 ENCOUNTER — Encounter: Payer: Self-pay | Admitting: Gastroenterology

## 2015-04-17 VITALS — BP 142/85 | HR 61 | Temp 97.9°F | Resp 18 | Ht 67.0 in | Wt 189.0 lb

## 2015-04-17 DIAGNOSIS — Z1211 Encounter for screening for malignant neoplasm of colon: Secondary | ICD-10-CM | POA: Diagnosis present

## 2015-04-17 DIAGNOSIS — D125 Benign neoplasm of sigmoid colon: Secondary | ICD-10-CM

## 2015-04-17 MED ORDER — SODIUM CHLORIDE 0.9 % IV SOLN: 500.0000 mL | INTRAVENOUS | Status: DC

## 2015-04-17 NOTE — Op Note (Signed)
[place]  [address] [place], [address]   COLONOSCOPY PROCEDURE REPORT  PATIENT: [name], [name]  MR#: [id number] BIRTHDATE: July 01, 1965 , 49  yrs. old GENDER: male ENDOSCOPIST: Milus Banister, MD REFERRED BY: Marcial Pacas, MD PROCEDURE DATE:  04/17/2015 PROCEDURE:   Colonoscopy, screening and Colonoscopy with snare polypectomy First Screening Colonoscopy - Avg.  risk and is 50 yrs.  old or older Yes.  Prior Negative Screening - Now for repeat screening. N/A  History of Adenoma - Now for follow-up colonoscopy & has been > or = to 3 yrs.  N/A  Polyps removed today? Yes ASA CLASS:   Class II INDICATIONS:Screening for colonic neoplasia and Colorectal Neoplasm Risk Assessment for this procedure is average risk. MEDICATIONS: Monitored anesthesia care and Propofol 300 mg IV  DESCRIPTION OF PROCEDURE:   After the risks benefits and alternatives of the procedure were thoroughly explained, informed consent was obtained.  The digital rectal exam revealed no abnormalities of the rectum.   The [id number]  endoscope was introduced through the anus and advanced to the cecum, which was identified by both the appendix and ileocecal valve. No adverse events experienced.   The quality of the prep was excellent.  The instrument was then slowly withdrawn as the colon was fully examined. Estimated blood loss is zero unless otherwise noted in this procedure report.   COLON FINDINGS: A sessile polyp measuring 3 mm in size was found in the sigmoid colon.  A polypectomy was performed with a cold snare. The resection was complete, the polyp tissue was completely retrieved and sent to histology.   The examination was otherwise normal.  Retroflexed views revealed no abnormalities. The time to cecum = 3.2 Withdrawal time = 7.9   The scope was withdrawn and the procedure completed. COMPLICATIONS: There were no immediate complications.  ENDOSCOPIC IMPRESSION: 1.   Sessile  polyp was found in the sigmoid colon; polypectomy was performed with a cold snare 2.   The examination was otherwise normal  RECOMMENDATIONS: If the polyp(s) removed today are proven to be adenomatous (pre-cancerous) polyps, you will need a repeat colonoscopy in 5 years.  Otherwise you should continue to follow colorectal cancer screening guidelines for "routine risk" patients with colonoscopy in 10 years.  You will receive a letter within 1-2 weeks with the results of your biopsy as well as final recommendations.  Please call my office if you have not received a letter after 3 weeks.  eSigned:  Milus Banister, MD 04/17/2015 9:51 AM

## 2015-04-17 NOTE — Patient Instructions (Addendum)

## 2015-04-17 NOTE — Progress Notes (Signed)
Report to PACU, RN, vss, BBS= Clear.  

## 2015-04-17 NOTE — Progress Notes (Signed)
Called to room to assist during endoscopic procedure.  Patient ID and intended procedure confirmed with present staff. Received instructions for my participation in the procedure from the performing physician.  

## 2015-04-20 ENCOUNTER — Telehealth: Payer: Self-pay | Admitting: *Deleted

## 2015-04-20 NOTE — Telephone Encounter (Signed)
  Follow up Call-  Call back number 04/17/2015  Post procedure Call Back phone  # [phone]  Permission to leave phone message Yes     Patient questions:  Do you have a fever, pain , or abdominal swelling? No. Pain Score  0 *  Have you tolerated food without any problems? Yes.    Have you been able to return to your normal activities? Yes.    Do you have any questions about your discharge instructions: Diet   No. Medications  No. Follow up visit  No.  Do you have questions or concerns about your Care? No.  Actions: * If pain score is 4 or above: No action needed, pain <4.

## 2015-04-24 ENCOUNTER — Encounter: Payer: Self-pay | Admitting: Gastroenterology

## 2015-05-05 ENCOUNTER — Encounter: Payer: Self-pay | Admitting: Family Medicine

## 2015-05-05 DIAGNOSIS — Z86010 Personal history of colonic polyps: Secondary | ICD-10-CM | POA: Insufficient documentation

## 2015-05-25 ENCOUNTER — Encounter: Payer: Self-pay | Admitting: Family Medicine

## 2015-05-25 ENCOUNTER — Ambulatory Visit (INDEPENDENT_AMBULATORY_CARE_PROVIDER_SITE_OTHER): Payer: 59 | Admitting: Family Medicine

## 2015-05-25 ENCOUNTER — Telehealth: Payer: Self-pay | Admitting: Family Medicine

## 2015-05-25 VITALS — BP 158/101 | HR 90 | Wt 193.0 lb

## 2015-05-25 DIAGNOSIS — E278 Other specified disorders of adrenal gland: Secondary | ICD-10-CM | POA: Insufficient documentation

## 2015-05-25 DIAGNOSIS — D497 Neoplasm of unspecified behavior of endocrine glands and other parts of nervous system: Secondary | ICD-10-CM

## 2015-05-25 DIAGNOSIS — F329 Major depressive disorder, single episode, unspecified: Secondary | ICD-10-CM

## 2015-05-25 DIAGNOSIS — F411 Generalized anxiety disorder: Secondary | ICD-10-CM | POA: Diagnosis not present

## 2015-05-25 DIAGNOSIS — F32A Depression, unspecified: Secondary | ICD-10-CM

## 2015-05-25 DIAGNOSIS — I10 Essential (primary) hypertension: Secondary | ICD-10-CM | POA: Diagnosis not present

## 2015-05-25 DIAGNOSIS — D3500 Benign neoplasm of unspecified adrenal gland: Secondary | ICD-10-CM

## 2015-05-25 MED ORDER — VILAZODONE HCL 10 & 20 MG PO KIT
1.0000 | PACK | Freq: Every day | ORAL | Status: DC
Start: 2015-05-25 — End: 2015-07-02

## 2015-05-25 NOTE — Telephone Encounter (Signed)
Will you please call [facility] and ask for the most recent CT scan report for this patient?

## 2015-05-25 NOTE — Progress Notes (Signed)
CC: [name] Brown is a 49 y.o. male is here for Depression; Anxiety; and Hypertension   Subjective: HPI:  Follow-up essential hypertension: He tells me blood pressures outside of our office have been normotensive. He believes his blood pressure is up today because he just got news that he has a 8 cm diameter adrenal tumor that was found during hematuria workup with his urologist. No chest pain shortness of breath orthopnea or peripheral edema   he tells me his anxiety is pre-well controlled right now. He tells me that he feels much better from an anxiety standpoint and he did 4 months ago. His biggest complaint is that he is losing interest in hobbies and has been losing interest in life however is not wanting to harm self or others. Tells me he just doesn't care about making decisions anymore. Symptoms are not interfering with his job. No unintentional weight loss or gain, hallucinations or paranoia.   Review Of Systems Outlined In HPI  Past Medical History  Diagnosis Date  . Gout   . Depression   . Hyperlipidemia   . Anxiety   . Alcohol abuse   . Allergy     seasonal but not treated     Past Surgical History  Procedure Laterality Date  . Knee surgery  1989  . Nerve cautery      jaw bone    Family History  Problem Relation Age of Onset  . Alcohol abuse Maternal Grandfather   . Hyperlipidemia Father   . Heart failure Father   . Colon cancer Neg Hx   . Stomach cancer Neg Hx   . Rectal cancer Neg Hx   . Colon polyps Neg Hx     Social History   Social History  . Marital Status: Single    Spouse Name: N/A  . Number of Children: N/A  . Years of Education: N/A   Occupational History  . Not on file.   Social History Main Topics  . Smoking status: Never Smoker   . Smokeless tobacco: Never Used  . Alcohol Use: 45.0 oz/week    60 Cans of beer, 15 Shots of liquor per week     Comment: approx 1/2 to 1/5th vodka daily and beers  . Drug Use: Yes    Special: Marijuana   Comment: couple of times a month  . Sexual Activity:    Partners: Female   Other Topics Concern  . Not on file   Social History Narrative     Objective: BP 158/101 mmHg  Pulse 90  Wt 193 lb (87.544 kg) Vital signs reviewed. General: Alert and Oriented, No Acute Distress HEENT: Pupils equal, round, reactive to light. Conjunctivae clear.  External ears unremarkable.  Moist mucous membranes. Lungs: Clear and comfortable work of breathing, speaking in full sentences without accessory muscle use. Cardiac: Regular rate and rhythm.  Neuro: CN II-XII grossly intact, gait normal. Extremities: No peripheral edema.  Strong peripheral pulses.  Mental Status: No depression, anxiety, nor agitation. Logical though process. Skin: Warm and dry.  Assessment & Plan: Olivia was seen today for depression, anxiety and hypertension.  Diagnoses and all orders for this visit:  Essential hypertension  Anxiety state  Adrenal tumor  Depression -     Vilazodone HCl (VIIBRYD STARTER PACK) 10 & 20 MG KIT; Take 1 tablet by mouth daily.   Essential hypertension,Uncontrolled I would like for him to start on a beta blocker however he is adamant that his blood pressures elevated today because  of the bad news he got about his tumor. Requesting outside records for clarification on this. Anxiety: Controlled continue BuSpar Depression: Uncontrolled chronic condition adding Viibryd   Return in about 4 weeks (around 06/22/2015).

## 2015-05-26 NOTE — Telephone Encounter (Signed)
Results revcieved

## 2015-05-28 ENCOUNTER — Telehealth: Payer: Self-pay | Admitting: Family Medicine

## 2015-05-28 DIAGNOSIS — D497 Neoplasm of unspecified behavior of endocrine glands and other parts of nervous system: Secondary | ICD-10-CM

## 2015-05-28 NOTE — Telephone Encounter (Signed)
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 I read up about this and over 95% of these are benign and the remaining 5% will produce hormones.  The only real threat of this is that it will probably keep growing and will one day cause pain.

## 2015-05-29 NOTE — Telephone Encounter (Signed)
Pt advised.

## 2015-06-05 DIAGNOSIS — Z23 Encounter for immunization: Secondary | ICD-10-CM

## 2015-07-02 ENCOUNTER — Encounter: Payer: Self-pay | Admitting: Family Medicine

## 2015-07-02 ENCOUNTER — Ambulatory Visit (INDEPENDENT_AMBULATORY_CARE_PROVIDER_SITE_OTHER): Payer: 59 | Admitting: Family Medicine

## 2015-07-02 VITALS — BP 140/90 | HR 80 | Wt 189.0 lb

## 2015-07-02 DIAGNOSIS — G4700 Insomnia, unspecified: Secondary | ICD-10-CM | POA: Diagnosis not present

## 2015-07-02 DIAGNOSIS — F411 Generalized anxiety disorder: Secondary | ICD-10-CM | POA: Diagnosis not present

## 2015-07-02 DIAGNOSIS — I10 Essential (primary) hypertension: Secondary | ICD-10-CM | POA: Diagnosis not present

## 2015-07-02 MED ORDER — BUSPIRONE HCL 15 MG PO TABS: 15.0000 mg | ORAL_TABLET | Freq: Two times a day (BID) | ORAL | Status: DC

## 2015-07-02 MED ORDER — ZOLPIDEM TARTRATE 5 MG PO TABS: 2.5000 mg | ORAL_TABLET | Freq: Every evening | ORAL | Status: DC | PRN

## 2015-07-02 NOTE — Progress Notes (Signed)
CC: [name] Brown is a 50 y.o. male is here for Hypertension   Subjective: HPI:  Follow-up essential hypertension: He tells me he is less anxious thinking about his tumor next to his adrenal gland. He believes this has helped his blood pressure. No outside blood pressures to report. No chest pain shortness of breath orthopnea nor peripheral edema. He tries to watch his sodium intake.  Follow-up insomnia: Doxepin was too strong, the left and fatigue in the morning. He still has difficulty staying asleep most nights of the week. He is not sure what is waking him up. He denies urinary habits, pain or anxiety waking him up.  Follow-up anxiety: He is about to run out of BuSpar. He thinks it's helping overall reduces daily anxiety. He denies any known side effects. Denies depression or any other mental disturbance   Review Of Systems Outlined In HPI  Past Medical History  Diagnosis Date  . Gout   . Depression   . Hyperlipidemia   . Anxiety   . Alcohol abuse   . Allergy     seasonal but not treated     Past Surgical History  Procedure Laterality Date  . Knee surgery  1989  . Nerve cautery      jaw bone    Family History  Problem Relation Age of Onset  . Alcohol abuse Maternal Grandfather   . Hyperlipidemia Father   . Heart failure Father   . Colon cancer Neg Hx   . Stomach cancer Neg Hx   . Rectal cancer Neg Hx   . Colon polyps Neg Hx     Social History   Social History  . Marital Status: Single    Spouse Name: N/A  . Number of Children: N/A  . Years of Education: N/A   Occupational History  . Not on file.   Social History Main Topics  . Smoking status: Never Smoker   . Smokeless tobacco: Never Used  . Alcohol Use: 45.0 oz/week    60 Cans of beer, 15 Shots of liquor per week     Comment: approx 1/2 to 1/5th vodka daily and beers  . Drug Use: Yes    Special: Marijuana     Comment: couple of times a month  . Sexual Activity:    Partners: Female   Other Topics  Concern  . Not on file   Social History Narrative     Objective: BP 140/90 mmHg  Pulse 80  Wt 189 lb (85.73 kg)  Vital signs reviewed. General: Alert and Oriented, No Acute Distress HEENT: Pupils equal, round, reactive to light. Conjunctivae clear.  External ears unremarkable.  Moist mucous membranes. Lungs: Clear and comfortable work of breathing, speaking in full sentences without accessory muscle use. Cardiac: Regular rate and rhythm.  Neuro: CN II-XII grossly intact, gait normal. Extremities: No peripheral edema.  Strong peripheral pulses.  Mental Status: No depression, anxiety, nor agitation. Logical though process. Skin: Warm and dry.  Assessment & Plan: [name] was seen today for hypertension.  Diagnoses and all orders for this visit:  Essential hypertension  Insomnia -     zolpidem (AMBIEN) 5 MG tablet; Take 0.5-1 tablets (2.5-5 mg total) by mouth at bedtime as needed for sleep.  Anxiety state -     busPIRone (BUSPAR) 15 MG tablet; Take 1 tablet (15 mg total) by mouth 2 (two) times daily.   Essential hypertension: uncontrolled,Encouraged to start on a beta blocker however he politely declines Insomnia: Uncontrolled chronic condition  adding low-dose of Ambien. Anxiety: Controlled with BuSpar. Refills provided  Return in about 3 months (around 09/30/2015) for bp.

## 2015-07-14 ENCOUNTER — Other Ambulatory Visit: Payer: Self-pay | Admitting: Urology

## 2015-07-28 ENCOUNTER — Encounter (HOSPITAL_COMMUNITY)
Admission: RE | Admit: 2015-07-28 | Discharge: 2015-07-28 | Disposition: A | Discharge status: Home / Self Care | Payer: 59 | Source: Ambulatory Visit | Attending: Urology | Admitting: Urology

## 2015-07-28 ENCOUNTER — Encounter (HOSPITAL_COMMUNITY): Payer: Self-pay

## 2015-07-28 DIAGNOSIS — E279 Disorder of adrenal gland, unspecified: Secondary | ICD-10-CM | POA: Insufficient documentation

## 2015-07-28 DIAGNOSIS — Z01812 Encounter for preprocedural laboratory examination: Secondary | ICD-10-CM | POA: Insufficient documentation

## 2015-07-28 DIAGNOSIS — Z01818 Encounter for other preprocedural examination: Secondary | ICD-10-CM | POA: Insufficient documentation

## 2015-07-28 DIAGNOSIS — Z0183 Encounter for blood typing: Secondary | ICD-10-CM | POA: Diagnosis not present

## 2015-07-28 HISTORY — DX: Other specified disorders of adrenal gland: E27.8

## 2015-07-28 HISTORY — DX: Nocturia: R35.1

## 2015-07-28 HISTORY — DX: Personal history of other diseases of urinary system: Z87.448

## 2015-07-28 HISTORY — DX: Benign prostatic hyperplasia without lower urinary tract symptoms: N40.0

## 2015-07-28 LAB — COMPREHENSIVE METABOLIC PANEL
ALBUMIN: 4.6 g/dL (ref 3.5–5.0)
ALT: 46 U/L (ref 17–63)
ANION GAP: 11 (ref 5–15)
AST: 56 U/L — ABNORMAL HIGH (ref 15–41)
Alkaline Phosphatase: 59 U/L (ref 38–126)
BUN: 10 mg/dL (ref 6–20)
CHLORIDE: 103 mmol/L (ref 101–111)
CO2: 26 mmol/L (ref 22–32)
Calcium: 9.3 mg/dL (ref 8.9–10.3)
Creatinine, Ser: 0.74 mg/dL (ref 0.61–1.24)
GFR calc non Af Amer: >60 mL/min (ref >60)
Glucose, Bld: 107 mg/dL — ABNORMAL HIGH (ref 65–99)
Potassium: 3.9 mmol/L (ref 3.5–5.1)
SODIUM: 140 mmol/L (ref 135–145)
Total Bilirubin: 0.9 mg/dL (ref 0.3–1.2)
Total Protein: 8.3 g/dL — ABNORMAL HIGH (ref 6.5–8.1)

## 2015-07-28 LAB — CBC
HEMATOCRIT: 45.6 % (ref 39.0–52.0)
HEMOGLOBIN: 15.2 g/dL (ref 13.0–17.0)
MCH: 33.7 pg (ref 26.0–34.0)
MCHC: 33.3 g/dL (ref 30.0–36.0)
MCV: 101.1 fL — AB (ref 78.0–100.0)
Platelets: 161 10*3/uL (ref 150–400)
RBC: 4.51 MIL/uL (ref 4.22–5.81)
RDW: 13.5 % (ref 11.5–15.5)
WBC: 3.8 10*3/uL — ABNORMAL LOW (ref 4.0–10.5)

## 2015-07-28 LAB — ABO/RH: ABO/RH(D): A POS

## 2015-07-28 NOTE — Patient Instructions (Addendum)
[name] Brown  07/28/2015   Your procedure is scheduled on: Wednesday August 05, 2015  Report to [facility]  Entrance take [place]  elevators to 3rd floor to  [place] at 11:45 AM.  Call this number if you have problems the morning of surgery [phone]   Remember: ONLY 1 PERSON MAY GO WITH YOU TO SHORT STAY TO GET  READY MORNING OF [place].  CLEAR LIQUID DIET BEGINNING 06/02/2016 PER SURGEON'S INSTRUCTION CAN CONTINUE TILL 7:45 am DAY OF SURGERY THEN NOTHING BY MOUTH.     Take these medicines the morning of surgery with A SIP OF WATER: Allopurinol; Buspirone (Buspar)                               You may not have any metal on your body including hair pins and              piercings  Do not wear jewelry, lotions, powders or colognes, deodorant                        Men may shave face and neck.   Do not bring valuables to the hospital. [place].  Contacts, dentures or bridgework may not be worn into surgery.  Leave suitcase in the car. After surgery it may be brought to your room.               FOLLOW SURGEON'S INSTRUCTION IN REGARDS TO BOWEL PREPARATION DAY PRIOR TO SURGERY  _____________________________________________________________________             [facility] - Preparing for Surgery Before surgery, you can play an important role.  Because skin is not sterile, your skin needs to be as free of germs as possible.  You can reduce the number of germs on your skin by washing with CHG (chlorahexidine gluconate) soap before surgery.  CHG is an antiseptic cleaner which kills germs and bonds with the skin to continue killing germs even after washing. Please DO NOT use if you have an allergy to CHG or antibacterial soaps.  If your skin becomes reddened/irritated stop using the CHG and inform your nurse when you arrive at Short Stay. Do not shave (including legs and underarms) for at least 48  hours prior to the first CHG shower.  You may shave your face/neck. Please follow these instructions carefully:  1.  Shower with CHG Soap the night before surgery and the  morning of Surgery.  2.  If you choose to wash your hair, wash your hair first as usual with your  normal  shampoo.  3.  After you shampoo, rinse your hair and body thoroughly to remove the  shampoo.                           4.  Use CHG as you would any other liquid soap.  You can apply chg directly  to the skin and wash                       Gently with a scrungie or clean washcloth.  5.  Apply the CHG Soap to your body  ONLY FROM THE NECK DOWN.   Do not use on face/ open                           Wound or open sores. Avoid contact with eyes, ears mouth and genitals (private parts).                       Wash face,  Genitals (private parts) with your normal soap.             6.  Wash thoroughly, paying special attention to the area where your surgery  will be performed.  7.  Thoroughly rinse your body with warm water from the neck down.  8.  DO NOT shower/wash with your normal soap after using and rinsing off  the CHG Soap.                9.  Pat yourself dry with a clean towel.            10.  Wear clean pajamas.            11.  Place clean sheets on your bed the night of your first shower and do not  sleep with pets. Day of Surgery : Do not apply any lotions/deodorants the morning of surgery.  Please wear clean clothes to the hospital/surgery center.  FAILURE TO FOLLOW THESE INSTRUCTIONS MAY RESULT IN THE CANCELLATION OF YOUR SURGERY PATIENT SIGNATURE_________________________________  NURSE SIGNATURE__________________________________  ________________________________________________________________________    CLEAR LIQUID DIET   Foods Allowed                                                                     Foods Excluded  Coffee and tea, regular and decaf                             liquids that you cannot   Plain Jell-O in any flavor                                             see through such as: Fruit ices (not with fruit pulp)                                     milk, soups, orange juice  Iced Popsicles                                    All solid food Carbonated beverages, regular and diet                                    Cranberry, grape and apple juices Sports drinks like Gatorade Lightly seasoned clear broth or consume(fat free) Sugar, honey syrup  Sample Menu Breakfast  Lunch                                     Supper Cranberry juice                    Beef broth                            Chicken broth Jell-O                                     Grape juice                           Apple juice Coffee or tea                        Jell-O                                      Popsicle                                                Coffee or tea                        Coffee or tea  _____________________________________________________________________

## 2015-07-29 ENCOUNTER — Encounter (HOSPITAL_COMMUNITY): Payer: Self-pay

## 2015-08-05 ENCOUNTER — Inpatient Hospital Stay (HOSPITAL_COMMUNITY): Payer: 59 | Admitting: Anesthesiology

## 2015-08-05 ENCOUNTER — Inpatient Hospital Stay (HOSPITAL_COMMUNITY)
Admission: RE | Admit: 2015-08-05 | Discharge: 2015-08-06 | DRG: 581 | Disposition: A | Discharge status: Home / Self Care | Payer: 59 | Source: Ambulatory Visit | Attending: Urology | Admitting: Urology

## 2015-08-05 ENCOUNTER — Encounter (HOSPITAL_COMMUNITY)
Admission: RE | Disposition: A | Discharge status: Home / Self Care | Payer: Self-pay | Source: Ambulatory Visit | Attending: Urology

## 2015-08-05 ENCOUNTER — Encounter (HOSPITAL_COMMUNITY): Payer: Self-pay | Admitting: *Deleted

## 2015-08-05 DIAGNOSIS — Z01812 Encounter for preprocedural laboratory examination: Secondary | ICD-10-CM | POA: Diagnosis not present

## 2015-08-05 DIAGNOSIS — E278 Other specified disorders of adrenal gland: Secondary | ICD-10-CM | POA: Diagnosis present

## 2015-08-05 DIAGNOSIS — N400 Enlarged prostate without lower urinary tract symptoms: Secondary | ICD-10-CM | POA: Diagnosis present

## 2015-08-05 DIAGNOSIS — Z8249 Family history of ischemic heart disease and other diseases of the circulatory system: Secondary | ICD-10-CM | POA: Diagnosis not present

## 2015-08-05 DIAGNOSIS — R310 Gross hematuria: Secondary | ICD-10-CM | POA: Diagnosis present

## 2015-08-05 DIAGNOSIS — D1779 Benign lipomatous neoplasm of other sites: Principal | ICD-10-CM | POA: Diagnosis present

## 2015-08-05 DIAGNOSIS — E279 Disorder of adrenal gland, unspecified: Secondary | ICD-10-CM | POA: Diagnosis present

## 2015-08-05 HISTORY — PX: ROBOTIC ADRENALECTOMY: SHX6407

## 2015-08-05 LAB — TYPE AND SCREEN
ABO/RH(D): A POS
Antibody Screen: NEGATIVE

## 2015-08-05 LAB — HEMOGLOBIN AND HEMATOCRIT, BLOOD
HCT: 43 % (ref 39.0–52.0)
Hemoglobin: 14.3 g/dL (ref 13.0–17.0)

## 2015-08-05 SURGERY — ROBOTIC ADRENALECTOMY
Anesthesia: General | Laterality: Right

## 2015-08-05 MED ORDER — LACTATED RINGERS IV SOLN
INTRAVENOUS | Status: DC | PRN
  Administered 2015-08-05 (×2): via INTRAVENOUS

## 2015-08-05 MED ORDER — INDOMETHACIN 50 MG PO CAPS
50.0000 mg | ORAL_CAPSULE | Freq: Three times a day (TID) | ORAL | Status: DC | PRN
  Filled 2015-08-05: qty 1

## 2015-08-05 MED ORDER — HYDROCODONE-ACETAMINOPHEN 5-325 MG PO TABS: 1.0000 | ORAL_TABLET | Freq: Four times a day (QID) | ORAL | Status: DC | PRN

## 2015-08-05 MED ORDER — ROCURONIUM BROMIDE 100 MG/10ML IV SOLN
INTRAVENOUS | Status: DC | PRN
  Administered 2015-08-05 (×4): 10 mg via INTRAVENOUS
  Administered 2015-08-05: 40 mg via INTRAVENOUS

## 2015-08-05 MED ORDER — MIDAZOLAM HCL 2 MG/2ML IJ SOLN
INTRAMUSCULAR | Status: AC
  Filled 2015-08-05: qty 2

## 2015-08-05 MED ORDER — FENTANYL CITRATE (PF) 100 MCG/2ML IJ SOLN
INTRAMUSCULAR | Status: DC | PRN
  Administered 2015-08-05: 50 ug via INTRAVENOUS
  Administered 2015-08-05 (×2): 100 ug via INTRAVENOUS
  Administered 2015-08-05 (×2): 50 ug via INTRAVENOUS
  Administered 2015-08-05: 100 ug via INTRAVENOUS

## 2015-08-05 MED ORDER — HYDROMORPHONE HCL 1 MG/ML IJ SOLN
0.5000 mg | INTRAMUSCULAR | Status: DC | PRN
  Administered 2015-08-05 (×2): 1 mg via INTRAVENOUS
  Filled 2015-08-05 (×2): qty 1

## 2015-08-05 MED ORDER — OXYCODONE HCL 5 MG PO TABS
5.0000 mg | ORAL_TABLET | ORAL | Status: DC | PRN
  Administered 2015-08-06 (×3): 5 mg via ORAL
  Filled 2015-08-05 (×3): qty 1

## 2015-08-05 MED ORDER — ONDANSETRON HCL 4 MG/2ML IJ SOLN: 4.0000 mg | INTRAMUSCULAR | Status: DC | PRN

## 2015-08-05 MED ORDER — SUCCINYLCHOLINE CHLORIDE 20 MG/ML IJ SOLN
INTRAMUSCULAR | Status: DC | PRN
  Administered 2015-08-05: 100 mg via INTRAVENOUS

## 2015-08-05 MED ORDER — DEXTROSE-NACL 5-0.45 % IV SOLN
INTRAVENOUS | Status: DC
  Administered 2015-08-05: 23:00:00 via INTRAVENOUS
  Administered 2015-08-05: 1000 mL via INTRAVENOUS
  Administered 2015-08-06: 06:00:00 via INTRAVENOUS

## 2015-08-05 MED ORDER — FENTANYL CITRATE (PF) 100 MCG/2ML IJ SOLN
INTRAMUSCULAR | Status: AC
  Filled 2015-08-05: qty 2

## 2015-08-05 MED ORDER — MIDAZOLAM HCL 5 MG/5ML IJ SOLN
INTRAMUSCULAR | Status: DC | PRN
  Administered 2015-08-05: 2 mg via INTRAVENOUS

## 2015-08-05 MED ORDER — CEFAZOLIN SODIUM-DEXTROSE 2-3 GM-% IV SOLR
INTRAVENOUS | Status: AC
  Filled 2015-08-05: qty 50

## 2015-08-05 MED ORDER — DEXAMETHASONE SODIUM PHOSPHATE 10 MG/ML IJ SOLN
INTRAMUSCULAR | Status: AC
  Filled 2015-08-05: qty 1

## 2015-08-05 MED ORDER — HYDROMORPHONE HCL 2 MG/ML IJ SOLN
INTRAMUSCULAR | Status: AC
  Filled 2015-08-05: qty 1

## 2015-08-05 MED ORDER — KETOROLAC TROMETHAMINE 30 MG/ML IJ SOLN
INTRAMUSCULAR | Status: AC
  Filled 2015-08-05: qty 1

## 2015-08-05 MED ORDER — LACTATED RINGERS IR SOLN
Status: DC | PRN
  Administered 2015-08-05: 150 mL

## 2015-08-05 MED ORDER — LACTATED RINGERS IV SOLN
INTRAVENOUS | Status: DC
Start: 2015-08-05 — End: 2015-08-05

## 2015-08-05 MED ORDER — POLYVINYL ALCOHOL 1.4 % OP SOLN
1.0000 [drp] | Freq: Three times a day (TID) | OPHTHALMIC | Status: DC | PRN
  Filled 2015-08-05: qty 15

## 2015-08-05 MED ORDER — STERILE WATER FOR IRRIGATION IR SOLN
Status: DC | PRN
  Administered 2015-08-05: 100 mL

## 2015-08-05 MED ORDER — PROPOFOL 10 MG/ML IV BOLUS
INTRAVENOUS | Status: DC | PRN
  Administered 2015-08-05: 200 mg via INTRAVENOUS

## 2015-08-05 MED ORDER — ALLOPURINOL 300 MG PO TABS
300.0000 mg | ORAL_TABLET | Freq: Every day | ORAL | Status: DC
Start: 2015-08-06 — End: 2015-08-06
  Administered 2015-08-06: 300 mg via ORAL
  Filled 2015-08-05: qty 1

## 2015-08-05 MED ORDER — ROCURONIUM BROMIDE 100 MG/10ML IV SOLN
INTRAVENOUS | Status: AC
  Filled 2015-08-05: qty 1

## 2015-08-05 MED ORDER — PROPOFOL 10 MG/ML IV BOLUS
INTRAVENOUS | Status: AC
  Filled 2015-08-05: qty 20

## 2015-08-05 MED ORDER — LIDOCAINE HCL (CARDIAC) 20 MG/ML IV SOLN
INTRAVENOUS | Status: DC | PRN
  Administered 2015-08-05: 100 mg via INTRAVENOUS

## 2015-08-05 MED ORDER — HYDROMORPHONE HCL 1 MG/ML IJ SOLN
INTRAMUSCULAR | Status: DC | PRN
  Administered 2015-08-05 (×4): 0.5 mg via INTRAVENOUS

## 2015-08-05 MED ORDER — BUSPIRONE HCL 15 MG PO TABS
15.0000 mg | ORAL_TABLET | Freq: Two times a day (BID) | ORAL | Status: DC
  Administered 2015-08-05 – 2015-08-06 (×2): 15 mg via ORAL
  Filled 2015-08-05 (×3): qty 1

## 2015-08-05 MED ORDER — CEFAZOLIN SODIUM-DEXTROSE 2-3 GM-% IV SOLR
2.0000 g | INTRAVENOUS | Status: AC
  Administered 2015-08-05: 2 g via INTRAVENOUS

## 2015-08-05 MED ORDER — FENTANYL CITRATE (PF) 250 MCG/5ML IJ SOLN
INTRAMUSCULAR | Status: AC
  Filled 2015-08-05: qty 5

## 2015-08-05 MED ORDER — MEPERIDINE HCL 25 MG/ML IJ SOLN
INTRAMUSCULAR | Status: DC | PRN
  Administered 2015-08-05: 25 mg via INTRAVENOUS

## 2015-08-05 MED ORDER — ONDANSETRON HCL 4 MG/2ML IJ SOLN
INTRAMUSCULAR | Status: DC | PRN
  Administered 2015-08-05: 4 mg via INTRAVENOUS

## 2015-08-05 MED ORDER — DIPHENHYDRAMINE HCL 12.5 MG/5ML PO ELIX: 12.5000 mg | ORAL_SOLUTION | Freq: Four times a day (QID) | ORAL | Status: DC | PRN

## 2015-08-05 MED ORDER — LACTATED RINGERS IV SOLN
INTRAVENOUS | Status: DC | PRN
  Administered 2015-08-05: 13:00:00 via INTRAVENOUS

## 2015-08-05 MED ORDER — SUGAMMADEX SODIUM 200 MG/2ML IV SOLN
INTRAVENOUS | Status: AC
  Filled 2015-08-05: qty 2

## 2015-08-05 MED ORDER — SUGAMMADEX SODIUM 200 MG/2ML IV SOLN
INTRAVENOUS | Status: DC | PRN
  Administered 2015-08-05: 200 mg via INTRAVENOUS

## 2015-08-05 MED ORDER — MEPERIDINE HCL 50 MG/ML IJ SOLN
INTRAMUSCULAR | Status: AC
  Filled 2015-08-05: qty 1

## 2015-08-05 MED ORDER — FINASTERIDE 5 MG PO TABS
5.0000 mg | ORAL_TABLET | Freq: Every day | ORAL | Status: DC
  Administered 2015-08-05 – 2015-08-06 (×2): 5 mg via ORAL
  Filled 2015-08-05 (×2): qty 1

## 2015-08-05 MED ORDER — PROMETHAZINE HCL 25 MG/ML IJ SOLN: 6.2500 mg | INTRAMUSCULAR | Status: DC | PRN

## 2015-08-05 MED ORDER — BUPIVACAINE-EPINEPHRINE (PF) 0.25% -1:200000 IJ SOLN
INTRAMUSCULAR | Status: AC
  Filled 2015-08-05: qty 30

## 2015-08-05 MED ORDER — LACTATED RINGERS IV SOLN: INTRAVENOUS | Status: DC

## 2015-08-05 MED ORDER — ONDANSETRON HCL 4 MG/2ML IJ SOLN
INTRAMUSCULAR | Status: AC
  Filled 2015-08-05: qty 2

## 2015-08-05 MED ORDER — DEXAMETHASONE SODIUM PHOSPHATE 10 MG/ML IJ SOLN
INTRAMUSCULAR | Status: DC | PRN
  Administered 2015-08-05: 10 mg via INTRAVENOUS

## 2015-08-05 MED ORDER — DIPHENHYDRAMINE HCL 50 MG/ML IJ SOLN: 12.5000 mg | Freq: Four times a day (QID) | INTRAMUSCULAR | Status: DC | PRN

## 2015-08-05 MED ORDER — SODIUM CHLORIDE 0.9 % IJ SOLN
INTRAMUSCULAR | Status: AC
  Filled 2015-08-05: qty 50

## 2015-08-05 MED ORDER — SPIRITUS FRUMENTI
1.0000 | Freq: Three times a day (TID) | ORAL | Status: DC
  Administered 2015-08-06 (×2): 1 via ORAL
  Filled 2015-08-05 (×5): qty 1

## 2015-08-05 MED ORDER — KETOROLAC TROMETHAMINE 30 MG/ML IJ SOLN
30.0000 mg | Freq: Four times a day (QID) | INTRAMUSCULAR | Status: DC
  Administered 2015-08-05 – 2015-08-06 (×5): 30 mg via INTRAVENOUS
  Filled 2015-08-05 (×8): qty 1

## 2015-08-05 MED ORDER — BUPIVACAINE LIPOSOME 1.3 % IJ SUSP
20.0000 mL | Freq: Once | INTRAMUSCULAR | Status: AC
  Administered 2015-08-05: 20 mL
  Filled 2015-08-05: qty 20

## 2015-08-05 MED ORDER — LIDOCAINE HCL (CARDIAC) 20 MG/ML IV SOLN
INTRAVENOUS | Status: AC
  Filled 2015-08-05: qty 5

## 2015-08-05 MED ORDER — FENTANYL CITRATE (PF) 100 MCG/2ML IJ SOLN
25.0000 ug | INTRAMUSCULAR | Status: DC | PRN
  Administered 2015-08-05: 50 ug via INTRAVENOUS

## 2015-08-05 MED ORDER — MEPERIDINE HCL 50 MG/ML IJ SOLN: 6.2500 mg | INTRAMUSCULAR | Status: DC | PRN

## 2015-08-05 SURGICAL SUPPLY — 61 items
APPLICATOR SURGIFLO ENDO (HEMOSTASIS) IMPLANT
BAG RETRIEVAL 10 (BASKET)
BAG RETRIEVAL 10MM (BASKET)
CHLORAPREP W/TINT 26ML (MISCELLANEOUS) ×3 IMPLANT
CLIP LIGATING HEM O LOK PURPLE (MISCELLANEOUS) ×3 IMPLANT
CLIP LIGATING HEMO LOK XL GOLD (MISCELLANEOUS) IMPLANT
CLIP LIGATING HEMO O LOK GREEN (MISCELLANEOUS) ×3 IMPLANT
CORDS BIPOLAR (ELECTRODE) IMPLANT
COVER SURGICAL LIGHT HANDLE (MISCELLANEOUS) ×3 IMPLANT
COVER TIP SHEARS 8 DVNC (MISCELLANEOUS) ×1 IMPLANT
COVER TIP SHEARS 8MM DA VINCI (MISCELLANEOUS) ×2
CUTTER ECHEON FLEX ENDO 45 340 (ENDOMECHANICALS) ×3 IMPLANT
DECANTER SPIKE VIAL GLASS SM (MISCELLANEOUS) ×3 IMPLANT
DRAIN CHANNEL 15F RND FF 3/16 (WOUND CARE) IMPLANT
DRAPE ARM DVNC X/XI (DISPOSABLE) ×4 IMPLANT
DRAPE COLUMN DVNC XI (DISPOSABLE) ×1 IMPLANT
DRAPE DA VINCI XI ARM (DISPOSABLE) ×8
DRAPE DA VINCI XI COLUMN (DISPOSABLE) ×2
DRAPE INCISE IOBAN 66X45 STRL (DRAPES) ×3 IMPLANT
DRAPE LAPAROSCOPIC ABDOMINAL (DRAPES) ×3 IMPLANT
DRAPE SHEET LG 3/4 BI-LAMINATE (DRAPES) ×3 IMPLANT
DRAPE WARM FLUID 44X44 (DRAPE) IMPLANT
ELECT PENCIL ROCKER SW 15FT (MISCELLANEOUS) ×3 IMPLANT
ELECT REM PT RETURN 9FT ADLT (ELECTROSURGICAL) ×3
ELECTRODE REM PT RTRN 9FT ADLT (ELECTROSURGICAL) ×1 IMPLANT
EVACUATOR SILICONE 100CC (DRAIN) IMPLANT
GLOVE BIOGEL M STRL SZ7.5 (GLOVE) ×9 IMPLANT
GOWN STRL REUS W/TWL LRG LVL3 (GOWN DISPOSABLE) ×6 IMPLANT
KIT BASIN OR (CUSTOM PROCEDURE TRAY) ×3 IMPLANT
LIQUID BAND (GAUZE/BANDAGES/DRESSINGS) ×6 IMPLANT
NEEDLE INSUFFLATION 14GA 120MM (NEEDLE) ×3 IMPLANT
PORT ACCESS TROCAR AIRSEAL 12 (TROCAR) ×1 IMPLANT
PORT ACCESS TROCAR AIRSEAL 5M (TROCAR) ×2
POSITIONER SURGICAL ARM (MISCELLANEOUS) IMPLANT
POUCH ENDO CATCH II 15MM (MISCELLANEOUS) ×3 IMPLANT
RELOAD STAPLER WHITE 60MM (STAPLE) IMPLANT
RELOAD WH ECHELON 45 (STAPLE) ×15 IMPLANT
SEAL CANN UNIV 5-8 DVNC XI (MISCELLANEOUS) ×4 IMPLANT
SEAL XI 5MM-8MM UNIVERSAL (MISCELLANEOUS) ×8
SET TRI-LUMEN FLTR TB AIRSEAL (TUBING) ×3 IMPLANT
SET TUBE IRRIG SUCTION NO TIP (IRRIGATION / IRRIGATOR) ×3 IMPLANT
SOLUTION ELECTROLUBE (MISCELLANEOUS) ×3 IMPLANT
SPONGE LAP 4X18 X RAY DECT (DISPOSABLE) ×3 IMPLANT
STAPLE ECHEON FLEX 60 POW ENDO (STAPLE) IMPLANT
STAPLER RELOAD WHITE 60MM (STAPLE)
SURGIFLO W/THROMBIN 8M KIT (HEMOSTASIS) IMPLANT
SUT ETHILON 3 0 PS 1 (SUTURE) IMPLANT
SUT MNCRL AB 4-0 PS2 18 (SUTURE) ×6 IMPLANT
SUT PDS AB 1 CT1 27 (SUTURE) ×9 IMPLANT
SUT VICRYL 0 UR6 27IN ABS (SUTURE) IMPLANT
SYR BULB IRRIGATION 50ML (SYRINGE) ×3 IMPLANT
SYS BAG RETRIEVAL 10MM (BASKET)
SYSTEM BAG RETRIEVAL 10MM (BASKET) IMPLANT
TOWEL OR NON WOVEN STRL DISP B (DISPOSABLE) ×3 IMPLANT
TRAY FOLEY W/METER SILVER 14FR (SET/KITS/TRAYS/PACK) IMPLANT
TRAY FOLEY W/METER SILVER 16FR (SET/KITS/TRAYS/PACK) ×3 IMPLANT
TRAY LAPAROSCOPIC (CUSTOM PROCEDURE TRAY) ×3 IMPLANT
TROCAR 12M 150ML BLUNT (TROCAR) ×3 IMPLANT
TROCAR BLADELESS OPT 5 100 (ENDOMECHANICALS) ×3 IMPLANT
TROCAR XCEL 12X100 BLDLESS (ENDOMECHANICALS) IMPLANT
WATER STERILE IRR 1500ML POUR (IV SOLUTION) IMPLANT

## 2015-08-05 NOTE — Op Note (Signed)
NAMEELIODORO, DAHMS NO.:  [id number]  MEDICAL RECORD NO.:  [id number]  LOCATION:  46                         FACILITY:  [facility]  PHYSICIAN:  Alexis Frock, MD     DATE OF BIRTH:  [birth year]  DATE OF PROCEDURE: 08/05/2015                              OPERATIVE REPORT  DIAGNOSIS:  Large right adrenal mass.  PROCEDURE:  Robotic-assisted laparoscopic right adrenalectomy.  ASSISTANT:  Debbrah Alar, PA.  ESTIMATED BLOOD LOSS:  100 mL.  COMPLICATION:  None.  SPECIMENS:  Right adrenal with mass.  FINDINGS:  Fairly large right adrenal mass occupying majority of the lateral adrenal.  INDICATION:  [name] Brown is a 50 year old gentleman who was found incidentally on hematuria evaluation to have a relatively large right adrenal mass just over 6 cm.  He underwent endocrine evaluation which was negative for functional tumor.  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  Informed consent was obtained and placed in medical record.  PROCEDURE IN DETAIL:  The patient being [name] Brown and procedure being right robotic adrenalectomy was confirmed.  Procedure was carried out.  Time-out was performed.  Intravenous antibiotics were administered.  General endotracheal anesthesia was introduced. The patient was placed into a right side up full flank position, applying 15 degrees of stable flexion, superior arm elevator, axillary roll, sequential compression devices, bottom leg bent, top leg straight.  He was further fastened on operating table using 3-inch tape over foam padding across the supraxiphoid chest and pelvis.  Sterile field was created by prepping  the patient's right flank and abdomen using chlorhexidine gluconate after clipper shaving.  Next, a high-flow low pressure pneumoperitoneum was obtained using Veress technique in the right lower quadrant having passed the aspiration and drop test.  Next, an 8-mm robotic camera port was placed in position approximately 1 handbreadth superior lateral to the umbilicus.  Laparoscopic examination of peritoneal cavity revealed no significant adhesions, no visceral injury.  The patient did have a relatively large liver with some mild nodular changes consistent with likely early cirrhosis and no evidence of ascites or gross liver masses.  Next, the 5 mm subcostal port was placed through which a self-locking grasper was used to place the liver in gentle superior traction and locking the posterior abdominal wall. This allowed retraction of liver away from the anterior surface of the kidney and the adrenal gland.  Additional ports were placed as follows: Right subcostal 8-mm robotic port, right paramedian inferior 8-mm robotic port approximately 1 handbreadth superior to the pubic ramus; a right far lateral 8-mm robotic port approximately 4 fingerbreadths superior medial to the anterior iliac spine; and 2 assistant ports both 12 mm, 1 approximately 2 fingerbreadths above the camera port and another 2 fingerbreadths below the camera port.  Robot was docked and passed through electronic checks.  Initial attention was directed at development of the retroperitoneum.  Incision was made lateral to the ascending colon from the area of  the cecum towards the area of the hepatic flexure, and the colon was carefully swept medially.  The kidney was identified and placed on gentle lateral traction.  Dissection proceeded anterior medial to this directly onto Gerota's.  The duodenum was Kocherized medially.  The right renal vein was then visualized and traced to the inferior vena cava which was then mobilized such  that its lateral edge could be clearly seen from the adrenal vein up to the inferior liver edge and allowing circumferential visualization of the renal hilum.  Next, posterior perineum was incised directly at the inferior margin of the liver and careful dissection was performed in a plane superior to the right adrenal and mass freeing it away from the liver taking great care to avoid any liver injury or significant diaphragmatic injury which did not occur with further mobility of the right adrenal mass.  Medial dissection was performed directly lateral to the inferior vena cava.  Exquisitely careful dissection was performed in this area mobilizing the medial aspect of the adrenal mass away from the lateral and posterior edge of the inferior vena cava.  A dominant adrenal vein was clearly noted as well as several medial tributaries. He has controlled using vascular load stapler taking great care to avoid injury to the inferior vena cava which did not occur.  This allowed further mobilization of the mass and adrenal gland and lateral most edge was then carefully dissected away from the posterior abdominal wall and diaphragm such that dissection behind the mass could take place. Following these maneuvers, only the attachments inferior to the adrenal mass between it and the kidney were left.  The superior pole of the kidney was identified, dissection proceeded directly onto this freeing it away from the inferior aspect of the mass towards the area of the renal vein.  There were numerous small tributaries between the renal vessels and the inferior aspect of the mass was controlled using vascular load stapler taking care to avoid injury to the renal hilum which did not occur.  This completely freed up the right adrenal gland mass specimen which was placed into an extra large EndoCatch bag for later retrieval.  The area of adrenalectomy was carefully inspected and found to be hemostatic.  There  was no evidence of excessive renal mobilization and it was not felt that nephrectomy would be warranted. The liver was taken off the lateral traction and allowed to descend back into anatomic position.  There was no evidence of liver injury. Robot was then undocked.  Specimen was retrieved by extending the previous 2 assistant port sites midline and removing the adrenalectomy specimen set aside for pathology.  The extraction site was closed with fascia using figure-of-eight PDS x7 followed by reapproximation of Scarpa's with running Vicryl.  All incision sites were infiltrated with dilute lyophilized Marcaine and closed in level of skin using subcuticular Monocryl followed by Dermabond and procedure terminated.  The patient tolerated the procedure well with no immediate periprocedural complications.  The patient was taken to the Postanesthesia Care Unit in stable condition.          ______________________________ Alexis Frock, MD     TM/MEDQ  D:  08/05/2015  T:  08/05/2015  Job:  [id number]

## 2015-08-05 NOTE — Brief Op Note (Signed)
08/05/2015  4:09 PM  PATIENT:  [name] Brown  50 y.o. male  PRE-OPERATIVE DIAGNOSIS:  RIGHT ADRENAL MASS  POST-OPERATIVE DIAGNOSIS:  RIGHT ADRENAL MASS  PROCEDURE:  Procedure(s): XI ROBOTIC RIGHT ADRENALECTOMY  (Right)  SURGEON:  Surgeon(s) and Role:    * Alexis Frock, MD - Primary  PHYSICIAN ASSISTANT:   ASSISTANTS: Debbrah Alar, PA   ANESTHESIA:   local and general  EBL:  Total I/O In: 1900 [I.V.:1900] Out: 300 [Urine:150; Blood:150]  BLOOD ADMINISTERED:none  DRAINS: foley to gravity   LOCAL MEDICATIONS USED:  MARCAINE     SPECIMEN:  Source of Specimen:  Rt adrenal gland with mass  DISPOSITION OF SPECIMEN:  PATHOLOGY  COUNTS:  YES  TOURNIQUET:  * No tourniquets in log *  DICTATION: .Other Dictation: Dictation Number [id number]  PLAN OF CARE: Admit to inpatient   PATIENT DISPOSITION:  PACU - hemodynamically stable.   Delay start of Pharmacological VTE agent (>24hrs) due to surgical blood loss or risk of bleeding: yes

## 2015-08-05 NOTE — Anesthesia Procedure Notes (Signed)
Procedure Name: Intubation Date/Time: 08/05/2015 1:29 PM Performed by: Maxwell Caul Pre-anesthesia Checklist: Patient identified, Emergency Drugs available, Suction available and Patient being monitored Patient Re-evaluated:Patient Re-evaluated prior to inductionOxygen Delivery Method: Circle System Utilized Preoxygenation: Pre-oxygenation with 100% oxygen Intubation Type: IV induction Ventilation: Mask ventilation without difficulty Laryngoscope Size: Mac and 4 Grade View: Grade I Tube type: Oral Tube size: 7.5 mm Number of attempts: 1 Airway Equipment and Method: Stylet Placement Confirmation: ETT inserted through vocal cords under direct vision,  positive ETCO2 and breath sounds checked- equal and bilateral Secured at: 21 cm Tube secured with: Tape Dental Injury: Teeth and Oropharynx as per pre-operative assessment

## 2015-08-05 NOTE — Discharge Instructions (Signed)

## 2015-08-05 NOTE — H&P (Signed)
[name] Brown is an 50 y.o. male.    Chief Complaint: Pre-OP Rt Robotic Adrenalectomy  HPI:         1 - Gross hematuria - visible blood in urine x several as well as semen. Treated empirically for "prostatitis" but UCX negative and no WBC on UA. He has h/o Gout. Non smoker. No chronic solvent exposture.CT Urogram 05/2015 with right adrenal mass, but no renal lesions. Cysto 06/2014 with large friable prostate only.  2 - Prostate screening - No FHX prostate cancer.  04/2015 PSA 1.98 (PCP labs) / DRE 35 gm smooth.  3 - Right Adrenal Mass  - 6.3cm right solid fat-containing adrenal mass incidetnal by CT 05/2015. No hypernatremia. Serum metanephrines normal.   4 - Enlarged Prostate - 60gm by CT volume calculation 2016, but significant intraluminal hypertrophy with mucosal friability (BPH) by cysto. Admits to progressive obstructive symtpoms. Starting finasteride 06/2015.   PMH sig for alcohol abuse, Gout, HLD, knee arthroscopy. 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 His PCP is [name] Pacas MD with [facility].   Today " [name] Brown" is seen to proceed with right adrenalectomy for his large rt adrenal mass. Functional studies negative.   Past Medical History  Diagnosis Date  . Gout   . Depression   . Hyperlipidemia   . Anxiety   . Alcohol abuse   . Allergy     seasonal but not treated   . History of hematuria   . Enlarged prostate   . Nocturia   . Right adrenal mass [facility])     Past Surgical History  Procedure Laterality Date  . Knee surgery  1989  . Nerve cautery      jaw bone     Family History  Problem Relation Age of Onset  . Alcohol abuse Maternal Grandfather   . Hyperlipidemia Father   . Heart failure Father   . Colon cancer Neg Hx   . Stomach cancer Neg Hx   . Rectal cancer Neg Hx   . Colon polyps Neg Hx    Social History:  reports that he has never smoked. He has never used smokeless tobacco. He reports that he drinks about 45.0  oz of alcohol per week. He reports that he uses illicit drugs (Marijuana).  Allergies:  Allergies  Allergen Reactions  . Viibryd [Vilazodone Hcl]     Pt lost sense of taste     No prescriptions prior to admission    No results found for this or any previous visit (from the past 48 hour(s)). No results found.  Review of Systems  Constitutional: Negative.   HENT: Negative.   Eyes: Negative.   Respiratory: Negative.   Cardiovascular: Negative.   Gastrointestinal: Negative.   Genitourinary: Negative.   Musculoskeletal: Negative.   Skin: Negative.   Neurological: Negative.   Endo/Heme/Allergies: Negative.   Psychiatric/Behavioral: Negative.     There were no vitals taken for this visit. Physical Exam  Constitutional: He appears well-developed.  HENT:  Head: Normocephalic.  Eyes: Pupils are equal, round, and reactive to light.  Neck: Normal range of motion.  Cardiovascular: Normal rate.   Respiratory: Effort normal.  GI: Soft.  Genitourinary: Penis normal.  No CVAT  Musculoskeletal: Normal range of motion.  Neurological: He is alert.  Skin: Skin is warm.  Psychiatric: He has a normal mood and affect. His behavior is normal. Judgment and thought content normal.     Assessment/Plan       1 -  Gross hematuria - eval with exam, labs, CT, cysto withtou worrisome etiology, likely BPH / friable prostate source, plas as per below. Explained need for repeat eval for future gross episodes only.   2 - Prostate screening - up to date this year, rec continue annual screening until age 36 or so as average risk.  3 -  Right Adrenal Mass  - likely benign lesion, no sig abnormalities on endocrine survey,  but size >4cm concerning as there is certainly some risk of adrenal carinoma as well as progression and problems form mass effect as grows. He wants to proceed with adrenelectomy.  We discussed the role of partial v. total adrenalectomy with the overall goal of complete surgical  excision (negative margins) and better staging / diagnosis.  I also discussed the possible use of indocyanine gree dye as and intraoperative adjunctive measure to help better identify diseased tissue. We specifically discussed that with removal of a single adrenal gland there would be unlikely change in overall healthy adrenal function, though some degree of Addisonism possible. We then discussed surgical approaches including robotic and open techniques with robotic associated with a shorter convalescence. I showed the patient on their abdomen the approximately 4-6 incision (trocar) sites as well as presumed extraction sites with robotic approach as well as possible open incision sites. We specifically addressed that there may be need to alter operative plans according to intraopertive findings including conversion to open procedure. We discussed specific peri-operative risks including bleeding, infection, deep vein thrombosis, pulmonary embolism, compartment syndrome, nuropathy / neuropraxia, heart attack, stroke, death, as well as long-term risks such as non-cure / need for additional therapy and need for imaging and lab based post-op surveillance protocols. We discussed typical hospital course of approximately 2 day hospitalization, and typical post-hospital course with return to most non-strenuous activities by 2 weeks and ability to return to most jobs and more strenuous activity such as exercise by 6 weeks.   After this lengthy and detail discussion, including answering all of the patient's questions to their satisfaction, they have chosen to proceed today as planned.  4 - Enlarged Prostate - discussed observation, finasteride, TURP in detail and we both agree on continue finasteride for goal of prevent prostatic fossa bleeding and help with mild obsttructive sympsoms now and prevent progression.  Alexis Frock, MD 08/05/2015, 6:29 AM

## 2015-08-05 NOTE — Transfer of Care (Signed)
Immediate Anesthesia Transfer of Care Note  Patient: [name] Brown  Procedure(s) Performed: Procedure(s): XI ROBOTIC RIGHT ADRENALECTOMY  (Right)  Patient Location: PACU  Anesthesia Type:General  Level of Consciousness:  sedated, patient cooperative and responds to stimulation  Airway & Oxygen Therapy:Patient Spontanous Breathing and Patient connected to face mask oxgen  Post-op Assessment:  Report given to PACU RN and Post -op Vital signs reviewed and stable  Post vital signs:  Reviewed and stable  Last Vitals:  Filed Vitals:   08/05/15 1139 08/05/15 1630  BP: 143/98 156/100  Pulse: 79 87  Temp: 37 C 36.8 C  Resp: 18 21    Complications: No apparent anesthesia complications

## 2015-08-05 NOTE — Anesthesia Preprocedure Evaluation (Addendum)
Anesthesia Evaluation  Patient identified by MRN, date of birth, ID band Patient awake    Reviewed: Allergy & Precautions, NPO status , Patient's Chart, lab work & pertinent test results  Airway Mallampati: II  TM Distance: >3 FB Neck ROM: Full    Dental no notable dental hx.    Pulmonary neg pulmonary ROS,    Pulmonary exam normal breath sounds clear to auscultation       Cardiovascular negative cardio ROS Normal cardiovascular exam Rhythm:Regular Rate:Normal     Neuro/Psych Anxiety Depression negative neurological ROS     GI/Hepatic negative GI ROS, (+)     substance abuse  alcohol use,   Endo/Other  negative endocrine ROS  Renal/GU negative Renal ROS  negative genitourinary   Musculoskeletal negative musculoskeletal ROS (+)   Abdominal   Peds negative pediatric ROS (+)  Hematology negative hematology ROS (+) H/o thrombocytopenia: plt 161k   Anesthesia Other Findings   Reproductive/Obstetrics negative OB ROS                            Anesthesia Physical Anesthesia Plan  ASA: II  Anesthesia Plan: General   Post-op Pain Management:    Induction: Intravenous  Airway Management Planned: Oral ETT  Additional Equipment:   Intra-op Plan:   Post-operative Plan: Extubation in OR  Informed Consent: I have reviewed the patients History and Physical, chart, labs and discussed the procedure including the risks, benefits and alternatives for the proposed anesthesia with the patient or authorized representative who has indicated his/her understanding and acceptance.   Dental advisory given  Plan Discussed with: CRNA  Anesthesia Plan Comments:         Anesthesia Quick Evaluation

## 2015-08-05 NOTE — Anesthesia Postprocedure Evaluation (Signed)
Anesthesia Post Note  Patient: [name] Brown  Procedure(s) Performed: Procedure(s) (LRB): XI ROBOTIC RIGHT ADRENALECTOMY  (Right)  Patient location during evaluation: PACU Anesthesia Type: General Level of consciousness: awake and alert Pain management: pain level controlled Vital Signs Assessment: post-procedure vital signs reviewed and stable Respiratory status: spontaneous breathing, nonlabored ventilation, respiratory function stable and patient connected to nasal cannula oxygen Cardiovascular status: blood pressure returned to baseline and stable Postop Assessment: no signs of nausea or vomiting Anesthetic complications: no    Last Vitals:  Filed Vitals:   08/05/15 1715 08/05/15 1730  BP: 135/96 133/93  Pulse: 71 73  Temp:  36.6 C  Resp: 14 14    Last Pain:  Filed Vitals:   08/05/15 1752  PainSc: 0-No pain                 Sritha Chauncey L

## 2015-08-06 ENCOUNTER — Encounter (HOSPITAL_COMMUNITY): Payer: Self-pay | Admitting: Urology

## 2015-08-06 LAB — BASIC METABOLIC PANEL
ANION GAP: 8 (ref 5–15)
CHLORIDE: 105 mmol/L (ref 101–111)
CO2: 25 mmol/L (ref 22–32)
Calcium: 8.4 mg/dL — ABNORMAL LOW (ref 8.9–10.3)
Creatinine, Ser: 0.69 mg/dL (ref 0.61–1.24)
GFR calc Af Amer: >60 mL/min (ref >60)
Glucose, Bld: 109 mg/dL — ABNORMAL HIGH (ref 65–99)
POTASSIUM: 3.6 mmol/L (ref 3.5–5.1)
SODIUM: 138 mmol/L (ref 135–145)

## 2015-08-06 LAB — HEMOGLOBIN AND HEMATOCRIT, BLOOD
HEMATOCRIT: 42 % (ref 39.0–52.0)
HEMOGLOBIN: 13.7 g/dL (ref 13.0–17.0)

## 2015-08-06 MED ORDER — BISACODYL 10 MG RE SUPP
10.0000 mg | Freq: Once | RECTAL | Status: AC
  Administered 2015-08-06: 10 mg via RECTAL
  Filled 2015-08-06: qty 1

## 2015-08-06 NOTE — Discharge Summary (Signed)
Date of admission: 08/05/2015  Date of discharge: 08/06/2015  Admission diagnosis: RIGHT adrenal mass  Discharge diagnosis: Same as above  Secondary diagnoses:  Past Medical History  Diagnosis Date  . Gout   . Depression   . Hyperlipidemia   . Anxiety   . Alcohol abuse   . Allergy     seasonal but not treated   . History of hematuria   . Enlarged prostate   . Nocturia   . Right adrenal mass [facility])      Hospital Course:   RIGHT adrenal mass- s/p RIGHT robot-assisted laparoscopic adrenalectomy on 08/05/15. Uncomplicated post-operative course. Transferred to the floor on POD 0, diet slowly advanced. Foley removed morning of POD 1. By the afternoon of POD 1, his pain controlled on oral medications, ambulatory, tollerating PO intake and felt to be adequate for discharge.   Laboratory values:  Recent Labs  08/05/15 1704 08/06/15 0516  HGB 14.3 13.7  HCT 43.0 42.0    Recent Labs  08/06/15 0516  CREATININE 0.69    Disposition: Home  Discharge instruction: The patient was instructed to be ambulatory but told to refrain from heavy lifting, strenuous activity, or driving.   1- Drain Sites - You may have some mild persistent drainage from old drain site for several days, this is normal. This can be covered with cotton gauze for convenience.  2 - Stiches - Your stitches are all dissolvable. You may notice a "loose thread" at your incisions, these are normal and require no intervention. You may cut them flush to the skin with fingernail clippers if needed for comfort.  3 - Diet - No restrictions  4 - Activity - No heavy lifting / straining (any activities that require valsalva or "bearing down") x 4 weeks. Otherwise, no restrictions.  5 - Bathing - You may shower immediately. Do not take a bath or get into swimming pool where incision sites are submersed in water x 4 weeks.   6 - When to Call the Doctor - Call MD for any fever >102, any acute wound problems, or any severe  nausea / vomiting. You can call the [facility] [phone]) 24 hours a day 365 days a year. It will roll-over to the answering service and on-call physician after hours.   Discharge medications:    Medication List    STOP taking these medications        FISH OIL PO     multivitamin with minerals Tabs tablet      TAKE these medications        allopurinol 300 MG tablet  Commonly known as:  ZYLOPRIM  Take 300 mg by mouth daily.     busPIRone 15 MG tablet  Commonly known as:  BUSPAR  Take 1 tablet (15 mg total) by mouth 2 (two) times daily.     finasteride 5 MG tablet  Commonly known as:  PROSCAR  Take 5 mg by mouth daily.     HYDROcodone-acetaminophen 5-325 MG tablet  Commonly known as:  NORCO  Take 1-2 tablets by mouth every 6 (six) hours as needed.     indomethacin 50 MG capsule  Commonly known as:  INDOCIN  Take 1 capsule (50 mg total) by mouth 3 (three) times daily as needed (gout).     polyvinyl alcohol 1.4 % ophthalmic solution  Commonly known as:  LIQUIFILM TEARS  Place 1 drop into both eyes 3 (three) times daily as needed for dry eyes.     zolpidem  5 MG tablet  Commonly known as:  AMBIEN  Take 0.5-1 tablets (2.5-5 mg total) by mouth at bedtime as needed for sleep.        Followup:      Follow-up Information    Follow up with Alexis Frock, MD On 08/20/2015.   Specialty:  Urology   Why:  at 11:15   Contact information:   [address] [place] [address] [phone]

## 2015-08-06 NOTE — Care Management Note (Signed)
Case Management Note  Patient Details  Name: [name] Brown MRN: [id number] Date of Birth: [birth year]  Subjective/Objective:49 y/o m admitted w/R adrenal mass. POD#1 lap r adrenalectomy. From home.                    Action/Plan:d/c plan home.   Expected Discharge Date:                 Expected Discharge Plan:  Home/Self Care  In-House Referral:     Discharge planning Services  CM Consult  Post Acute Care Choice:    Choice offered to:     DME Arranged:    DME Agency:     HH Arranged:    HH Agency:     Status of Service:  In process, will continue to follow  Medicare Important Message Given:    Date Medicare IM Given:    Medicare IM give by:    Date Additional Medicare IM Given:    Additional Medicare Important Message give by:     If discussed at [place] of Stay Meetings, dates discussed:    Additional Comments:  Dessa Phi, RN 08/06/2015, 3:47 PM

## 2015-08-26 ENCOUNTER — Other Ambulatory Visit: Payer: Self-pay

## 2015-08-26 DIAGNOSIS — G4700 Insomnia, unspecified: Secondary | ICD-10-CM

## 2015-08-26 MED ORDER — ZOLPIDEM TARTRATE 5 MG PO TABS: 2.5000 mg | ORAL_TABLET | Freq: Every evening | ORAL | Status: DC | PRN

## 2015-11-11 ENCOUNTER — Encounter: Payer: Self-pay | Admitting: Family Medicine

## 2015-11-11 ENCOUNTER — Ambulatory Visit (INDEPENDENT_AMBULATORY_CARE_PROVIDER_SITE_OTHER): Payer: 59 | Admitting: Family Medicine

## 2015-11-11 VITALS — BP 147/94 | HR 83 | Wt 187.0 lb

## 2015-11-11 DIAGNOSIS — F411 Generalized anxiety disorder: Secondary | ICD-10-CM

## 2015-11-11 DIAGNOSIS — G4700 Insomnia, unspecified: Secondary | ICD-10-CM

## 2015-11-11 DIAGNOSIS — A630 Anogenital (venereal) warts: Secondary | ICD-10-CM

## 2015-11-11 MED ORDER — PODOFILOX 0.5 % EX SOLN: CUTANEOUS | Status: DC

## 2015-11-11 MED ORDER — BUSPIRONE HCL 15 MG PO TABS: 15.0000 mg | ORAL_TABLET | Freq: Two times a day (BID) | ORAL | Status: DC

## 2015-11-11 MED ORDER — ZOLPIDEM TARTRATE 5 MG PO TABS: 2.5000 mg | ORAL_TABLET | Freq: Every evening | ORAL | Status: DC | PRN

## 2015-11-11 NOTE — Progress Notes (Signed)
CC: [name] Brown is a 50 y.o. male is here for f/u anxiety   Subjective: HPI:  Follow insomnia: He is pleased with the effect of Ambien. It's helping him fall asleep and stay asleep without any side effects. If he forgets to take it he'll end up staring at the ceiling for about half an hour until he realizes that he forgot to take the medication. Denies sleepwalking or grogginess  Follow-up anxiety: He ran a BuSpar on Sunday and has noticed that he was getting great benefit from this medication. He describes irritability and nervousness that occurs at work and on his days off. He tells me that BuSpar seemed to be helping greatly with this without any known side effects.  He is asking for refill on Condylox. For about 2-3 months he was applying it weekly and as prescribed. He was able to get rid of 2-3 genital warts. One is much smaller but is about to run out of the medication and wants to start another 2-3 month regimen. Denies skin lesions elsewhere.   Review Of Systems Outlined In HPI  Past Medical History  Diagnosis Date  . Gout   . Depression   . Hyperlipidemia   . Anxiety   . Alcohol abuse   . Allergy     seasonal but not treated   . History of hematuria   . Enlarged prostate   . Nocturia   . Right adrenal mass [facility])     Past Surgical History  Procedure Laterality Date  . Knee surgery  1989  . Nerve cautery      jaw bone   . Robotic adrenalectomy Right 08/05/2015    Procedure: XI ROBOTIC RIGHT ADRENALECTOMY ;  Surgeon: Alexis Frock, MD;  Location: [facility];  Service: Urology;  Laterality: Right;   Family History  Problem Relation Age of Onset  . Alcohol abuse Maternal Grandfather   . Hyperlipidemia Father   . Heart failure Father   . Colon cancer Neg Hx   . Stomach cancer Neg Hx   . Rectal cancer Neg Hx   . Colon polyps Neg Hx     Social History   Social History  . Marital Status: Single    Spouse Name: N/A  . Number of Children: N/A  . Years of Education:  N/A   Occupational History  . Not on file.   Social History Main Topics  . Smoking status: Never Smoker   . Smokeless tobacco: Never Used  . Alcohol Use: 45.0 oz/week    60 Cans of beer, 15 Shots of liquor per week     Comment: approx 1/2 to 1/5th vodka daily and beers; pt states takes approx 6 drinks per day of beer  . Drug Use: Yes    Special: Marijuana     Comment: couple of times a month  . Sexual Activity:    Partners: Female   Other Topics Concern  . Not on file   Social History Narrative     Objective: BP 147/94 mmHg  Pulse 83  Wt 187 lb (84.823 kg)  Vital signs reviewed. General: Alert and Oriented, No Acute Distress HEENT: Pupils equal, round, reactive to light. Conjunctivae clear.  External ears unremarkable.  Moist mucous membranes. Lungs: Clear and comfortable work of breathing, speaking in full sentences without accessory muscle use. Cardiac: Regular rate and rhythm.  Neuro: CN II-XII grossly intact, gait normal. Extremities: No peripheral edema.  Strong peripheral pulses.  Mental Status: No depression, anxiety, nor agitation.  Logical though process. Skin: Warm and dry.  Assessment & Plan: [name] Brown was seen today for f/u anxiety.  Diagnoses and all orders for this visit:  Anxiety state -     busPIRone (BUSPAR) 15 MG tablet; Take 1 tablet (15 mg total) by mouth 2 (two) times daily.  Insomnia -     zolpidem (AMBIEN) 5 MG tablet; Take 0.5-1 tablets (2.5-5 mg total) by mouth at bedtime as needed for sleep.  Genital warts  Other orders -     Cancel: busPIRone (BUSPAR) 15 MG tablet; Take 1 tablet (15 mg total) by mouth 2 (two) times daily. -     podofilox (CONDYLOX) 0.5 % external solution; Apply twice a day to growths for three days then rest for four days. Repeat weekly until growths are gone.   Anxiety: Uncontrolled off BuSpar restart former regimen Insomnia: Controlled with Ambien Genital warts: Restart former regimen of Condylox.   Return in about  6 months (around 05/13/2016) for Anxiety and Insomnia.

## 2016-01-04 ENCOUNTER — Encounter: Payer: Self-pay | Admitting: Family Medicine

## 2016-01-04 DIAGNOSIS — K432 Incisional hernia without obstruction or gangrene: Secondary | ICD-10-CM | POA: Insufficient documentation

## 2016-01-12 ENCOUNTER — Ambulatory Visit: Payer: Self-pay | Admitting: General Surgery

## 2016-01-12 NOTE — H&P (Signed)
[name] Brown. [name] Brown 01/12/2016 4:09 PM Location: [facility] Patient #: [id number] DOB: 1966/04/20 Single / Language: [place] / Race: White Male  History of Present Illness Odis Hollingshead MD; 01/12/2016 4:56 PM) Patient words: new pt, incisional hernia.  The patient is a 50 year old male.   Note:He is referred by Dr. Tresa Moore for evaluation of an incisional hernia. He underwent a laparoscopic right adrenalectomy approximately 5 months ago. He started noticing a bulge around the upper portion of the extraction site midline incision that has progressively increased in size. It intermittently causes some sharp pain but he has no obstructive symptoms. He was sent over here for further evaluation and recommendation of treatment.  Other Problems (April Staton, CMA; 01/12/2016 4:09 PM) Alcohol Abuse Anxiety Disorder Cirrhosis Of Liver Depression Enlarged Prostate Hemorrhoids Hepatitis High blood pressure Hypercholesterolemia  Past Surgical History (April Staton, CMA; 01/12/2016 4:09 PM) Colon Polyp Removal - Colonoscopy  Diagnostic Studies History (April Staton, [place]; 01/12/2016 4:09 PM) Colonoscopy within last year  Allergies (April Staton, CMA; 01/12/2016 4:11 PM) No Known Drug Allergies 01/12/2016  Medication History (April Staton, CMA; 01/12/2016 4:17 PM) Allopurinol (300MG  Tablet, Oral) Active. Finasteride (5MG  Tablet, Oral) Active. BusPIRone HCl (15MG  Tablet, Oral) Active. Podofilox (External) Specific dose unknown - Active. Ambien (Oral) Specific dose unknown - Active. Indomethacin (50MG  Capsule, Oral) Active.  Social History (April Staton, [place]; 01/12/2016 4:09 PM) Alcohol use Heavy alcohol use. Caffeine use Carbonated beverages. Illicit drug use Prefer to discuss with provider. Tobacco use Never smoker.  Family History (April Staton, [place]; 01/12/2016 4:09 PM) Diabetes Mellitus Brother. Heart Disease Father. Hypertension  Father.     Review of Systems (April Staton CMA; 01/12/2016 4:09 PM) General Present- Night Sweats. Not Present- Appetite Loss, Chills, Fatigue, Fever, Weight Gain and Weight Loss. Skin Present- Dryness. Not Present- Change in Wart/Mole, Hives, Jaundice, New Lesions, Non-Healing Wounds, Rash and Ulcer. HEENT Not Present- Earache, Hearing Loss, Hoarseness, Nose Bleed, Oral Ulcers, Ringing in the Ears, Seasonal Allergies, Sinus Pain, Sore Throat, Visual Disturbances, Wears glasses/contact lenses and Yellow Eyes. Respiratory Present- Snoring. Not Present- Bloody sputum, Chronic Cough, Difficulty Breathing and Wheezing. Breast Not Present- Breast Mass, Breast Pain, Nipple Discharge and Skin Changes. Cardiovascular Not Present- Chest Pain, Difficulty Breathing Lying Down, Leg Cramps, Palpitations, Rapid Heart Rate, Shortness of Breath and Swelling of Extremities. Gastrointestinal Present- Chronic diarrhea. Not Present- Abdominal Pain, Bloating, Bloody Stool, Change in Bowel Habits, Constipation, Difficulty Swallowing, Excessive gas, Gets full quickly at meals, Hemorrhoids, Indigestion, Nausea, Rectal Pain and Vomiting. Male Genitourinary Present- Frequency and Urgency. Not Present- Blood in Urine, Change in Urinary Stream, Impotence, Nocturia, Painful Urination and Urine Leakage. Musculoskeletal Not Present- Back Pain, Joint Pain, Joint Stiffness, Muscle Pain, Muscle Weakness and Swelling of Extremities. Neurological Present- Decreased Memory. Not Present- Fainting, Headaches, Numbness, Seizures, Tingling, Tremor, Trouble walking and Weakness. Psychiatric Present- Anxiety and Depression. Not Present- Bipolar, Change in Sleep Pattern, Fearful and Frequent crying. Endocrine Not Present- Cold Intolerance, Excessive Hunger, Hair Changes, Heat Intolerance, Hot flashes and New Diabetes. Hematology Not Present- Blood Thinners, Easy Bruising, Excessive bleeding, Gland problems, HIV and Persistent  Infections.  Vitals (April Staton CMA; 01/12/2016 4:18 PM) 01/12/2016 4:17 PM Weight: 186 lb Height: 67in Body Surface Area: 1.96 m Body Mass Index: 29.13 kg/m  Temp.: 97.31F(Oral)  Pulse: 91 (Regular)  P.OX: 97% (Room air) BP: 140/100 (Sitting, Left Arm, Standard)      Physical Exam Odis Hollingshead MD; 01/12/2016 4:54 PM)  The physical exam findings are as  follows: Note:General: Overweight male in NAD. Pleasant and cooperative.  HEENT: [place]/AT, no external nasal or ear masses, mucous membranes are moist  CV: RRR, no murmur, no edema  CHEST: Breath sounds equal and clear. Respirations nonlabored.  ABDOMEN: Soft, nontender, nondistended, no masses, limited upper midline incision with bulge and palpable 2-2.5 cm fascial defect at the superior aspect.  SKIN: No jaundice.  NEUROLOGIC: Alert and oriented, answers questions appropriately, normal gait and station.  PSYCHIATRIC: Normal mood, affect , and behavior.    Assessment & Plan Odis Hollingshead MD; 01/12/2016 4:57 PM)  Fatima Blank HERNIA, WITHOUT OBSTRUCTION OR GANGRENE (K43.2) Impression: This is slowly getting larger and becoming symptomatic. I recommended laparoscopic repair with mesh and he is agreeable to this.  Plan laparoscopic incisional hernia repair with mesh. I have discussed the procedure, risks, and aftercare. Risks include but are not limited to bleeding, infection, wound healing problems, anesthesia, recurrence, accidental injury to intra-abdominal organs-such as intestine, liver, spleen, bladder, etc. We also discussed the rare complication of mesh rejection. All questions were answered.  Jackolyn Confer, MD

## 2016-01-25 ENCOUNTER — Other Ambulatory Visit: Payer: Self-pay | Admitting: Family Medicine

## 2016-01-26 NOTE — Patient Instructions (Addendum)
[name] Brown  01/26/2016   Your procedure is scheduled on: 02-02-16  Report to [facility] Main  Entrance take [facility]  elevators to 3rd floor to  [place] at  930AM.  Call this number if you have problems the morning of surgery [phone]   Remember: ONLY 1 PERSON MAY GO WITH YOU TO SHORT STAY TO GET  READY MORNING OF [place].  Do not eat food or drink liquids :After Midnight.     Take these medicines the morning of surgery with A SIP OF WATER: allopurinol, buspiron (buspar) DO NOT TAKE ANY DIABETIC MEDICATIONS DAY OF YOUR SURGERY                               You may not have any metal on your body including hair pins and              piercings  Do not wear jewelry, make-up, lotions, powders or perfumes, deodorant             Do not wear nail polish.  Do not shave  48 hours prior to surgery.              Men may shave face and neck.   Do not bring valuables to the hospital. [place].  Contacts, dentures or bridgework may not be worn into surgery.  Leave suitcase in the car. After surgery it may be brought to your room.     Patients discharged the day of surgery will not be allowed to drive home.  Name and phone number of your driver:  Special Instructions: N/A              Please read over the following fact sheets you were given: _____________________________________________________________________             [facility] - Preparing for Surgery Before surgery, you can play an important role.  Because skin is not sterile, your skin needs to be as free of germs as possible.  You can reduce the number of germs on your skin by washing with CHG (chlorahexidine gluconate) soap before surgery.  CHG is an antiseptic cleaner which kills germs and bonds with the skin to continue killing germs even after washing. Please DO NOT use if you have an allergy to CHG or antibacterial soaps.  If your skin  becomes reddened/irritated stop using the CHG and inform your nurse when you arrive at Short Stay. Do not shave (including legs and underarms) for at least 48 hours prior to the first CHG shower.  You may shave your face/neck. Please follow these instructions carefully:  1.  Shower with CHG Soap the night before surgery and the  morning of Surgery.  2.  If you choose to wash your hair, wash your hair first as usual with your  normal  shampoo.  3.  After you shampoo, rinse your hair and body thoroughly to remove the  shampoo.                           4.  Use CHG as you would any other liquid soap.  You can apply chg directly  to the skin and wash  Gently with a scrungie or clean washcloth.  5.  Apply the CHG Soap to your body ONLY FROM THE NECK DOWN.   Do not use on face/ open                           Wound or open sores. Avoid contact with eyes, ears mouth and genitals (private parts).                       Wash face,  Genitals (private parts) with your normal soap.             6.  Wash thoroughly, paying special attention to the area where your surgery  will be performed.  7.  Thoroughly rinse your body with warm water from the neck down.  8.  DO NOT shower/wash with your normal soap after using and rinsing off  the CHG Soap.                9.  Pat yourself dry with a clean towel.            10.  Wear clean pajamas.            11.  Place clean sheets on your bed the night of your first shower and do not  sleep with pets. Day of Surgery : Do not apply any lotions/deodorants the morning of surgery.  Please wear clean clothes to the hospital/surgery center.  FAILURE TO FOLLOW THESE INSTRUCTIONS MAY RESULT IN THE CANCELLATION OF YOUR SURGERY PATIENT SIGNATURE_________________________________  NURSE SIGNATURE__________________________________  ________________________________________________________________________

## 2016-01-26 NOTE — Progress Notes (Signed)
ekg 07-28-15 epic

## 2016-01-26 NOTE — Telephone Encounter (Signed)
Approved and sent to [facility].

## 2016-01-27 ENCOUNTER — Encounter (HOSPITAL_COMMUNITY)
Admission: RE | Admit: 2016-01-27 | Discharge: 2016-01-27 | Disposition: A | Discharge status: Home / Self Care | Payer: 59 | Source: Ambulatory Visit | Attending: General Surgery | Admitting: General Surgery

## 2016-01-27 ENCOUNTER — Encounter (HOSPITAL_COMMUNITY): Payer: Self-pay

## 2016-01-27 DIAGNOSIS — Z01812 Encounter for preprocedural laboratory examination: Secondary | ICD-10-CM | POA: Insufficient documentation

## 2016-01-27 LAB — COMPREHENSIVE METABOLIC PANEL WITH GFR
ALT: 83 U/L — ABNORMAL HIGH (ref 17–63)
AST: 146 U/L — ABNORMAL HIGH (ref 15–41)
Albumin: 4.4 g/dL (ref 3.5–5.0)
Alkaline Phosphatase: 76 U/L (ref 38–126)
Anion gap: 11 (ref 5–15)
BUN: 10 mg/dL (ref 6–20)
CO2: 24 mmol/L (ref 22–32)
Calcium: 9.1 mg/dL (ref 8.9–10.3)
Chloride: 99 mmol/L — ABNORMAL LOW (ref 101–111)
Creatinine, Ser: 0.6 mg/dL — ABNORMAL LOW (ref 0.61–1.24)
GFR calc Af Amer: >60 mL/min
GFR calc non Af Amer: >60 mL/min
Glucose, Bld: 109 mg/dL — ABNORMAL HIGH (ref 65–99)
Potassium: 4.2 mmol/L (ref 3.5–5.1)
Sodium: 134 mmol/L — ABNORMAL LOW (ref 135–145)
Total Bilirubin: 1.2 mg/dL (ref 0.3–1.2)
Total Protein: 8.2 g/dL — ABNORMAL HIGH (ref 6.5–8.1)

## 2016-01-27 LAB — CBC WITH DIFFERENTIAL/PLATELET
BASOS PCT: 0 %
Basophils Absolute: 0 10*3/uL (ref 0.0–0.1)
EOS PCT: 2 %
Eosinophils Absolute: 0.1 10*3/uL (ref 0.0–0.7)
HEMATOCRIT: 46.2 % (ref 39.0–52.0)
Hemoglobin: 15.8 g/dL (ref 13.0–17.0)
LYMPHS ABS: 1.3 10*3/uL (ref 0.7–4.0)
Lymphocytes Relative: 55 %
MCH: 33.5 pg (ref 26.0–34.0)
MCHC: 34.2 g/dL (ref 30.0–36.0)
MCV: 98.1 fL (ref 78.0–100.0)
MONO ABS: 0.3 10*3/uL (ref 0.1–1.0)
MONOS PCT: 11 %
Neutro Abs: 0.8 10*3/uL — ABNORMAL LOW (ref 1.7–7.7)
Neutrophils Relative %: 32 %
PLATELETS: 138 10*3/uL — AB (ref 150–400)
RBC: 4.71 MIL/uL (ref 4.22–5.81)
RDW: 12.7 % (ref 11.5–15.5)
WBC: 2.5 10*3/uL — ABNORMAL LOW (ref 4.0–10.5)

## 2016-01-27 LAB — PATHOLOGIST SMEAR REVIEW

## 2016-02-02 ENCOUNTER — Encounter (HOSPITAL_COMMUNITY): Payer: Self-pay | Admitting: *Deleted

## 2016-02-02 ENCOUNTER — Observation Stay (HOSPITAL_COMMUNITY)
Admission: RE | Admit: 2016-02-02 | Discharge: 2016-02-03 | Disposition: A | Discharge status: Home / Self Care | Payer: 59 | Source: Ambulatory Visit | Attending: General Surgery | Admitting: General Surgery

## 2016-02-02 ENCOUNTER — Encounter (HOSPITAL_COMMUNITY)
Admission: RE | Disposition: A | Discharge status: Home / Self Care | Payer: Self-pay | Source: Ambulatory Visit | Attending: General Surgery

## 2016-02-02 ENCOUNTER — Ambulatory Visit (HOSPITAL_COMMUNITY): Payer: 59 | Admitting: Anesthesiology

## 2016-02-02 DIAGNOSIS — K432 Incisional hernia without obstruction or gangrene: Secondary | ICD-10-CM | POA: Diagnosis present

## 2016-02-02 DIAGNOSIS — N400 Enlarged prostate without lower urinary tract symptoms: Secondary | ICD-10-CM | POA: Insufficient documentation

## 2016-02-02 DIAGNOSIS — K7460 Unspecified cirrhosis of liver: Secondary | ICD-10-CM | POA: Insufficient documentation

## 2016-02-02 DIAGNOSIS — F1020 Alcohol dependence, uncomplicated: Secondary | ICD-10-CM | POA: Insufficient documentation

## 2016-02-02 DIAGNOSIS — F329 Major depressive disorder, single episode, unspecified: Secondary | ICD-10-CM | POA: Insufficient documentation

## 2016-02-02 DIAGNOSIS — F419 Anxiety disorder, unspecified: Secondary | ICD-10-CM | POA: Diagnosis not present

## 2016-02-02 DIAGNOSIS — Z79899 Other long term (current) drug therapy: Secondary | ICD-10-CM | POA: Diagnosis not present

## 2016-02-02 HISTORY — PX: INSERTION OF MESH: SHX5868

## 2016-02-02 HISTORY — PX: INCISIONAL HERNIA REPAIR: SHX193

## 2016-02-02 LAB — TYPE AND SCREEN
ABO/RH(D): A POS
ANTIBODY SCREEN: NEGATIVE

## 2016-02-02 SURGERY — REPAIR, HERNIA, INCISIONAL, LAPAROSCOPIC
Anesthesia: General

## 2016-02-02 MED ORDER — CEFAZOLIN SODIUM-DEXTROSE 2-4 GM/100ML-% IV SOLN
INTRAVENOUS | Status: AC
  Filled 2016-02-02: qty 100

## 2016-02-02 MED ORDER — ONDANSETRON HCL 4 MG/2ML IJ SOLN
INTRAMUSCULAR | Status: AC
  Filled 2016-02-02: qty 2

## 2016-02-02 MED ORDER — PROPOFOL 10 MG/ML IV BOLUS
INTRAVENOUS | Status: AC
  Filled 2016-02-02: qty 20

## 2016-02-02 MED ORDER — MIDAZOLAM HCL 2 MG/2ML IJ SOLN
INTRAMUSCULAR | Status: AC
  Filled 2016-02-02: qty 2

## 2016-02-02 MED ORDER — PROPOFOL 10 MG/ML IV BOLUS
INTRAVENOUS | Status: DC | PRN
  Administered 2016-02-02: 200 mg via INTRAVENOUS

## 2016-02-02 MED ORDER — CHLORHEXIDINE GLUCONATE CLOTH 2 % EX PADS: 6.0000 | MEDICATED_PAD | Freq: Once | CUTANEOUS | Status: DC

## 2016-02-02 MED ORDER — LACTATED RINGERS IV SOLN: INTRAVENOUS | Status: DC

## 2016-02-02 MED ORDER — PROMETHAZINE HCL 25 MG/ML IJ SOLN: 6.2500 mg | INTRAMUSCULAR | Status: DC | PRN

## 2016-02-02 MED ORDER — MEPERIDINE HCL 50 MG/ML IJ SOLN: 6.2500 mg | INTRAMUSCULAR | Status: DC | PRN

## 2016-02-02 MED ORDER — LIDOCAINE HCL (CARDIAC) 20 MG/ML IV SOLN
INTRAVENOUS | Status: AC
  Filled 2016-02-02: qty 5

## 2016-02-02 MED ORDER — BUPIVACAINE HCL (PF) 0.5 % IJ SOLN
INTRAMUSCULAR | Status: DC | PRN
  Administered 2016-02-02: 4 mL

## 2016-02-02 MED ORDER — SUCCINYLCHOLINE CHLORIDE 20 MG/ML IJ SOLN
INTRAMUSCULAR | Status: DC | PRN
  Administered 2016-02-02: 100 mg via INTRAVENOUS

## 2016-02-02 MED ORDER — FENTANYL CITRATE (PF) 100 MCG/2ML IJ SOLN
INTRAMUSCULAR | Status: DC | PRN
  Administered 2016-02-02 (×4): 100 ug via INTRAVENOUS
  Administered 2016-02-02: 50 ug via INTRAVENOUS

## 2016-02-02 MED ORDER — MORPHINE SULFATE (PF) 2 MG/ML IV SOLN
2.0000 mg | INTRAVENOUS | Status: DC | PRN
  Administered 2016-02-02: 4 mg via INTRAVENOUS
  Administered 2016-02-02: 2 mg via INTRAVENOUS
  Administered 2016-02-02 – 2016-02-03 (×2): 4 mg via INTRAVENOUS
  Filled 2016-02-02: qty 1
  Filled 2016-02-02 (×3): qty 2

## 2016-02-02 MED ORDER — FINASTERIDE 5 MG PO TABS
5.0000 mg | ORAL_TABLET | Freq: Every evening | ORAL | Status: DC
  Administered 2016-02-02: 5 mg via ORAL
  Filled 2016-02-02: qty 1

## 2016-02-02 MED ORDER — LACTATED RINGERS IV SOLN
INTRAVENOUS | Status: DC
  Administered 2016-02-02: 1000 mL via INTRAVENOUS
  Administered 2016-02-02: 13:00:00 via INTRAVENOUS

## 2016-02-02 MED ORDER — ONDANSETRON HCL 4 MG/2ML IJ SOLN
INTRAMUSCULAR | Status: DC | PRN
  Administered 2016-02-02: 4 mg via INTRAVENOUS

## 2016-02-02 MED ORDER — ADULT MULTIVITAMIN W/MINERALS CH: 1.0000 | ORAL_TABLET | Freq: Every day | ORAL | Status: DC

## 2016-02-02 MED ORDER — BUSPIRONE HCL 15 MG PO TABS
15.0000 mg | ORAL_TABLET | Freq: Two times a day (BID) | ORAL | Status: DC
  Administered 2016-02-02: 15 mg via ORAL
  Filled 2016-02-02 (×2): qty 1

## 2016-02-02 MED ORDER — LIDOCAINE HCL (CARDIAC) 20 MG/ML IV SOLN
INTRAVENOUS | Status: DC | PRN
  Administered 2016-02-02: 100 mg via INTRAVENOUS

## 2016-02-02 MED ORDER — ONDANSETRON 4 MG PO TBDP: 4.0000 mg | ORAL_TABLET | Freq: Four times a day (QID) | ORAL | Status: DC | PRN

## 2016-02-02 MED ORDER — BUPIVACAINE HCL (PF) 0.5 % IJ SOLN
INTRAMUSCULAR | Status: AC
  Filled 2016-02-02: qty 30

## 2016-02-02 MED ORDER — FENTANYL CITRATE (PF) 100 MCG/2ML IJ SOLN
INTRAMUSCULAR | Status: AC
  Filled 2016-02-02: qty 2

## 2016-02-02 MED ORDER — CEFAZOLIN SODIUM-DEXTROSE 2-4 GM/100ML-% IV SOLN
2.0000 g | INTRAVENOUS | Status: AC
  Administered 2016-02-02: 2 g via INTRAVENOUS
  Filled 2016-02-02: qty 100

## 2016-02-02 MED ORDER — ONDANSETRON HCL 4 MG/2ML IJ SOLN: 4.0000 mg | INTRAMUSCULAR | Status: DC | PRN

## 2016-02-02 MED ORDER — MIDAZOLAM HCL 5 MG/5ML IJ SOLN
INTRAMUSCULAR | Status: DC | PRN
  Administered 2016-02-02: 2 mg via INTRAVENOUS

## 2016-02-02 MED ORDER — FENTANYL CITRATE (PF) 250 MCG/5ML IJ SOLN
INTRAMUSCULAR | Status: AC
  Filled 2016-02-02: qty 5

## 2016-02-02 MED ORDER — CEFAZOLIN SODIUM-DEXTROSE 2-4 GM/100ML-% IV SOLN
2.0000 g | Freq: Three times a day (TID) | INTRAVENOUS | Status: AC
  Administered 2016-02-02: 2 g via INTRAVENOUS
  Filled 2016-02-02: qty 100

## 2016-02-02 MED ORDER — SUGAMMADEX SODIUM 200 MG/2ML IV SOLN
INTRAVENOUS | Status: DC | PRN
  Administered 2016-02-02: 200 mg via INTRAVENOUS

## 2016-02-02 MED ORDER — HYDROMORPHONE HCL 1 MG/ML IJ SOLN
INTRAMUSCULAR | Status: AC
  Filled 2016-02-02: qty 1

## 2016-02-02 MED ORDER — ROCURONIUM BROMIDE 100 MG/10ML IV SOLN
INTRAVENOUS | Status: DC | PRN
  Administered 2016-02-02: 40 mg via INTRAVENOUS
  Administered 2016-02-02: 10 mg via INTRAVENOUS

## 2016-02-02 MED ORDER — ROCURONIUM BROMIDE 100 MG/10ML IV SOLN
INTRAVENOUS | Status: AC
  Filled 2016-02-02: qty 1

## 2016-02-02 MED ORDER — ZOLPIDEM TARTRATE 5 MG PO TABS
2.5000 mg | ORAL_TABLET | Freq: Every evening | ORAL | Status: DC | PRN
Start: 2016-02-02 — End: 2016-02-03

## 2016-02-02 MED ORDER — SUGAMMADEX SODIUM 200 MG/2ML IV SOLN
INTRAVENOUS | Status: AC
  Filled 2016-02-02: qty 2

## 2016-02-02 MED ORDER — ALLOPURINOL 300 MG PO TABS: 300.0000 mg | ORAL_TABLET | Freq: Every day | ORAL | Status: DC

## 2016-02-02 MED ORDER — DEXAMETHASONE SODIUM PHOSPHATE 10 MG/ML IJ SOLN
INTRAMUSCULAR | Status: DC | PRN
  Administered 2016-02-02: 10 mg via INTRAVENOUS

## 2016-02-02 MED ORDER — HYDROMORPHONE HCL 1 MG/ML IJ SOLN
0.2500 mg | INTRAMUSCULAR | Status: DC | PRN
  Administered 2016-02-02 (×4): 0.5 mg via INTRAVENOUS

## 2016-02-02 MED ORDER — KCL IN DEXTROSE-NACL 20-5-0.45 MEQ/L-%-% IV SOLN
INTRAVENOUS | Status: DC
  Administered 2016-02-02: 17:00:00 via INTRAVENOUS
  Filled 2016-02-02 (×2): qty 1000

## 2016-02-02 MED ORDER — DEXAMETHASONE SODIUM PHOSPHATE 10 MG/ML IJ SOLN
INTRAMUSCULAR | Status: AC
  Filled 2016-02-02: qty 1

## 2016-02-02 MED ORDER — OXYCODONE HCL 5 MG PO TABS
5.0000 mg | ORAL_TABLET | ORAL | Status: DC | PRN
  Administered 2016-02-02 – 2016-02-03 (×3): 10 mg via ORAL
  Filled 2016-02-02 (×3): qty 2

## 2016-02-02 SURGICAL SUPPLY — 52 items
BANDAGE ADH SHEER 1  50/CT (GAUZE/BANDAGES/DRESSINGS) ×3 IMPLANT
BENZOIN TINCTURE PRP APPL 2/3 (GAUZE/BANDAGES/DRESSINGS) ×3 IMPLANT
BINDER ABDOMINAL 12 ML 46-62 (SOFTGOODS) ×3 IMPLANT
CABLE HIGH FREQUENCY MONO STRZ (ELECTRODE) ×3 IMPLANT
CLOSURE WOUND 1/2 X4 (GAUZE/BANDAGES/DRESSINGS) ×1
CLOSURE WOUND 1/4X4 (GAUZE/BANDAGES/DRESSINGS) ×1
COVER SURGICAL LIGHT HANDLE (MISCELLANEOUS) ×3 IMPLANT
DECANTER SPIKE VIAL GLASS SM (MISCELLANEOUS) ×3 IMPLANT
DEVICE TROCAR PUNCTURE CLOSURE (ENDOMECHANICALS) ×3 IMPLANT
DISSECTOR BLUNT TIP ENDO 5MM (MISCELLANEOUS) IMPLANT
DRAPE INCISE IOBAN 66X45 STRL (DRAPES) ×3 IMPLANT
DRAPE LAPAROSCOPIC ABDOMINAL (DRAPES) IMPLANT
DRSG TEGADERM 2-3/8X2-3/4 SM (GAUZE/BANDAGES/DRESSINGS) IMPLANT
DRSG TEGADERM 4X4.75 (GAUZE/BANDAGES/DRESSINGS) ×3 IMPLANT
ELECT PENCIL ROCKER SW 15FT (MISCELLANEOUS) IMPLANT
ELECT REM PT RETURN 9FT ADLT (ELECTROSURGICAL) ×3
ELECTRODE REM PT RTRN 9FT ADLT (ELECTROSURGICAL) ×1 IMPLANT
GAUZE SPONGE 2X2 8PLY STRL LF (GAUZE/BANDAGES/DRESSINGS) ×1 IMPLANT
GLOVE BIOGEL PI IND STRL 7.0 (GLOVE) ×1 IMPLANT
GLOVE BIOGEL PI INDICATOR 7.0 (GLOVE) ×2
GLOVE ECLIPSE 8.0 STRL XLNG CF (GLOVE) ×3 IMPLANT
GLOVE INDICATOR 8.0 STRL GRN (GLOVE) ×6 IMPLANT
GOWN STRL REUS W/TWL LRG LVL3 (GOWN DISPOSABLE) ×3 IMPLANT
GOWN STRL REUS W/TWL XL LVL3 (GOWN DISPOSABLE) ×6 IMPLANT
IRRIG SUCT STRYKERFLOW 2 WTIP (MISCELLANEOUS)
IRRIGATION SUCT STRKRFLW 2 WTP (MISCELLANEOUS) IMPLANT
KIT BASIN OR (CUSTOM PROCEDURE TRAY) ×3 IMPLANT
LIQUID BAND (GAUZE/BANDAGES/DRESSINGS) IMPLANT
MARKER SKIN DUAL TIP RULER LAB (MISCELLANEOUS) ×3 IMPLANT
MESH VENTRALIGHT ST 6X8 (Mesh Specialty) ×2 IMPLANT
MESH VENTRLGHT ELLIPSE 8X6XMFL (Mesh Specialty) ×1 IMPLANT
NEEDLE INSUFFLATION 14GA 120MM (NEEDLE) ×3 IMPLANT
NEEDLE SPNL 22GX3.5 QUINCKE BK (NEEDLE) ×3 IMPLANT
NS IRRIG 1000ML POUR BTL (IV SOLUTION) ×3 IMPLANT
SCISSORS LAP 5X35 DISP (ENDOMECHANICALS) ×3 IMPLANT
SHEARS HARMONIC ACE PLUS 36CM (ENDOMECHANICALS) ×3 IMPLANT
SLEEVE XCEL OPT CAN 5 100 (ENDOMECHANICALS) ×3 IMPLANT
SPONGE GAUZE 2X2 STER 10/PKG (GAUZE/BANDAGES/DRESSINGS) ×2
STRIP CLOSURE SKIN 1/2X4 (GAUZE/BANDAGES/DRESSINGS) ×2 IMPLANT
STRIP CLOSURE SKIN 1/4X4 (GAUZE/BANDAGES/DRESSINGS) ×2 IMPLANT
SUT MNCRL AB 4-0 PS2 18 (SUTURE) ×3 IMPLANT
SUT NOVA NAB DX-16 0-1 5-0 T12 (SUTURE) ×3 IMPLANT
TACKER 5MM HERNIA 3.5CML NAB (ENDOMECHANICALS) ×6 IMPLANT
TOWEL OR 17X26 10 PK STRL BLUE (TOWEL DISPOSABLE) ×3 IMPLANT
TRAY FOLEY W/METER SILVER 14FR (SET/KITS/TRAYS/PACK) IMPLANT
TRAY FOLEY W/METER SILVER 16FR (SET/KITS/TRAYS/PACK) IMPLANT
TRAY LAPAROSCOPIC (CUSTOM PROCEDURE TRAY) ×3 IMPLANT
TROCAR BLADELESS OPT 5 100 (ENDOMECHANICALS) ×3 IMPLANT
TROCAR XCEL BLUNT TIP 100MML (ENDOMECHANICALS) IMPLANT
TROCAR XCEL NON-BLD 11X100MML (ENDOMECHANICALS) ×3 IMPLANT
TROCAR XCEL UNIV SLVE 11M 100M (ENDOMECHANICALS) IMPLANT
TUBING INSUF HEATED (TUBING) ×3 IMPLANT

## 2016-02-02 NOTE — Transfer of Care (Signed)
Immediate Anesthesia Transfer of Care Note  Patient: [name] Brown  Procedure(s) Performed: Procedure(s): LAPAROSCOPIC INCISIONAL HERNIA (N/A) INSERTION OF MESH (N/A)  Patient Location: PACU  Anesthesia Type:General  Level of Consciousness: sedated  Airway & Oxygen Therapy: Patient Spontanous Breathing and Patient connected to face mask oxygen  Post-op Assessment: Report given to RN and Post -op Vital signs reviewed and stable  Post vital signs: Reviewed and stable  Last Vitals:  Vitals:   02/02/16 0958  BP: (!) 138/93  Pulse: 77  Resp: 18  Temp: 37 C    Last Pain:  Vitals:   02/02/16 0958  TempSrc: Oral      Patients Stated Pain Goal: 3 ([id number])  Complications: No apparent anesthesia complications

## 2016-02-02 NOTE — Op Note (Signed)
Operative Note  [name] Brown male 50 y.o. 02/02/2016  PREOPERATIVE DX:  Incisional hernia  POSTOPERATIVE DX:  Same  PROCEDURE:   Laparoscopic repair of incisional hernia with mesh         Surgeon: Odis Hollingshead   Assistants: None  Anesthesia: General endotracheal anesthesia  Indications:   This is a 50 year old male who underwent a laparoscopic right adrenalectomy about 6 months ago. He noticed a bulge in the epigastric extraction site area. This is consistent with an incisional hernia. He now presents for repair.    Procedure Detail:  He was seen in the holding area. He was brought to the operating room placed supine on the operating table and general anesthetic was given. The hair on the abdominal wall was clipped and this area was widely sterilely prepped and draped. A timeout was performed.  He was placed in slight reverse Trendelenburg position. A 5 mm incision was made in the left subcostal area. Using a 5 mm Optiview trocar and laparoscope, access was gained into the peritoneal cavity. A pneumoperitoneum was created. Inspection of the area under the trocar demonstrated no evidence of bleeding or organ injury. There was an epigastric hernia noted. There were omental adhesions to the anterior abdominal wall just inferior to the hernia.  A 5 mm trocar was placed in the left lower quadrant. Using Harmonic scalpel, I divided the adhesions between the omentum and anterior abdominal wall. I then divided the falciform ligament with a Harmonic scalpel close to abdominal wall. Using a spinal needle, I located the edges of the hernia and the 12, 3, 6, and 9:00 positions. I then measured 4 cm away from this. I brought a piece of 15 cm by 20 cm Ventralite mesh onto the operative field.  It was cut to an appropriate size. 4 anchoring sutures of #1 Novafil were placed 4 quadrants of the mesh. A 5 mm trocar was placed in the right lower quadrant. The 5 mm left upper quadrant trocar was  upsized to an 11 mm trocar. The mesh was hydrated and then placed into the abdominal cavity through the 11 mm trocar. The nonadherent barrier side of the mesh was facing the viscera and the rough side facing the abdominal wall. 4 stab incisions were made at the 12, 3, 6, and 9:00 positions. The anchoring sutures were then pulled up across the fascial bridge and tied down anchoring the mesh to the abdominal wall. Using the spiral tacker the mesh was further anchored to the abdominal wall. Inspection of the repair demonstrated good coverage of the hernia with adequate overlap.  4 quadrant inspection of the abdominal cavity demonstrated no evidence of bleeding or organ injury. I looked carefully at the liver and noted micronodular changes. Given the fact that he has a heavy Etoh drinking history, slight low platelet count, and mild elevation of transaminases, this is clinically consistent with cirrhosis.  The CO2 gas was released and I watched the omentum approximate the mesh. The trocars were removed. All skin incisions were closed with 4-0 Monocryl subcuticular stitches. Steri-Strips and sterile dressing were applied. Abdominal binder was applied.  He tolerated the procedure without any apparent complications. He was taken to recovery room in satisfactory condition.   Estimated Blood Loss:  less than 100 mL               Complications:  * No complications entered in OR log *         Disposition: PACU - hemodynamically stable.  Condition: stable

## 2016-02-02 NOTE — H&P (View-Only) (Signed)
Rose Fillers. Schiavi 01/12/2016 4:09 PM Location: [facility] Patient #: [id number] DOB: Nov 04, 1965 Single / Language: [name] Brown / Race: White Male  History of Present Illness Odis Hollingshead MD; 01/12/2016 4:56 PM) Patient words: new pt, incisional hernia.  The patient is a 50 year old male.   Note:He is referred by Dr. Tresa Moore for evaluation of an incisional hernia. He underwent a laparoscopic right adrenalectomy approximately 5 months ago. He started noticing a bulge around the upper portion of the extraction site midline incision that has progressively increased in size. It intermittently causes some sharp pain but he has no obstructive symptoms. He was sent over here for further evaluation and recommendation of treatment.  Other Problems (April Staton, CMA; 01/12/2016 4:09 PM) Alcohol Abuse Anxiety Disorder Cirrhosis Of Liver Depression Enlarged Prostate Hemorrhoids Hepatitis High blood pressure Hypercholesterolemia  Past Surgical History (April Staton, CMA; 01/12/2016 4:09 PM) Colon Polyp Removal - Colonoscopy  Diagnostic Studies History (April Staton, [place]; 01/12/2016 4:09 PM) Colonoscopy within last year  Allergies (April Staton, CMA; 01/12/2016 4:11 PM) No Known Drug Allergies 01/12/2016  Medication History (April Staton, CMA; 01/12/2016 4:17 PM) Allopurinol (300MG  Tablet, Oral) Active. Finasteride (5MG  Tablet, Oral) Active. BusPIRone HCl (15MG  Tablet, Oral) Active. Podofilox (External) Specific dose unknown - Active. Ambien (Oral) Specific dose unknown - Active. Indomethacin (50MG  Capsule, Oral) Active.  Social History (April Staton, [place]; 01/12/2016 4:09 PM) Alcohol use Heavy alcohol use. Caffeine use Carbonated beverages. Illicit drug use Prefer to discuss with provider. Tobacco use Never smoker.  Family History (April Staton, [place]; 01/12/2016 4:09 PM) Diabetes Mellitus Brother. Heart Disease Father. Hypertension  Father.     Review of Systems (April Staton CMA; 01/12/2016 4:09 PM) General Present- Night Sweats. Not Present- Appetite Loss, Chills, Fatigue, Fever, Weight Gain and Weight Loss. Skin Present- Dryness. Not Present- Change in Wart/Mole, Hives, Jaundice, New Lesions, Non-Healing Wounds, Rash and Ulcer. HEENT Not Present- Earache, Hearing Loss, Hoarseness, Nose Bleed, Oral Ulcers, Ringing in the Ears, Seasonal Allergies, Sinus Pain, Sore Throat, Visual Disturbances, Wears glasses/contact lenses and Yellow Eyes. Respiratory Present- Snoring. Not Present- Bloody sputum, Chronic Cough, Difficulty Breathing and Wheezing. Breast Not Present- Breast Mass, Breast Pain, Nipple Discharge and Skin Changes. Cardiovascular Not Present- Chest Pain, Difficulty Breathing Lying Down, Leg Cramps, Palpitations, Rapid Heart Rate, Shortness of Breath and Swelling of Extremities. Gastrointestinal Present- Chronic diarrhea. Not Present- Abdominal Pain, Bloating, Bloody Stool, Change in Bowel Habits, Constipation, Difficulty Swallowing, Excessive gas, Gets full quickly at meals, Hemorrhoids, Indigestion, Nausea, Rectal Pain and Vomiting. Male Genitourinary Present- Frequency and Urgency. Not Present- Blood in Urine, Change in Urinary Stream, Impotence, Nocturia, Painful Urination and Urine Leakage. Musculoskeletal Not Present- Back Pain, Joint Pain, Joint Stiffness, Muscle Pain, Muscle Weakness and Swelling of Extremities. Neurological Present- Decreased Memory. Not Present- Fainting, Headaches, Numbness, Seizures, Tingling, Tremor, Trouble walking and Weakness. Psychiatric Present- Anxiety and Depression. Not Present- Bipolar, Change in Sleep Pattern, Fearful and Frequent crying. Endocrine Not Present- Cold Intolerance, Excessive Hunger, Hair Changes, Heat Intolerance, Hot flashes and New Diabetes. Hematology Not Present- Blood Thinners, Easy Bruising, Excessive bleeding, Gland problems, HIV and Persistent  Infections.  Vitals (April Staton CMA; 01/12/2016 4:18 PM) 01/12/2016 4:17 PM Weight: 186 lb Height: 67in Body Surface Area: 1.96 m Body Mass Index: 29.13 kg/m  Temp.: 97.41F(Oral)  Pulse: 91 (Regular)  P.OX: 97% (Room air) BP: 140/100 (Sitting, Left Arm, Standard)      Physical Exam Odis Hollingshead MD; 01/12/2016 4:54 PM)  The physical exam findings are as  follows: Note:General: Overweight male in NAD. Pleasant and cooperative.  HEENT: [place]/AT, no external nasal or ear masses, mucous membranes are moist  CV: RRR, no murmur, no edema  CHEST: Breath sounds equal and clear. Respirations nonlabored.  ABDOMEN: Soft, nontender, nondistended, no masses, limited upper midline incision with bulge and palpable 2-2.5 cm fascial defect at the superior aspect.  SKIN: No jaundice.  NEUROLOGIC: Alert and oriented, answers questions appropriately, normal gait and station.  PSYCHIATRIC: Normal mood, affect , and behavior.    Assessment & Plan Odis Hollingshead MD; 01/12/2016 4:57 PM)  Fatima Blank HERNIA, WITHOUT OBSTRUCTION OR GANGRENE (K43.2) Impression: This is slowly getting larger and becoming symptomatic. I recommended laparoscopic repair with mesh and he is agreeable to this.  Plan laparoscopic incisional hernia repair with mesh. I have discussed the procedure, risks, and aftercare. Risks include but are not limited to bleeding, infection, wound healing problems, anesthesia, recurrence, accidental injury to intra-abdominal organs-such as intestine, liver, spleen, bladder, etc. We also discussed the rare complication of mesh rejection. All questions were answered.  Jackolyn Confer, MD

## 2016-02-02 NOTE — Interval H&P Note (Signed)
History and Physical Interval Note:  02/02/2016 11:30 AM  [name] Brown  has presented today for surgery, with the diagnosis of incisional hernia  The various methods of treatment have been discussed with the patient and family. After consideration of risks, benefits and other options for treatment, the patient has consented to  Procedure(s): LAPAROSCOPIC INCISIONAL HERNIA (N/A) INSERTION OF MESH (N/A) as a surgical intervention .  The patient's history has been reviewed, patient examined, no change in status, stable for surgery.  I have reviewed the patient's chart and labs.  Questions were answered to the patient's satisfaction.     [name]

## 2016-02-02 NOTE — Anesthesia Procedure Notes (Signed)
Procedure Name: Intubation Date/Time: 02/02/2016 11:48 AM Performed by: Lind Covert Pre-anesthesia Checklist: Patient identified, Timeout performed, Emergency Drugs available, Suction available and Patient being monitored Patient Re-evaluated:Patient Re-evaluated prior to inductionOxygen Delivery Method: Circle system utilized Preoxygenation: Pre-oxygenation with 100% oxygen Intubation Type: IV induction and Cricoid Pressure applied Laryngoscope Size: Mac and 4 Grade View: Grade I Tube type: Oral Tube size: 7.5 mm Number of attempts: 1 Airway Equipment and Method: Stylet Placement Confirmation: ETT inserted through vocal cords under direct vision,  positive ETCO2 and breath sounds checked- equal and bilateral Secured at: 23 cm Tube secured with: Tape Dental Injury: Teeth and Oropharynx as per pre-operative assessment

## 2016-02-02 NOTE — Discharge Instructions (Addendum)
[facility] _______[facility]   HERNIA REPAIR: POST OP INSTRUCTIONS  Always review your discharge instruction sheet given to you by the facility where your surgery was performed. IF YOU HAVE DISABILITY OR FAMILY LEAVE FORMS, YOU MUST BRING THEM TO THE OFFICE FOR PROCESSING.   DO NOT GIVE THEM TO YOUR DOCTOR.  1. A  prescription for pain medication may be given to you upon discharge.  Take your pain medication as prescribed, if needed.  If narcotic pain medicine is not needed, then you may take  ibuprofen (Advil) as needed. 2. Take your usually prescribed medications unless otherwise directed. 3. If you need a refill on your pain medication, please contact your pharmacy.  They will contact our office to request authorization. Prescriptions will not be filled after 5 pm or on week-ends. 4. You should follow a light, lowfat diet. Be sure to include lots of fluids daily.  Resume your normal diet the day after surgery. 5. Most patients will experience some swelling and bruising around the area.  Swelling and bruising can take several days to resolve.  6. It is common to experience some constipation if taking pain medication after surgery.  Increasing fluid intake and taking a stool softener (such as Colace) will usually help or prevent this problem from occurring.  A mild laxative (Milk of Magnesia or Miralax) should be taken according to package directions if there are no bowel movements after 48 hours. 7. Unless discharge instructions indicate otherwise, you may remove your bandages 72 hours after surgery, and you may shower at that time.  You may have steri-strips (small skin tapes) in place directly over the incision.  These strips should be left on the skin.  If your surgeon used skin glue on the incision, you may shower in 24 hours.  The glue will flake off over the next 2-3 weeks.  Any sutures or staples will be removed at the office during your follow-up visit. 8. ACTIVITIES:  You may  resume regular (light) daily activities beginning the next day--such as daily self-care, walking, climbing stairs--gradually increasing activities as tolerated.  You may have sexual intercourse when it is comfortable.  Refrain from any heavy lifting or straining-nothing over 10 pounds for 6 weeks. a. You may drive when you are no longer taking prescription pain medication, you can comfortably wear a seatbelt, and you can safely maneuver your car and apply brakes. b. RETURN TO WORK:  When released by MD__________________________________________________________ 9. You should see your doctor in the office for a follow-up appointment approximately 2-3 weeks after your surgery.  Make sure that you call for this appointment within a day or two after you arrive home to insure a convenient appointment time. 10. OTHER INSTRUCTIONS:  __Do not use Tylenol.___Wear binder.  Decrease alcohol intake._____________________________________________________________________________________________________________________________________________________________________________________  WHEN TO CALL YOUR DOCTOR: 1. Fever over 101.0 2. Inability to urinate 3. Nausea and/or vomiting 4. Extreme swelling or bruising 5. Continued bleeding from incision. 6. Increased pain, redness, or drainage from the incision  The clinic staff is available to answer your questions during regular business hours.  Please dont hesitate to call and ask to speak to one of the nurses for clinical concerns.  If you have a medical emergency, go to the nearest emergency room or call 911.  A surgeon from [facility] is always on call at the hospital   [address] [place], [place], [place]  [address] ?  [address], [place], [place]   [address] [phone])  [phone] ? [phone] ? FAX [phone] °Web site: [email or web address] °

## 2016-02-02 NOTE — Anesthesia Preprocedure Evaluation (Addendum)
Anesthesia Evaluation  Patient identified by MRN, date of birth, ID band Patient awake    Reviewed: Allergy & Precautions, NPO status , Patient's Chart, lab work & pertinent test results  Airway Mallampati: I  TM Distance: >3 FB Neck ROM: Full    Dental  (+) Teeth Intact, Dental Advisory Given   Pulmonary neg pulmonary ROS,    breath sounds clear to auscultation       Cardiovascular negative cardio ROS   Rhythm:Regular Rate:Normal     Neuro/Psych PSYCHIATRIC DISORDERS Anxiety Depression negative neurological ROS     GI/Hepatic negative GI ROS, Neg liver ROS,   Endo/Other  negative endocrine ROS  Renal/GU negative Renal ROS  negative genitourinary   Musculoskeletal negative musculoskeletal ROS (+)   Abdominal   Peds negative pediatric ROS (+)  Hematology negative hematology ROS (+)   Anesthesia Other Findings   Reproductive/Obstetrics negative OB ROS                            Lab Results  Component Value Date   WBC 2.5 (L) 01/27/2016   HGB 15.8 01/27/2016   HCT 46.2 01/27/2016   MCV 98.1 01/27/2016   PLT 138 (L) 01/27/2016   Lab Results  Component Value Date   CREATININE 0.60 (L) 01/27/2016   BUN 10 01/27/2016   NA 134 (L) 01/27/2016   K 4.2 01/27/2016   CL 99 (L) 01/27/2016   CO2 24 01/27/2016   No results found for: INR, PROTIME  07/2015 EKG: normal sinus rhythm.   Anesthesia Physical Anesthesia Plan  ASA: II  Anesthesia Plan: General   Post-op Pain Management:    Induction: Intravenous and Rapid sequence  Airway Management Planned: Oral ETT  Additional Equipment:   Intra-op Plan:   Post-operative Plan: Extubation in OR  Informed Consent: I have reviewed the patients History and Physical, chart, labs and discussed the procedure including the risks, benefits and alternatives for the proposed anesthesia with the patient or authorized representative who has  indicated his/her understanding and acceptance.   Dental advisory given  Plan Discussed with: CRNA  Anesthesia Plan Comments:        Anesthesia Quick Evaluation

## 2016-02-02 NOTE — Anesthesia Postprocedure Evaluation (Signed)
Anesthesia Post Note  Patient: [name] Brown  Procedure(s) Performed: Procedure(s) (LRB): LAPAROSCOPIC INCISIONAL HERNIA (N/A) INSERTION OF MESH (N/A)  Patient location during evaluation: PACU Anesthesia Type: General Level of consciousness: awake and alert Pain management: pain level controlled Vital Signs Assessment: post-procedure vital signs reviewed and stable Respiratory status: spontaneous breathing, nonlabored ventilation, respiratory function stable and patient connected to nasal cannula oxygen Cardiovascular status: blood pressure returned to baseline and stable Postop Assessment: no signs of nausea or vomiting Anesthetic complications: no    Last Vitals:  Vitals:   02/02/16 1400 02/02/16 1406  BP: (!) 132/97 137/89  Pulse: 86 87  Resp: 20 19  Temp: 36.9 C 36.9 C    Last Pain:  Vitals:   02/02/16 1406  TempSrc:   PainSc: Asleep                 Effie Berkshire

## 2016-02-03 DIAGNOSIS — K432 Incisional hernia without obstruction or gangrene: Secondary | ICD-10-CM | POA: Diagnosis not present

## 2016-02-03 MED ORDER — METHOCARBAMOL 500 MG PO TABS: 500.0000 mg | ORAL_TABLET | Freq: Three times a day (TID) | ORAL | 0 refills | Status: DC

## 2016-02-03 MED ORDER — METHOCARBAMOL 500 MG PO TABS: 500.0000 mg | ORAL_TABLET | Freq: Three times a day (TID) | ORAL | Status: DC

## 2016-02-03 MED ORDER — OXYCODONE HCL 5 MG PO TABS: 5.0000 mg | ORAL_TABLET | ORAL | 0 refills | Status: DC | PRN

## 2016-02-03 NOTE — Progress Notes (Signed)
1 Day Post-Op  Subjective: Pain well controlled.  Voiding. Walking.  Has a lift chair at home.  We discussed findings of cirrhosis.  Objective: Vital signs in last 24 hours: Temp:  [97.8 F (36.6 C)-98.8 F (37.1 C)] 97.8 F (36.6 C) (08/16 0554) Pulse Rate:  [69-108] 69 (08/16 0554) Resp:  [16-23] 18 (08/16 0554) BP: (119-163)/(81-116) 138/85 (08/16 0554) SpO2:  [94 %-100 %] 98 % (08/16 0554) Weight:  [81.6 kg (180 lb)] 81.6 kg (180 lb) (08/15 0957) Last BM Date: 02/01/16  Intake/Output from previous day: 08/15 0701 - 08/16 0700 In: 2306.3 [I.V.:2306.3] Out: 1775 [Urine:1775] Intake/Output this shift: No intake/output data recorded.  PE: General- In NAD Abdomen-soft, dressings dry  Lab Results:  No results for input(s): WBC, HGB, HCT, PLT in the last 72 hours. BMET No results for input(s): NA, K, CL, CO2, GLUCOSE, BUN, CREATININE, CALCIUM in the last 72 hours. PT/INR No results for input(s): LABPROT, INR in the last 72 hours. Comprehensive Metabolic Panel:    Component Value Date/Time   NA 134 (L) 01/27/2016 0840   NA 138 08/06/2015 0516   K 4.2 01/27/2016 0840   K 3.6 08/06/2015 0516   CL 99 (L) 01/27/2016 0840   CL 105 08/06/2015 0516   CO2 24 01/27/2016 0840   CO2 25 08/06/2015 0516   BUN 10 01/27/2016 0840   BUN <5 (L) 08/06/2015 0516   CREATININE 0.60 (L) 01/27/2016 0840   CREATININE 0.69 08/06/2015 0516   CREATININE 0.54 (L) 03/26/2015 1114   CREATININE 0.69 01/06/2015 1439   GLUCOSE 109 (H) 01/27/2016 0840   GLUCOSE 109 (H) 08/06/2015 0516   CALCIUM 9.1 01/27/2016 0840   CALCIUM 8.4 (L) 08/06/2015 0516   AST 146 (H) 01/27/2016 0840   AST 56 (H) 07/28/2015 1500   ALT 83 (H) 01/27/2016 0840   ALT 46 07/28/2015 1500   ALKPHOS 76 01/27/2016 0840   ALKPHOS 59 07/28/2015 1500   BILITOT 1.2 01/27/2016 0840   BILITOT 0.9 07/28/2015 1500   PROT 8.2 (H) 01/27/2016 0840   PROT 8.3 (H) 07/28/2015 1500   ALBUMIN 4.4 01/27/2016 0840   ALBUMIN 4.6 07/28/2015  1500     Studies/Results: No results found.  Anti-infectives: Anti-infectives    Start     Dose/Rate Route Frequency Ordered Stop   02/02/16 2000  ceFAZolin (ANCEF) IVPB 2g/100 mL premix     2 g 200 mL/hr over 30 Minutes Intravenous Every 8 hours 02/02/16 1318 02/02/16 2101   02/02/16 0940  ceFAZolin (ANCEF) IVPB 2g/100 mL premix     2 g 200 mL/hr over 30 Minutes Intravenous On call to O.R. 02/02/16 0940 02/02/16 1155      Assessment Principal Problem:   Incisional hernia s/p laparoscopic repair with mesh 02/02/16 -doing well   Alcoholism and cirrhosis-we had a long discussion about cessation   LOS: 0 days   Plan: Discharge today.  Instructions given.  Will discuss outpatient referral to GI when he comes back for follow up appointment.   Haeleigh Streiff J 02/03/2016

## 2016-02-03 NOTE — Progress Notes (Signed)
Discharge instructions discussed with patient, verbalized understanding and agreement.  Prescriptions given to patient

## 2016-02-17 NOTE — Discharge Summary (Signed)
Physician Discharge Summary  Patient ID: [name] Brown MRN: [id number] DOB/AGE: 08/07/1965 50 y.o.  Admit date: 02/02/2016 Discharge date: 02/03/2016  Admission Diagnoses:  Incisional hernia  Discharge Diagnoses:  Principal Problem:   Incisional hernia s/p laparoscopic repair with mesh 02/02/16   Discharged Condition: good  Hospital Course: He underwent the above procedure and did well. He was able to be discharged on POD #1.  Discharge instructions were given to him.  Discharge Exam: Blood pressure 138/85, pulse 69, temperature 97.8 F (36.6 C), temperature source Oral, resp. rate 18, height 5\' 7"  (1.702 m), weight 81.6 kg (180 lb), SpO2 98 %.   Disposition: 01-Home or Self Care     Medication List    TAKE these medications   allopurinol 300 MG tablet Commonly known as:  ZYLOPRIM TAKE 1 TABLET BY MOUTH EVERY DAY   busPIRone 15 MG tablet Commonly known as:  BUSPAR Take 1 tablet (15 mg total) by mouth 2 (two) times daily.   finasteride 5 MG tablet Commonly known as:  PROSCAR Take 5 mg by mouth every evening.   methocarbamol 500 MG tablet Commonly known as:  ROBAXIN Take 1 tablet (500 mg total) by mouth 3 (three) times daily.   multivitamin with minerals Tabs tablet Take 1 tablet by mouth daily.   oxyCODONE 5 MG immediate release tablet Commonly known as:  Oxy IR/ROXICODONE Take 1-2 tablets (5-10 mg total) by mouth every 4 (four) hours as needed for moderate pain.   zolpidem 5 MG tablet Commonly known as:  AMBIEN Take 0.5-1 tablets (2.5-5 mg total) by mouth at bedtime as needed for sleep.        Signed: Odis Hollingshead 02/17/2016, 11:01 AM

## 2016-05-27 ENCOUNTER — Other Ambulatory Visit: Payer: Self-pay | Admitting: *Deleted

## 2016-05-27 MED ORDER — ALLOPURINOL 300 MG PO TABS: 300.0000 mg | ORAL_TABLET | Freq: Every day | ORAL | 0 refills | Status: DC

## 2016-06-02 ENCOUNTER — Ambulatory Visit (INDEPENDENT_AMBULATORY_CARE_PROVIDER_SITE_OTHER): Payer: 59 | Admitting: Osteopathic Medicine

## 2016-06-02 ENCOUNTER — Encounter: Payer: Self-pay | Admitting: Osteopathic Medicine

## 2016-06-02 VITALS — BP 125/80 | HR 82 | Ht 67.0 in | Wt 192.0 lb

## 2016-06-02 DIAGNOSIS — F411 Generalized anxiety disorder: Secondary | ICD-10-CM

## 2016-06-02 DIAGNOSIS — R6889 Other general symptoms and signs: Secondary | ICD-10-CM

## 2016-06-02 DIAGNOSIS — Z23 Encounter for immunization: Secondary | ICD-10-CM | POA: Diagnosis not present

## 2016-06-02 DIAGNOSIS — R7401 Elevation of levels of liver transaminase levels: Secondary | ICD-10-CM

## 2016-06-02 DIAGNOSIS — K7030 Alcoholic cirrhosis of liver without ascites: Secondary | ICD-10-CM

## 2016-06-02 DIAGNOSIS — F1021 Alcohol dependence, in remission: Secondary | ICD-10-CM

## 2016-06-02 DIAGNOSIS — D497 Neoplasm of unspecified behavior of endocrine glands and other parts of nervous system: Secondary | ICD-10-CM

## 2016-06-02 DIAGNOSIS — F1020 Alcohol dependence, uncomplicated: Secondary | ICD-10-CM

## 2016-06-02 DIAGNOSIS — I10 Essential (primary) hypertension: Secondary | ICD-10-CM

## 2016-06-02 DIAGNOSIS — R740 Nonspecific elevation of levels of transaminase and lactic acid dehydrogenase [LDH]: Secondary | ICD-10-CM

## 2016-06-02 DIAGNOSIS — G4700 Insomnia, unspecified: Secondary | ICD-10-CM

## 2016-06-02 DIAGNOSIS — K7460 Unspecified cirrhosis of liver: Secondary | ICD-10-CM | POA: Insufficient documentation

## 2016-06-02 DIAGNOSIS — M109 Gout, unspecified: Secondary | ICD-10-CM

## 2016-06-02 MED ORDER — ZOLPIDEM TARTRATE 5 MG PO TABS: 2.5000 mg | ORAL_TABLET | Freq: Every evening | ORAL | 3 refills | Status: DC | PRN

## 2016-06-02 MED ORDER — BUSPIRONE HCL 15 MG PO TABS: 15.0000 mg | ORAL_TABLET | Freq: Two times a day (BID) | ORAL | 3 refills | Status: DC

## 2016-06-02 MED ORDER — ALLOPURINOL 300 MG PO TABS: 300.0000 mg | ORAL_TABLET | Freq: Every day | ORAL | 0 refills | Status: DC

## 2016-06-02 MED ORDER — FINASTERIDE 5 MG PO TABS: 5.0000 mg | ORAL_TABLET | Freq: Every evening | ORAL | 3 refills | Status: AC

## 2016-06-02 NOTE — Progress Notes (Signed)
HPI: [name] Brown is a 50 y.o. male  who presents to [facility] today, 06/02/16,  for chief complaint of:  Chief Complaint  Patient presents with  . Establish Care    New patient here to establish care from previous PCP at this practice. No significant complaints today. Most of the visit was spent in record review, monitoring of chronic medical conditions, refill of medications.  Alcoholism: Patient has a history of significant alcohol abuse, has been sober for the past 20 months or so but states intermittently will definitely crave alcohol particularly when trying to sleep.   Insomnia: Has been able to get by with intermittent use of Ambien. Previous sleep aids attempted without relief/with side effects. Previous medications reviewed include trazodone, Seroquel, doxepin.  Gastrointestinal: History of cirrhosis which patient states was discovered on a surgery for incidental adrenal mass. He has been following with GI as well. There sounds like there was initially some confusion of whether or not he had hepatitis C, he would like to be rechecked for this. I reviewed records, certainly has had transaminitis but I don't see hep C serologies. No available records at this time from GI. History of colon polyps. States that he typically would rather not think about liver disease except as motivation to not drink. His go friend is very supportive however she states that she tends to talk about his condition a lot, is active in support groups and online forearms, sometimes he would rather just not talk about it.  Anxiety: Contributes to insomnia. There is previous medications have been tried without much relief of symptoms. Patient states that he has done fairly well on buspirone 15 mg twice a day  Gout: Patient quit refill of allopurinol, would like uric acid levels checked.    Past medical, surgical, social and family history reviewed: Patient Active Problem  List   Diagnosis Date Noted  . Incisional hernia s/p laparoscopic repair with mesh 02/02/16 02/02/2016  . Incisional hernia, without obstruction or gangrene 01/04/2016  . Adrenal mass, right ([place]) 08/05/2015  . Adrenal tumor 05/25/2015  . History of colonic polyps 05/05/2015  . Hematuria 03/26/2015  . Prostatitis 03/26/2015  . Anxiety state 02/03/2015  . Essential hypertension 05/27/2014  . Elevated LFTs 05/27/2014  . Hyperlipidemia   . Uncomplicated alcohol dependence ([place]) 12/17/2013  . Substance induced mood disorder ([place]) 08/23/2013  . Alcohol dependence with withdrawal ([place]) 08/20/2013  . Thrombocytopenia ([place]) 08/14/2013  . Transaminitis 08/14/2013  . Total bilirubin, elevated 08/14/2013  . Alcohol withdrawal ([place]) 08/12/2013  . Depression 08/12/2013  . Gout 08/12/2013  . Alcohol abuse 08/08/2013   Past Surgical History:  Procedure Laterality Date  . EYE SURGERY  12-13 yrs ago   lasix eye surgery  . INCISIONAL HERNIA REPAIR N/A 02/02/2016   Procedure: LAPAROSCOPIC INCISIONAL HERNIA;  Surgeon: Jackolyn Confer, MD;  Location: [facility];  Service: General;  Laterality: N/A;  . INSERTION OF MESH N/A 02/02/2016   Procedure: INSERTION OF MESH;  Surgeon: Jackolyn Confer, MD;  Location: [facility];  Service: General;  Laterality: N/A;  . KNEE SURGERY Left 1989   arthroscopy  . nerve cautery     jaw bone   . ROBOTIC ADRENALECTOMY Right 08/05/2015   Procedure: XI ROBOTIC RIGHT ADRENALECTOMY ;  Surgeon: Alexis Frock, MD;  Location: [facility];  Service: Urology;  Laterality: Right;   Social History  Substance Use Topics  . Smoking status: Never Smoker  . Smokeless tobacco: Never Used  .  Alcohol use 45.0 oz/week    60 Cans of beer, 15 Shots of liquor per week     Comment: 6-7- drinks per day   Family History  Problem Relation Age of Onset  . Hyperlipidemia Father   . Heart failure Father   . Alcohol abuse Maternal Grandfather   . Colon cancer Neg Hx   . Stomach cancer Neg Hx   .  Rectal cancer Neg Hx   . Colon polyps Neg Hx      Current medication list and allergy/intolerance information reviewed:   Current Outpatient Prescriptions on File Prior to Visit  Medication Sig Dispense Refill  . allopurinol (ZYLOPRIM) 300 MG tablet Take 1 tablet (300 mg total) by mouth daily. 30 tablet 0  . busPIRone (BUSPAR) 15 MG tablet Take 1 tablet (15 mg total) by mouth 2 (two) times daily. 180 tablet 1  . finasteride (PROSCAR) 5 MG tablet Take 5 mg by mouth every evening.     . Multiple Vitamin (MULTIVITAMIN WITH MINERALS) TABS tablet Take 1 tablet by mouth daily.    Marland Kitchen zolpidem (AMBIEN) 5 MG tablet Take 0.5-1 tablets (2.5-5 mg total) by mouth at bedtime as needed for sleep. 30 tablet 0  . [DISCONTINUED] traZODone (DESYREL) 50 MG tablet Take 1 tablet (50 mg total) by mouth at bedtime as needed and may repeat dose one time if needed for sleep. 60 tablet 2   No current facility-administered medications on file prior to visit.    Allergies  Allergen Reactions  . Viibryd [Vilazodone Hcl]     Pt lost sense of taste       Review of Systems:  Constitutional: No recent illness  HEENT: No  headache, no vision change  Cardiac: No  chest pain, No  pressure, No palpitations  Respiratory:  No  shortness of breath. No  Cough  Gastrointestinal: No  abdominal pain  Skin: No  Rash  Hem/Onc: No  easy bruising/bleeding, No  abnormal lumps/bumps  Neurologic: No  weakness, No  Dizziness  Psychiatric: No  concerns with depression, No  concerns with anxiety  Exam:  BP 125/80   Pulse 82   Ht 5\' 7"  (1.702 m)   Wt 192 lb (87.1 kg)   BMI 30.07 kg/m   Constitutional: VS see above. General Appearance: alert, well-developed, well-nourished, NAD  Eyes: Normal lids and conjunctive, non-icteric sclera  Ears, Nose, Mouth, Throat: MMM, Normal external inspection ears/nares/mouth/lips/gums.  Neck: No masses, trachea midline.   Respiratory: Normal respiratory effort. no wheeze, no  rhonchi, no rales  Cardiovascular: S1/S2 normal, no murmur, no rub/gallop auscultated. RRR.   Musculoskeletal: Gait normal. Symmetric and independent movement of all extremities  Neurological: Normal balance/coordination. No tremor.  Skin: warm, dry, intact.   Psychiatric: Normal judgment/insight. Normal mood and affect. Oriented x3.    Records reviewed: Recent arthroscopic hernia repair, 02/02/2016. Op note mentions clinical diagnosis of cirrhosis. Robotic right adrenalectomy, 08/05/2015  Meds reviewed from Dr. Ileene Rubens, last visit 07/02/2015. Following up for hypertension, insomnia, anxiety. At that point blood pressure bit elevated, declined beta blocker for BP control.  ASSESSMENT/PLAN: Blood pressure improved on manual recheck. Routine labs and hep C for confirmation as noted below. Refills provided for patient. Offered increased buspirone to help anxiety, patient declines at this point. Consider psychiatry referral. Encouraged continued sobriety/alcohol abstinence. Patient has good insight into illness and importance of sobriety. He is involved in [place] and has good support from girlfriend and family.   Alcoholic cirrhosis of liver without ascites ([place]) -  Plan: COMPLETE METABOLIC PANEL WITH GFR, Lipid panel  History of alcoholism ([place]) - Plan: COMPLETE METABOLIC PANEL WITH GFR  Need for prophylactic vaccination and inoculation against influenza - Plan: Flu Vaccine QUAD 36+ mos IM  Anxiety state - Plan: busPIRone (BUSPAR) 15 MG tablet  Essential hypertension  Gout, unspecified cause, unspecified chronicity, unspecified site - Plan: allopurinol (ZYLOPRIM) 300 MG tablet, Uric acid  Insomnia, unspecified type - Plan: zolpidem (AMBIEN) 5 MG tablet  Cold intolerance - Plan: CBC with Differential/Platelet, TSH  Adrenal tumor  Uncomplicated alcohol dependence (HCC)  Transaminitis - Plan: COMPLETE METABOLIC PANEL WITH GFR, Hepatitis C antibody, reflex    Visit summary with  medication list and pertinent instructions was printed for patient to review. All questions at time of visit were answered - patient instructed to contact office with any additional concerns. ER/RTC precautions were reviewed with the patient. Follow-up plan: Return in about 6 months (around 12/01/2016) for [place], sooner if needed.  Note: Total time spent 40 minutes, greater than 50% of the visit was spent face-to-face counseling and coordinating care for the following: The primary encounter diagnosis was Alcoholic cirrhosis of liver without ascites ([place]). Diagnoses of History of alcoholism ([place]), Need for prophylactic vaccination and inoculation against influenza, Anxiety state, Essential hypertension, Gout, unspecified cause, unspecified chronicity, unspecified site, Insomnia, unspecified type, Cold intolerance, Adrenal tumor, Uncomplicated alcohol dependence ([place]), and Transaminitis were also pertinent to this visit.Marland Kitchen

## 2016-06-03 LAB — CBC WITH DIFFERENTIAL/PLATELET
BASOS ABS: 41 {cells}/uL (ref 0–200)
Basophils Relative: 1 %
EOS ABS: 123 {cells}/uL (ref 15–500)
EOS PCT: 3 %
HEMATOCRIT: 43.6 % (ref 38.5–50.0)
HEMOGLOBIN: 15 g/dL (ref 13.2–17.1)
LYMPHS ABS: 1517 {cells}/uL (ref 850–3900)
Lymphocytes Relative: 37 %
MCH: 33.8 pg — AB (ref 27.0–33.0)
MCHC: 34.4 g/dL (ref 32.0–36.0)
MCV: 98.2 fL (ref 80.0–100.0)
MPV: 10.6 fL (ref 7.5–12.5)
Monocytes Absolute: 574 cells/uL (ref 200–950)
Monocytes Relative: 14 %
NEUTROS PCT: 45 %
Neutro Abs: 1845 cells/uL (ref 1500–7800)
Platelets: 124 10*3/uL — ABNORMAL LOW (ref 140–400)
RBC: 4.44 MIL/uL (ref 4.20–5.80)
RDW: 13.9 % (ref 11.0–15.0)
WBC: 4.1 10*3/uL (ref 3.8–10.8)

## 2016-06-04 LAB — TSH: TSH: 0.96 mIU/L (ref 0.40–4.50)

## 2016-06-04 LAB — URIC ACID: Uric Acid, Serum: 5.8 mg/dL (ref 4.0–8.0)

## 2016-06-04 LAB — COMPLETE METABOLIC PANEL WITH GFR
ALT: 45 U/L (ref 9–46)
AST: 61 U/L — ABNORMAL HIGH (ref 10–35)
Albumin: 4.1 g/dL (ref 3.6–5.1)
Alkaline Phosphatase: 79 U/L (ref 40–115)
BUN: 13 mg/dL (ref 7–25)
CO2: 26 mmol/L (ref 20–31)
Calcium: 9 mg/dL (ref 8.6–10.3)
Chloride: 101 mmol/L (ref 98–110)
Creat: 0.64 mg/dL — ABNORMAL LOW (ref 0.70–1.33)
GFR, Est African American: >89 mL/min (ref >=60)
GFR, Est Non African American: >89 mL/min (ref >=60)
Glucose, Bld: 89 mg/dL (ref 65–99)
Potassium: 4 mmol/L (ref 3.5–5.3)
Sodium: 138 mmol/L (ref 135–146)
Total Bilirubin: 0.6 mg/dL (ref 0.2–1.2)
Total Protein: 7.6 g/dL (ref 6.1–8.1)

## 2016-06-04 LAB — LIPID PANEL
Cholesterol: 212 mg/dL — ABNORMAL HIGH (ref <200)
HDL: 40 mg/dL — ABNORMAL LOW (ref >40)
Total CHOL/HDL Ratio: 5.3 Ratio — ABNORMAL HIGH (ref <5.0)
Triglycerides: 487 mg/dL — ABNORMAL HIGH (ref <150)

## 2016-06-04 LAB — HEPATITIS C ANTIBODY: HCV Ab: NEGATIVE

## 2016-08-25 ENCOUNTER — Other Ambulatory Visit: Payer: Self-pay | Admitting: Osteopathic Medicine

## 2016-08-25 DIAGNOSIS — M109 Gout, unspecified: Secondary | ICD-10-CM

## 2016-10-26 ENCOUNTER — Other Ambulatory Visit: Payer: Self-pay | Admitting: Osteopathic Medicine

## 2016-10-26 DIAGNOSIS — M109 Gout, unspecified: Secondary | ICD-10-CM

## 2016-10-28 ENCOUNTER — Other Ambulatory Visit: Payer: Self-pay

## 2016-10-28 ENCOUNTER — Telehealth: Payer: Self-pay

## 2016-10-28 DIAGNOSIS — Z0000 Encounter for general adult medical examination without abnormal findings: Secondary | ICD-10-CM

## 2016-10-28 DIAGNOSIS — R748 Abnormal levels of other serum enzymes: Secondary | ICD-10-CM

## 2016-10-28 DIAGNOSIS — M109 Gout, unspecified: Secondary | ICD-10-CM

## 2016-10-28 DIAGNOSIS — E785 Hyperlipidemia, unspecified: Secondary | ICD-10-CM

## 2016-10-28 MED ORDER — PODOFILOX 0.5 % EX SOLN: CUTANEOUS | 2 refills | Status: DC

## 2016-10-28 MED ORDER — ALLOPURINOL 300 MG PO TABS: ORAL_TABLET | ORAL | 3 refills | Status: DC

## 2016-10-28 NOTE — Telephone Encounter (Signed)
Patient requested his labs prior to his visit. He would like to get his cholesterol rechecked and labs for gout. Please advise labs have to go through [facility]. Patient will be notified to pick up lab order once they are places. Please advise. Rhonda Cunningham,CMA

## 2016-10-28 NOTE — Telephone Encounter (Signed)
Patient request refill for Allopurinol 300 mg. #30 3 refills. Rhonda Cunningham,CMA

## 2016-11-01 NOTE — Addendum Note (Signed)
Addended by: Maryla Morrow on: 11/01/2016 09:37 AM   Modules accepted: Orders

## 2016-11-01 NOTE — Telephone Encounter (Signed)
Orders printed, can leave these up front and let pt know thery're ready

## 2016-11-01 NOTE — Telephone Encounter (Signed)
Left detailed message on patient vm advising that labs have been ordered and placed up front for him to pick up. Rhonda Cunningham,CMA

## 2016-11-11 LAB — CMP AND LIVER
ALBUMIN: 4.8 g/dL (ref 3.5–5.5)
ALK PHOS: 68 IU/L (ref 39–117)
ALT: 59 IU/L — ABNORMAL HIGH (ref 0–44)
AST: 76 IU/L — ABNORMAL HIGH (ref 0–40)
BUN: 10 mg/dL (ref 6–24)
Bilirubin Total: 0.7 mg/dL (ref 0.0–1.2)
Bilirubin, Direct: 0.25 mg/dL (ref 0.00–0.40)
CALCIUM: 9.2 mg/dL (ref 8.7–10.2)
CO2: 18 mmol/L (ref 18–29)
CREATININE: 0.74 mg/dL — AB (ref 0.76–1.27)
Chloride: 102 mmol/L (ref 96–106)
GFR, EST AFRICAN AMERICAN: 123 mL/min/{1.73_m2} (ref >59)
GFR, EST NON AFRICAN AMERICAN: 106 mL/min/{1.73_m2} (ref >59)
Glucose: 109 mg/dL — ABNORMAL HIGH (ref 65–99)
POTASSIUM: 4 mmol/L (ref 3.5–5.2)
SODIUM: 140 mmol/L (ref 134–144)
TOTAL PROTEIN: 8.3 g/dL (ref 6.0–8.5)

## 2016-11-11 LAB — CBC WITH DIFFERENTIAL/PLATELET
BASOS: 1 %
Basophils Absolute: 0 10*3/uL (ref 0.0–0.2)
EOS (ABSOLUTE): 0.1 10*3/uL (ref 0.0–0.4)
EOS: 2 %
HEMATOCRIT: 47.5 % (ref 37.5–51.0)
Hemoglobin: 16.5 g/dL (ref 13.0–17.7)
IMMATURE GRANS (ABS): 0 10*3/uL (ref 0.0–0.1)
Immature Granulocytes: 0 %
LYMPHS ABS: 1 10*3/uL (ref 0.7–3.1)
LYMPHS: 36 %
MCH: 34.4 pg — AB (ref 26.6–33.0)
MCHC: 34.7 g/dL (ref 31.5–35.7)
MCV: 99 fL — ABNORMAL HIGH (ref 79–97)
Monocytes Absolute: 0.4 10*3/uL (ref 0.1–0.9)
Monocytes: 14 %
NEUTROS ABS: 1.3 10*3/uL — AB (ref 1.4–7.0)
Neutrophils: 47 %
Platelets: 165 10*3/uL (ref 150–379)
RBC: 4.8 x10E6/uL (ref 4.14–5.80)
RDW: 13.5 % (ref 12.3–15.4)
WBC: 2.9 10*3/uL — ABNORMAL LOW (ref 3.4–10.8)

## 2016-11-11 LAB — VITAMIN D 25 HYDROXY (VIT D DEFICIENCY, FRACTURES): VIT D 25 HYDROXY: 32.6 ng/mL (ref 30.0–100.0)

## 2016-11-11 LAB — LIPID PANEL
CHOL/HDL RATIO: 4.1 ratio (ref 0.0–5.0)
Cholesterol, Total: 192 mg/dL (ref 100–199)
HDL: 47 mg/dL (ref >39)
LDL CALC: 82 mg/dL (ref 0–99)
TRIGLYCERIDES: 315 mg/dL — AB (ref 0–149)
VLDL Cholesterol Cal: 63 mg/dL — ABNORMAL HIGH (ref 5–40)

## 2016-11-11 LAB — URIC ACID: URIC ACID: 6.3 mg/dL (ref 3.7–8.6)

## 2016-11-16 ENCOUNTER — Ambulatory Visit (INDEPENDENT_AMBULATORY_CARE_PROVIDER_SITE_OTHER): Payer: 59 | Admitting: Osteopathic Medicine

## 2016-11-16 ENCOUNTER — Encounter: Payer: Self-pay | Admitting: Osteopathic Medicine

## 2016-11-16 VITALS — BP 134/88 | HR 84 | Ht 67.0 in | Wt 188.0 lb

## 2016-11-16 DIAGNOSIS — R7301 Impaired fasting glucose: Secondary | ICD-10-CM

## 2016-11-16 DIAGNOSIS — Z0000 Encounter for general adult medical examination without abnormal findings: Secondary | ICD-10-CM | POA: Diagnosis not present

## 2016-11-16 DIAGNOSIS — M109 Gout, unspecified: Secondary | ICD-10-CM | POA: Diagnosis not present

## 2016-11-16 DIAGNOSIS — F1010 Alcohol abuse, uncomplicated: Secondary | ICD-10-CM | POA: Diagnosis not present

## 2016-11-16 DIAGNOSIS — E785 Hyperlipidemia, unspecified: Secondary | ICD-10-CM

## 2016-11-16 DIAGNOSIS — A630 Anogenital (venereal) warts: Secondary | ICD-10-CM

## 2016-11-16 DIAGNOSIS — K7030 Alcoholic cirrhosis of liver without ascites: Secondary | ICD-10-CM | POA: Diagnosis not present

## 2016-11-16 DIAGNOSIS — D72818 Other decreased white blood cell count: Secondary | ICD-10-CM

## 2016-11-16 MED ORDER — INDOMETHACIN 25 MG PO CAPS: 50.0000 mg | ORAL_CAPSULE | Freq: Three times a day (TID) | ORAL | 1 refills | Status: DC | PRN

## 2016-11-16 NOTE — Patient Instructions (Addendum)
Plan:  Repeat labs next week to recheck white blood cells and get sugar test to confirm if prediabetic/diabetic - labs do not have to be fasting. Results will determine further visits/workup  Call urologist to see about possible procedure

## 2016-11-16 NOTE — Progress Notes (Signed)
HPI: [name] Brown is a 51 y.o. male  who presents to [facility] today, 11/16/16,  for chief complaint of:  Chief Complaint  Patient presents with  . Annual Exam   Patient initially scheduled for annual exam but he has a few medical concerns he would like to discuss in detail.  Went over lab results in detail with the patient: Following abnormalities include.  White blood cell count: Has been a bit up and down in the past, as well as low platelets. Patient denies recent illness.  Fasting glucose elevated, up to 109. Needs further testing for diabetes/prediabetes  Elevated liver enzymes in patient with known cirrhosis and continued alcohol use. He is not drinking as heavily as he used to but he still drinks beer fairly frequently. Is following with GI. No abdominal pain or swelling.  Cholesterol: Levels improved overall but triglyceride levels are still high.  History of gout: Uric acid levels are not at goal. Patient is on maximum dose of allopurinol, of note he is still drinking beer.  Additional concerns include.  History of genital warts. Previously on podofilox which was helpful, has a few new lesions at base of penile shaft which she is concerned maybe enlargement of hair follicles versus early growths. Has some questions about transmission of disease, whether this is possible that he can transmitted to his sexual partner.    Past medical, surgical, social and family history reviewed: Patient Active Problem List   Diagnosis Date Noted  . Hepatic cirrhosis ([place]) 06/02/2016  . Cold intolerance 06/02/2016  . Insomnia 06/02/2016  . Incisional hernia s/p laparoscopic repair with mesh 02/02/16 02/02/2016  . Incisional hernia, without obstruction or gangrene 01/04/2016  . Adrenal mass, right ([place]) 08/05/2015  . Adrenal tumor 05/25/2015  . History of colonic polyps 05/05/2015  . Hematuria 03/26/2015  . Prostatitis 03/26/2015  . Anxiety state  02/03/2015  . Essential hypertension 05/27/2014  . Elevated LFTs 05/27/2014  . Hyperlipidemia   . Uncomplicated alcohol dependence ([place]) 12/17/2013  . Substance induced mood disorder ([place]) 08/23/2013  . Alcohol dependence with withdrawal ([place]) 08/20/2013  . Thrombocytopenia ([place]) 08/14/2013  . Transaminitis 08/14/2013  . Total bilirubin, elevated 08/14/2013  . Alcohol withdrawal ([place]) 08/12/2013  . Depression 08/12/2013  . Gout 08/12/2013  . Alcohol abuse 08/08/2013   Past Surgical History:  Procedure Laterality Date  . EYE SURGERY  12-13 yrs ago   lasix eye surgery  . INCISIONAL HERNIA REPAIR N/A 02/02/2016   Procedure: LAPAROSCOPIC INCISIONAL HERNIA;  Surgeon: Jackolyn Confer, MD;  [facility]: [facility];  Service: General;  Laterality: N/A;  . INSERTION OF MESH N/A 02/02/2016   Procedure: INSERTION OF MESH;  Surgeon: Jackolyn Confer, MD;  Location: [facility];  Service: General;  Laterality: N/A;  . KNEE SURGERY Left 1989   arthroscopy  . nerve cautery     jaw bone   . ROBOTIC ADRENALECTOMY Right 08/05/2015   Procedure: XI ROBOTIC RIGHT ADRENALECTOMY ;  Surgeon: Alexis Frock, MD;  Location: [facility];  Service: Urology;  Laterality: Right;   Social History  Substance Use Topics  . Smoking status: Never Smoker  . Smokeless tobacco: Never Used  . Alcohol use 45.0 oz/week    60 Cans of beer, 15 Shots of liquor per week     Comment: 6-7- drinks per day   Family History  Problem Relation Age of Onset  . Hyperlipidemia Father   . Heart failure Father   . Alcohol abuse Maternal Grandfather   .  Colon cancer Neg Hx   . Stomach cancer Neg Hx   . Rectal cancer Neg Hx   . Colon polyps Neg Hx      Current medication list and allergy/intolerance information reviewed:   Current Outpatient Prescriptions  Medication Sig Dispense Refill  . allopurinol (ZYLOPRIM) 300 MG tablet TAKE 1 TABLET(300 MG) BY MOUTH DAILY 30 tablet 3  . busPIRone (BUSPAR) 15 MG tablet Take 1 tablet (15 mg total) by  mouth 2 (two) times daily. 180 tablet 3  . finasteride (PROSCAR) 5 MG tablet Take 1 tablet (5 mg total) by mouth every evening. 90 tablet 3  . Multiple Vitamin (MULTIVITAMIN WITH MINERALS) TABS tablet Take 1 tablet by mouth daily.    . podofilox (CONDYLOX) 0.5 % external solution Apply twice a day to growths for three days then rest for four days. Repeat weekly until growths are gone. 3.5 mL 2  . zolpidem (AMBIEN) 5 MG tablet Take 0.5-1 tablets (2.5-5 mg total) by mouth at bedtime as needed for sleep. 30 tablet 3   No current facility-administered medications for this visit.    Allergies  Allergen Reactions  . Viibryd [Vilazodone Hcl]     Pt lost sense of taste       Review of Systems:  Constitutional:  No  fever, no chills, No recent illness,  Cardiac: No  chest pain, No  pressure  Respiratory:  No  shortness of breath. No  Cough  Gastrointestinal: No  abdominal pain, No  nausea, No  vomiting,  No  blood in stool, No  diarrhea, No  constipation   Musculoskeletal: No new myalgia/arthralgia  Skin: No  Rash, +other wounds/concerning lesions  Hem/Onc: No  easy bruising/bleeding  Neurologic: No  weakness, No  dizziness  Psychiatric: No  concerns with depression, No  concerns with anxiety, No sleep problems, No mood problems  Exam:  BP 134/88   Pulse 84   Ht 5\' 7"  (1.702 m)   Wt 188 lb (85.3 kg)   BMI 29.44 kg/m   Constitutional: VS see above. General Appearance: alert, well-developed, well-nourished, NAD  Eyes: Normal lids and conjunctive, non-icteric sclera  Ears, Nose, Mouth, Throat: MMM, Normal external inspection ears/nares/mouth/lips/gums.  Neck: No masses, trachea midline. No thyroid enlargement. No tenderness/mass appreciated. No lymphadenopathy  Respiratory: Normal respiratory effort. no wheeze, no rhonchi, no rales  Cardiovascular: S1/S2 normal, no murmur, no rub/gallop auscultated. RRR. No lower extremity edema.  Gastrointestinal: Nontender, no masses. No  hepatomegaly, no splenomegaly. No hernia appreciated. Bowel sounds normal. Rectal exam deferred.   Musculoskeletal: Gait normal. No clubbing/cyanosis of digits.   Neurological: Normal balance/coordination. No tremor.  Skin: warm, dry, intact. No rash/ulcer. No concerning nevi or subq nodules on limited exam. Genital warts at base of shaft of penis anteriorly  Psychiatric: Normal judgment/insight. Normal mood and affect. Oriented x3.   Recent Results (from the past 2160 hour(s))  Lipid panel     Status: Abnormal   Collection Time: 11/10/16  8:23 AM  Result Value Ref Range   Cholesterol, Total 192 100 - 199 mg/dL   Triglycerides 315 (H) 0 - 149 mg/dL   HDL 47 >39 mg/dL   VLDL Cholesterol Cal 63 (H) 5 - 40 mg/dL   LDL Calculated 82 0 - 99 mg/dL   Chol/HDL Ratio 4.1 0.0 - 5.0 ratio    Comment:  T. Chol/HDL Ratio                                             Men  Women                               1/2 Avg.Risk  3.4    3.3                                   Avg.Risk  5.0    4.4                                2X Avg.Risk  9.6    7.1                                3X Avg.Risk 23.4   11.0   CMP and Liver     Status: Abnormal   Collection Time: 11/10/16  8:23 AM  Result Value Ref Range   Glucose 109 (H) 65 - 99 mg/dL   BUN 10 6 - 24 mg/dL   Creatinine, Ser 0.74 (L) 0.76 - 1.27 mg/dL   GFR calc non Af Amer 106 >59 mL/min/1.73   GFR calc Af Amer 123 >59 mL/min/1.73   Sodium 140 134 - 144 mmol/L   Potassium 4.0 3.5 - 5.2 mmol/L   Chloride 102 96 - 106 mmol/L   CO2 18 18 - 29 mmol/L   Calcium 9.2 8.7 - 10.2 mg/dL   Total Protein 8.3 6.0 - 8.5 g/dL   Albumin 4.8 3.5 - 5.5 g/dL   Bilirubin Total 0.7 0.0 - 1.2 mg/dL   Bilirubin, Direct 0.25 0.00 - 0.40 mg/dL   Alkaline Phosphatase 68 39 - 117 IU/L   AST 76 (H) 0 - 40 IU/L   ALT 59 (H) 0 - 44 IU/L  Uric acid     Status: None   Collection Time: 11/10/16  8:23 AM  Result Value Ref Range   Uric Acid 6.3 3.7  - 8.6 mg/dL    Comment:            Therapeutic target for gout patients: <6.0  CBC with Differential/Platelet     Status: Abnormal   Collection Time: 11/10/16  8:23 AM  Result Value Ref Range   WBC 2.9 (L) 3.4 - 10.8 x10E3/uL   RBC 4.80 4.14 - 5.80 x10E6/uL   Hemoglobin 16.5 13.0 - 17.7 g/dL   Hematocrit 47.5 37.5 - 51.0 %   MCV 99 (H) 79 - 97 fL   MCH 34.4 (H) 26.6 - 33.0 pg   MCHC 34.7 31.5 - 35.7 g/dL   RDW 13.5 12.3 - 15.4 %   Platelets 165 150 - 379 x10E3/uL   Neutrophils 47 Not Estab. %   Lymphs 36 Not Estab. %   Monocytes 14 Not Estab. %   Eos 2 Not Estab. %   Basos 1 Not Estab. %   Neutrophils Absolute 1.3 (L) 1.4 - 7.0 x10E3/uL   Lymphocytes Absolute 1.0 0.7 - 3.1 x10E3/uL   Monocytes Absolute 0.4 0.1 - 0.9 x10E3/uL   EOS (ABSOLUTE) 0.1 0.0 - 0.4 x10E3/uL  Basophils Absolute 0.0 0.0 - 0.2 x10E3/uL   Immature Granulocytes 0 Not Estab. %   Immature Grans (Abs) 0.0 0.0 - 0.1 x10E3/uL  VITAMIN D 25 Hydroxy (Vit-D Deficiency, Fractures)     Status: None   Collection Time: 11/10/16  8:23 AM  Result Value Ref Range   Vit D, 25-Hydroxy 32.6 30.0 - 100.0 ng/mL    Comment: Vitamin D deficiency has been defined by the [place] practice guideline as a level of serum 25-OH vitamin D less than 20 ng/mL (1,2). The Endocrine Society went on to further define vitamin D insufficiency as a level between 21 and 29 ng/mL (2). 1. IOM (Institute of Medicine). 2010. Dietary reference    intakes for calcium and D. [place]: The    [employer]. 2. Holick MF, Binkley [place], Bischoff-Ferrari HA, et al.    Evaluation, treatment, and prevention of vitamin D    deficiency: an Endocrine Society clinical practice    guideline. JCEM. 2011 Jul; 96(7):1911-30.     ASSESSMENT/PLAN:   Gout, unspecified cause, unspecified chronicity, unspecified site - Advised to decrease alcohol intake, filled indomethacin to have on hand as needed for attack -  Plan: indomethacin (INDOCIN) 25 MG capsule  Elevated fasting glucose - Plan: Hemoglobin A1c, Hemoglobin A1c  Other decreased white blood cell (WBC) count - Has been up and down over the past few years, will get peripheral smear for completeness - Plan: CBC with Differential/Platelet, CBC with Differential/Platelet  Genital warts - Continue podofilox, advised he can transmit this via skin to skin contact and this is not an area that condom would cover  Hyperlipidemia, unspecified hyperlipidemia type  Alcohol abuse  Alcoholic cirrhosis of liver without ascites ([place])  Annual physical exam - Preventive care reviewed as below   [place]  updated 11/16/16  ANNUAL SCREENING/COUNSELING  Any changes to health in the past year? no  Diet/Exercise - HEALTHY HABITS DISCUSSED TO DECREASE CV RISK History  Smoking Status  . Never Smoker  Smokeless Tobacco  . Never Used   History  Alcohol Use  . 45.0 oz/week  . 60 Cans of beer, 15 Shots of liquor per week    Comment: 6-7- drinks per day  Needs to stop beer. GI is aware  Depression screen [facility] 2/9 11/16/2016  Decreased Interest 1  Down, Depressed, Hopeless 1  PHQ - 2 Score 2  Altered sleeping 0  Tired, decreased energy 0  Change in appetite 0  Feeling bad or failure about yourself  0  Trouble concentrating 0  Moving slowly or fidgety/restless 0  Suicidal thoughts 0  PHQ-9 Score 2   INFECTIOUS DISEASE SCREENING  HIV - needs  GC/CT - does not need  HepC - does not need  TB - does not need  CANCER SCREENING  Lung - does not need  Colon - does not need  OTHER DISEASE SCREENING  Lipid - does not need  DM2 - needs  AAA - 65-75yo ever smoked: does not need  Osteoporosis - men 52yo+ - does not need  ADULT VACCINATION  Influenza - annual vaccine recommended  Td - booster every 10 years   Zoster - option at 70, yes at 60+   PCV13 - was not indicated  PPSV23 - was not indicated Immunization History   Administered Date(s) Administered  . Influenza,inj,Quad PF,36+ Mos 06/05/2015, 06/02/2016  . Pneumococcal Polysaccharide-23 08/13/2013  . Tdap 01/06/2015      Patient Instructions  Plan:  Repeat labs next week to recheck white blood cells and get sugar test to confirm if prediabetic/diabetic - labs do not have to be fasting. Results will determine further visits/workup  Call urologist to see about possible procedure     Visit summary with medication list and pertinent instructions was printed for patient to review. All questions at time of visit were answered - patient instructed to contact office with any additional concerns. ER/RTC precautions were reviewed with the patient. Follow-up plan: Return depending on labs, if normal, come see me in 6 months to recheck liver tests and blood pressure.

## 2016-11-26 LAB — CBC WITH DIFFERENTIAL/PLATELET
BASOS ABS: 0 10*3/uL (ref 0.0–0.2)
BASOS: 1 %
EOS (ABSOLUTE): 0.1 10*3/uL (ref 0.0–0.4)
Eos: 2 %
Hematocrit: 45.1 % (ref 37.5–51.0)
Hemoglobin: 15.5 g/dL (ref 13.0–17.7)
IMMATURE GRANULOCYTES: 0 %
Immature Grans (Abs): 0 10*3/uL (ref 0.0–0.1)
Lymphocytes Absolute: 1.5 10*3/uL (ref 0.7–3.1)
Lymphs: 37 %
MCH: 34.1 pg — ABNORMAL HIGH (ref 26.6–33.0)
MCHC: 34.4 g/dL (ref 31.5–35.7)
MCV: 99 fL — AB (ref 79–97)
MONOS ABS: 0.4 10*3/uL (ref 0.1–0.9)
Monocytes: 10 %
NEUTROS PCT: 50 %
Neutrophils Absolute: 2 10*3/uL (ref 1.4–7.0)
PLATELETS: 137 10*3/uL — AB (ref 150–379)
RBC: 4.54 x10E6/uL (ref 4.14–5.80)
RDW: 12.9 % (ref 12.3–15.4)
WBC: 3.9 10*3/uL (ref 3.4–10.8)

## 2016-11-26 LAB — HEMOGLOBIN A1C
ESTIMATED AVERAGE GLUCOSE: 91 mg/dL
HEMOGLOBIN A1C: 4.8 % (ref 4.8–5.6)

## 2016-11-29 ENCOUNTER — Other Ambulatory Visit: Payer: Self-pay

## 2016-11-29 DIAGNOSIS — G4700 Insomnia, unspecified: Secondary | ICD-10-CM

## 2016-11-29 MED ORDER — ZOLPIDEM TARTRATE 5 MG PO TABS: 2.5000 mg | ORAL_TABLET | Freq: Every evening | ORAL | 3 refills | Status: DC | PRN

## 2016-12-02 ENCOUNTER — Telehealth: Payer: Self-pay | Admitting: Osteopathic Medicine

## 2016-12-02 NOTE — Telephone Encounter (Signed)
error 

## 2017-01-10 ENCOUNTER — Other Ambulatory Visit: Payer: Self-pay | Admitting: Osteopathic Medicine

## 2017-02-28 ENCOUNTER — Other Ambulatory Visit: Payer: Self-pay | Admitting: Osteopathic Medicine

## 2017-03-24 ENCOUNTER — Other Ambulatory Visit: Payer: Self-pay | Admitting: Osteopathic Medicine

## 2017-04-13 ENCOUNTER — Other Ambulatory Visit: Payer: Self-pay | Admitting: Osteopathic Medicine

## 2017-04-13 DIAGNOSIS — M109 Gout, unspecified: Secondary | ICD-10-CM

## 2017-05-08 ENCOUNTER — Other Ambulatory Visit: Payer: Self-pay | Admitting: Osteopathic Medicine

## 2017-05-08 ENCOUNTER — Telehealth: Payer: Self-pay | Admitting: Osteopathic Medicine

## 2017-05-08 DIAGNOSIS — R945 Abnormal results of liver function studies: Principal | ICD-10-CM

## 2017-05-08 DIAGNOSIS — R7989 Other specified abnormal findings of blood chemistry: Secondary | ICD-10-CM

## 2017-05-08 DIAGNOSIS — K7030 Alcoholic cirrhosis of liver without ascites: Secondary | ICD-10-CM

## 2017-05-08 DIAGNOSIS — I10 Essential (primary) hypertension: Secondary | ICD-10-CM

## 2017-05-08 DIAGNOSIS — M109 Gout, unspecified: Secondary | ICD-10-CM

## 2017-05-08 DIAGNOSIS — K7460 Unspecified cirrhosis of liver: Secondary | ICD-10-CM

## 2017-05-08 NOTE — Telephone Encounter (Signed)
What labs would you like patient to have drawn?

## 2017-05-08 NOTE — Telephone Encounter (Signed)
CBC, CMP, uric acid, lipid panel. Diagnoses: Hypertension, gout, hepatic cirrhosis

## 2017-05-08 NOTE — Telephone Encounter (Signed)
Patient called adv he left 2 messages on nurses line in the last 2 weeks of wanting to get a lab order to pick up to take to [place] has been getting labs check'd every 6 months and would like to have same lab order from May of this year. Pt req a call once labs are entered so he can come pick up the order. Thanks

## 2017-05-09 NOTE — Telephone Encounter (Signed)
Labs ordered  Pt advised

## 2017-05-17 LAB — LIPID PANEL
CHOL/HDL RATIO: 14.5 (calc) — AB (ref <5.0)
CHOLESTEROL: 189 mg/dL (ref <200)
HDL: 13 mg/dL — AB (ref >40)
Non-HDL Cholesterol (Calc): 176 mg/dL (calc) — ABNORMAL HIGH (ref <130)
Triglycerides: 691 mg/dL — ABNORMAL HIGH (ref <150)

## 2017-05-17 LAB — CBC WITH DIFFERENTIAL/PLATELET
Basophils Absolute: 19 cells/uL (ref 0–200)
Basophils Relative: 0.8 %
Eosinophils Absolute: 38 cells/uL (ref 15–500)
Eosinophils Relative: 1.6 %
HEMATOCRIT: 45.5 % (ref 38.5–50.0)
HEMOGLOBIN: 15.8 g/dL (ref 13.2–17.1)
LYMPHS ABS: 790 {cells}/uL — AB (ref 850–3900)
MCH: 33.8 pg — ABNORMAL HIGH (ref 27.0–33.0)
MCHC: 34.7 g/dL (ref 32.0–36.0)
MCV: 97.2 fL (ref 80.0–100.0)
MPV: 10.5 fL (ref 7.5–12.5)
Monocytes Relative: 13.6 %
NEUTROS PCT: 51.1 %
Neutro Abs: 1226 cells/uL — ABNORMAL LOW (ref 1500–7800)
Platelets: 113 10*3/uL — ABNORMAL LOW (ref 140–400)
RBC: 4.68 10*6/uL (ref 4.20–5.80)
RDW: 12 % (ref 11.0–15.0)
Total Lymphocyte: 32.9 %
WBC: 2.4 10*3/uL — AB (ref 3.8–10.8)
WBCMIX: 326 {cells}/uL (ref 200–950)

## 2017-05-17 LAB — COMPLETE METABOLIC PANEL WITH GFR
AG RATIO: 1.1 (calc) (ref 1.0–2.5)
ALBUMIN MSPROF: 3.9 g/dL (ref 3.6–5.1)
ALT: 159 U/L — ABNORMAL HIGH (ref 9–46)
AST: 266 U/L — ABNORMAL HIGH (ref 10–35)
Alkaline phosphatase (APISO): 93 U/L (ref 40–115)
BUN: 8 mg/dL (ref 7–25)
CALCIUM: 8.9 mg/dL (ref 8.6–10.3)
CO2: 26 mmol/L (ref 20–32)
Chloride: 100 mmol/L (ref 98–110)
Creat: 0.75 mg/dL (ref 0.70–1.33)
GFR, EST NON AFRICAN AMERICAN: 105 mL/min/{1.73_m2} (ref >=60)
GFR, Est African American: 122 mL/min/{1.73_m2} (ref >=60)
GLOBULIN: 3.6 g/dL (ref 1.9–3.7)
Glucose, Bld: 105 mg/dL — ABNORMAL HIGH (ref 65–99)
Potassium: 3.8 mmol/L (ref 3.5–5.3)
SODIUM: 136 mmol/L (ref 135–146)
TOTAL PROTEIN: 7.5 g/dL (ref 6.1–8.1)
Total Bilirubin: 2.2 mg/dL — ABNORMAL HIGH (ref 0.2–1.2)

## 2017-05-17 LAB — URIC ACID: URIC ACID, SERUM: 5.8 mg/dL (ref 4.0–8.0)

## 2017-05-22 ENCOUNTER — Encounter: Payer: Self-pay | Admitting: Osteopathic Medicine

## 2017-05-22 ENCOUNTER — Ambulatory Visit (INDEPENDENT_AMBULATORY_CARE_PROVIDER_SITE_OTHER): Payer: 59 | Admitting: Osteopathic Medicine

## 2017-05-22 VITALS — BP 145/82 | HR 95

## 2017-05-22 DIAGNOSIS — F1010 Alcohol abuse, uncomplicated: Secondary | ICD-10-CM | POA: Diagnosis not present

## 2017-05-22 DIAGNOSIS — K7030 Alcoholic cirrhosis of liver without ascites: Secondary | ICD-10-CM | POA: Diagnosis not present

## 2017-05-22 NOTE — Patient Instructions (Signed)
Plan: Will see what GI specialist says (Dr Benson Norway) If need alternate referral, let me know

## 2017-05-22 NOTE — Progress Notes (Signed)
HPI: [name] Brown is a 51 y.o. male who  has a past medical history of Alcohol abuse, Allergy, Anxiety, Depression, Enlarged prostate, Gout, History of hematuria, Hyperlipidemia, Nocturia, and Right adrenal mass ([place]).  he presents to [facility] today, 05/22/17,  for chief complaint of:  Chief Complaint  Patient presents with  . Results    Liver enzymes markedly higher. Still drinking EtOH. Recently cancelled GI appt d/t concerns he might be discharged from the practice - Dr Benson Norway reportedly told him at some point that if he continued to drink there is not much more help that he can offer.   No RUQ pain. No cough or hemoptysis.    Past medical, surgical, social and family history reviewed:  Patient Active Problem List   Diagnosis Date Noted  . Hepatic cirrhosis ([place]) 06/02/2016  . Cold intolerance 06/02/2016  . Insomnia 06/02/2016  . Incisional hernia s/p laparoscopic repair with mesh 02/02/16 02/02/2016  . Incisional hernia, without obstruction or gangrene 01/04/2016  . Adrenal mass, right ([place]) 08/05/2015  . Adrenal tumor 05/25/2015  . History of colonic polyps 05/05/2015  . Hematuria 03/26/2015  . Prostatitis 03/26/2015  . Anxiety state 02/03/2015  . Essential hypertension 05/27/2014  . Elevated LFTs 05/27/2014  . Hyperlipidemia   . Uncomplicated alcohol dependence ([place]) 12/17/2013  . Substance induced mood disorder ([place]) 08/23/2013  . Alcohol dependence with withdrawal ([place]) 08/20/2013  . Thrombocytopenia ([place]) 08/14/2013  . Transaminitis 08/14/2013  . Total bilirubin, elevated 08/14/2013  . Alcohol withdrawal ([place]) 08/12/2013  . Depression 08/12/2013  . Gout 08/12/2013  . Alcohol abuse 08/08/2013    Past Surgical History:  Procedure Laterality Date  . EYE SURGERY  12-13 yrs ago   lasix eye surgery  . INCISIONAL HERNIA REPAIR N/A 02/02/2016   Procedure: LAPAROSCOPIC INCISIONAL HERNIA;  Surgeon: Jackolyn Confer, MD;  Location:  [facility];  Service: General;  Laterality: N/A;  . INSERTION OF MESH N/A 02/02/2016   Procedure: INSERTION OF MESH;  Surgeon: Jackolyn Confer, MD;  Location: [facility];  Service: General;  Laterality: N/A;  . KNEE SURGERY Left 1989   arthroscopy  . nerve cautery     jaw bone   . ROBOTIC ADRENALECTOMY Right 08/05/2015   Procedure: XI ROBOTIC RIGHT ADRENALECTOMY ;  Surgeon: Alexis Frock, MD;  Location: [facility];  Service: Urology;  Laterality: Right;    Social History   Tobacco Use  . Smoking status: Never Smoker  . Smokeless tobacco: Never Used  Substance Use Topics  . Alcohol use: Yes    Alcohol/week: 45.0 oz    Types: 60 Cans of beer, 15 Shots of liquor per week    Comment: 6-7- drinks per day    Family History  Problem Relation Age of Onset  . Hyperlipidemia Father   . Heart failure Father   . Alcohol abuse Maternal Grandfather   . Colon cancer Neg Hx   . Stomach cancer Neg Hx   . Rectal cancer Neg Hx   . Colon polyps Neg Hx      Current medication list and allergy/intolerance information reviewed:    Current Outpatient Medications  Medication Sig Dispense Refill  . allopurinol (ZYLOPRIM) 300 MG tablet TAKE 1 TABLET(300 MG) BY MOUTH DAILY 30 tablet 0  . busPIRone (BUSPAR) 15 MG tablet Take 1 tablet (15 mg total) by mouth 2 (two) times daily. 180 tablet 3  . finasteride (PROSCAR) 5 MG tablet Take 1 tablet (5 mg total) by mouth every evening.  90 tablet 3  . indomethacin (INDOCIN) 25 MG capsule Take 2 capsules (50 mg total) by mouth 3 (three) times daily as needed. For 1-3 days until gout flare subsides, then reduce dose to 1 capsule (25 mg total) twice daily as needed 30 capsule 1  . Multiple Vitamin (MULTIVITAMIN WITH MINERALS) TABS tablet Take 1 tablet by mouth daily.    . Omega-3 Fatty Acids (FISH OIL) 1200 MG CAPS Take 1,200 mg by mouth daily.    Marland Kitchen omeprazole (PRILOSEC) 10 MG capsule Take 10 mg by mouth daily.    . podofilox (CONDYLOX) 0.5 % external solution APPLY TOPICALLY  TWICE DAILY 3.5 mL 0  . zolpidem (AMBIEN) 5 MG tablet Take 0.5-1 tablets (2.5-5 mg total) by mouth at bedtime as needed for sleep. 30 tablet 3   No current facility-administered medications for this visit.     Allergies  Allergen Reactions  . Viibryd [Vilazodone Hcl]     Pt lost sense of taste       Review of Systems:  Constitutional:  No  fever, no chills  HEENT: No  headache, no vision change  Cardiac: No  chest pain, No  pressure, No palpitations  Respiratory:  No  shortness of breath. No  Cough  Gastrointestinal: No  abdominal pain, No  nausea, No  vomiting,  No  blood in stool, No  diarrhea, No  constipation   Hem/Onc: No  easy bruising/bleeding  Neurologic: No  weakness, No  dizziness  Psychiatric: No  concerns with depression, No  concerns with anxiety, No sleep problems, No mood problems  Exam:  BP (!) 145/82   Pulse 95   Constitutional: VS see above. General Appearance: alert, well-developed, well-nourished, NAD  Eyes: Normal lids and conjunctive, non-icteric sclera  Ears, Nose, Mouth, Throat: MMM, Normal external inspection ears/nares/mouth/lips/gums.  Neck: No masses, trachea midline. No thyroid enlargement.   Respiratory: Normal respiratory effort. no wheeze, no rhonchi, no rales  Cardiovascular: S1/S2 normal, no murmur, no rub/gallop auscultated. RRR. No lower extremity edema.   Gastrointestinal: Nontender, no masses. N  Musculoskeletal: Gait normal.   Neurological: Normal balance/coordination. No tremor.  Skin: warm, dry   Psychiatric: Normal judgment/insight. Normal mood and affect. Oriented x3.    Recent Results (from the past 2160 hour(s))  CBC with Differential     Status: Abnormal   Collection Time: 05/17/17  7:53 AM  Result Value Ref Range   WBC 2.4 (L) 3.8 - 10.8 Thousand/uL   RBC 4.68 4.20 - 5.80 Million/uL   Hemoglobin 15.8 13.2 - 17.1 g/dL   HCT 45.5 38.5 - 50.0 %   MCV 97.2 80.0 - 100.0 fL   MCH 33.8 (H) 27.0 - 33.0 pg    MCHC 34.7 32.0 - 36.0 g/dL   RDW 12.0 11.0 - 15.0 %   Platelets 113 (L) 140 - 400 Thousand/uL   MPV 10.5 7.5 - 12.5 fL   Neutro Abs 1,226 (L) 1,500 - 7,800 cells/uL   Lymphs Abs 790 (L) 850 - 3,900 cells/uL   WBC mixed population 326 200 - 950 cells/uL   Eosinophils Absolute 38 15 - 500 cells/uL   Basophils Absolute 19 0 - 200 cells/uL   Neutrophils Relative % 51.1 %   Total Lymphocyte 32.9 %   Monocytes Relative 13.6 %   Eosinophils Relative 1.6 %   Basophils Relative 0.8 %  COMPLETE METABOLIC PANEL WITH GFR     Status: Abnormal   Collection Time: 05/17/17  7:53 AM  Result Value Ref  Range   Glucose, Bld 105 (H) 65 - 99 mg/dL    Comment: .            Fasting reference interval . For someone without known diabetes, a glucose value between 100 and 125 mg/dL is consistent with prediabetes and should be confirmed with a follow-up test. .    BUN 8 7 - 25 mg/dL   Creat 0.75 0.70 - 1.33 mg/dL    Comment: For patients >51 years of age, the reference limit for Creatinine is approximately 13% higher for people identified as African-American. .    GFR, Est Non African American 105 > OR = 60 mL/min/1.86m2   GFR, Est African American 122 > OR = 60 mL/min/1.45m2   BUN/Creatinine Ratio NOT APPLICABLE 6 - 22 (calc)   Sodium 136 135 - 146 mmol/L   Potassium 3.8 3.5 - 5.3 mmol/L   Chloride 100 98 - 110 mmol/L   CO2 26 20 - 32 mmol/L   Calcium 8.9 8.6 - 10.3 mg/dL   Total Protein 7.5 6.1 - 8.1 g/dL   Albumin 3.9 3.6 - 5.1 g/dL   Globulin 3.6 1.9 - 3.7 g/dL (calc)   AG Ratio 1.1 1.0 - 2.5 (calc)   Total Bilirubin 2.2 (H) 0.2 - 1.2 mg/dL   Alkaline phosphatase (APISO) 93 40 - 115 U/L   AST 266 (H) 10 - 35 U/L   ALT 159 (H) 9 - 46 U/L  Uric acid     Status: None   Collection Time: 05/17/17  7:53 AM  Result Value Ref Range   Uric Acid, Serum 5.8 4.0 - 8.0 mg/dL    Comment: Therapeutic target for gout patients: <6.0 mg/dL .   Lipid Profile     Status: Abnormal   Collection Time:  05/17/17  7:53 AM  Result Value Ref Range   Cholesterol 189 <200 mg/dL   HDL 13 (L) >40 mg/dL   Triglycerides 691 (H) <150 mg/dL   LDL Cholesterol (Calc)  mg/dL (calc)    Comment: . LDL cholesterol not calculated. Triglyceride levels greater than 400 mg/dL invalidate calculated LDL results. . Reference range: <100 . Desirable range <100 mg/dL for primary prevention;   <70 mg/dL for patients with CHD or diabetic patients  with > or = 2 CHD risk factors. Marland Kitchen LDL-C is now calculated using the Martin-Hopkins  calculation, which is a validated novel method providing  better accuracy than the Friedewald equation in the  estimation of LDL-C.  Cresenciano Genre et al. Annamaria Helling. 6063;016(01): 2061-2068  (http://education.[employer].com/faq/FAQ164)    Total CHOL/HDL Ratio 14.5 (H) <5.0 (calc)   Non-HDL Cholesterol (Calc) 176 (H) <130 mg/dL (calc)    Comment: For patients with diabetes plus 1 major ASCVD risk  factor, treating to a non-HDL-C goal of <100 mg/dL  (LDL-C of <70 mg/dL) is considered a therapeutic  option.      ASSESSMENT/PLAN:   Alcoholic cirrhosis of liver without ascites ([place]) - advised f/u with GI for monitoring of worsening condition  - Plan: CBC, COMPLETE METABOLIC PANEL WITH GFR, TSH, Lipid panel  Alcohol abuse - discussed AA, rehab, psych. Pt declines interventio nat this point, He is aware that this issue may kill him if he can't stop. Will do what I can.     Patient Instructions  Plan: Will see what GI specialist says (Dr Benson Norway) If need alternate referral, let me know      Visit summary with medication list and pertinent instructions was printed for patient to review.  All questions at time of visit were answered - patient instructed to contact office with any additional concerns. ER/RTC precautions were reviewed with the patient. Follow-up plan: Return in about 1 month (around 06/22/2017) for repeat labs if needed (if unable to get in to see GI) .  Note: Total time  spent 25 minutes, greater than 50% of the visit was spent face-to-face counseling and coordinating care for the following: The primary encounter diagnosis was Alcoholic cirrhosis of liver without ascites ([place]). A diagnosis of Alcohol abuse was also pertinent to this visit.Marland Kitchen  Please note: voice recognition software was used to produce this document, and typos may escape review. Please contact Dr. Sheppard Coil for any needed clarifications.

## 2017-05-23 ENCOUNTER — Encounter: Payer: Self-pay | Admitting: Osteopathic Medicine

## 2017-05-24 ENCOUNTER — Ambulatory Visit: Payer: Self-pay

## 2017-05-25 ENCOUNTER — Ambulatory Visit (INDEPENDENT_AMBULATORY_CARE_PROVIDER_SITE_OTHER): Payer: 59 | Admitting: Family Medicine

## 2017-05-25 DIAGNOSIS — Z23 Encounter for immunization: Secondary | ICD-10-CM | POA: Diagnosis not present

## 2017-06-09 ENCOUNTER — Other Ambulatory Visit: Payer: Self-pay | Admitting: Osteopathic Medicine

## 2017-06-21 ENCOUNTER — Other Ambulatory Visit: Payer: Self-pay | Admitting: Osteopathic Medicine

## 2017-06-21 DIAGNOSIS — M109 Gout, unspecified: Secondary | ICD-10-CM

## 2017-06-30 ENCOUNTER — Other Ambulatory Visit: Payer: Self-pay | Admitting: Osteopathic Medicine

## 2017-06-30 DIAGNOSIS — F411 Generalized anxiety disorder: Secondary | ICD-10-CM

## 2017-08-02 ENCOUNTER — Other Ambulatory Visit: Payer: Self-pay | Admitting: Osteopathic Medicine

## 2017-08-02 DIAGNOSIS — G4700 Insomnia, unspecified: Secondary | ICD-10-CM

## 2017-08-02 NOTE — Telephone Encounter (Signed)
Pharmacy requesting refill for ambien medication.

## 2017-08-02 NOTE — Telephone Encounter (Signed)
Left a brief vm msg for pt regarding medication refill request.

## 2017-10-13 ENCOUNTER — Other Ambulatory Visit: Payer: Self-pay | Admitting: Osteopathic Medicine

## 2017-10-13 DIAGNOSIS — M109 Gout, unspecified: Secondary | ICD-10-CM

## 2017-10-13 DIAGNOSIS — F411 Generalized anxiety disorder: Secondary | ICD-10-CM

## 2017-10-27 ENCOUNTER — Telehealth: Payer: Self-pay

## 2017-10-27 DIAGNOSIS — K7460 Unspecified cirrhosis of liver: Secondary | ICD-10-CM

## 2017-10-27 DIAGNOSIS — I10 Essential (primary) hypertension: Secondary | ICD-10-CM

## 2017-10-27 NOTE — Telephone Encounter (Signed)
Pt called wanting Dr Sheppard Coil to go ahead and order his "six month routine blood-work". Pt states he will call and make an appt after he has had his blood drawn.  Dr Sheppard Coil, can you please order this upon your return to office?   Please route back when ordered. Pt wants a return call once ordered.   Thanks!

## 2017-11-01 ENCOUNTER — Telehealth: Payer: Self-pay | Admitting: Emergency Medicine

## 2017-11-01 NOTE — Telephone Encounter (Signed)
Patient requesting labs be ordered for this 6 month interval; after he has them done he will make appointment; please notify him when order is placed.

## 2017-11-01 NOTE — Telephone Encounter (Signed)
See other encounter.

## 2017-11-02 NOTE — Telephone Encounter (Signed)
Orders in he can come to the lab

## 2017-11-02 NOTE — Telephone Encounter (Signed)
Pt advised.

## 2017-11-08 LAB — LIPID PANEL
CHOLESTEROL: 190 mg/dL (ref <200)
HDL: 38 mg/dL — ABNORMAL LOW (ref >40)
LDL CHOLESTEROL (CALC): 126 mg/dL — AB
Non-HDL Cholesterol (Calc): 152 mg/dL (calc) — ABNORMAL HIGH (ref <130)
TRIGLYCERIDES: 145 mg/dL (ref <150)
Total CHOL/HDL Ratio: 5 (calc) — ABNORMAL HIGH (ref <5.0)

## 2017-11-08 LAB — CBC
HCT: 45.6 % (ref 38.5–50.0)
HEMOGLOBIN: 16.1 g/dL (ref 13.2–17.1)
MCH: 32.3 pg (ref 27.0–33.0)
MCHC: 35.3 g/dL (ref 32.0–36.0)
MCV: 91.4 fL (ref 80.0–100.0)
MPV: 10.7 fL (ref 7.5–12.5)
Platelets: 143 10*3/uL (ref 140–400)
RBC: 4.99 10*6/uL (ref 4.20–5.80)
RDW: 11.8 % (ref 11.0–15.0)
WBC: 3.6 10*3/uL — ABNORMAL LOW (ref 3.8–10.8)

## 2017-11-08 LAB — COMPLETE METABOLIC PANEL WITH GFR
AG Ratio: 1.4 (calc) (ref 1.0–2.5)
ALKALINE PHOSPHATASE (APISO): 88 U/L (ref 40–115)
ALT: 31 U/L (ref 9–46)
AST: 32 U/L (ref 10–35)
Albumin: 4.4 g/dL (ref 3.6–5.1)
BILIRUBIN TOTAL: 0.6 mg/dL (ref 0.2–1.2)
BUN: 15 mg/dL (ref 7–25)
CHLORIDE: 104 mmol/L (ref 98–110)
CO2: 22 mmol/L (ref 20–32)
Calcium: 9.2 mg/dL (ref 8.6–10.3)
Creat: 0.75 mg/dL (ref 0.70–1.33)
GFR, Est African American: 121 mL/min/{1.73_m2} (ref >=60)
GFR, Est Non African American: 105 mL/min/{1.73_m2} (ref >=60)
GLUCOSE: 102 mg/dL — AB (ref 65–99)
Globulin: 3.2 g/dL (calc) (ref 1.9–3.7)
POTASSIUM: 4 mmol/L (ref 3.5–5.3)
Sodium: 137 mmol/L (ref 135–146)
Total Protein: 7.6 g/dL (ref 6.1–8.1)

## 2017-11-08 LAB — PROTIME-INR
INR: 1.1
Prothrombin Time: 11.5 s (ref 9.0–11.5)

## 2017-11-09 ENCOUNTER — Ambulatory Visit: Payer: 59 | Admitting: Osteopathic Medicine

## 2017-11-09 ENCOUNTER — Encounter: Payer: Self-pay | Admitting: Osteopathic Medicine

## 2017-11-09 VITALS — BP 118/74 | HR 81 | Temp 98.1°F | Wt 189.0 lb

## 2017-11-09 DIAGNOSIS — R7301 Impaired fasting glucose: Secondary | ICD-10-CM

## 2017-11-09 DIAGNOSIS — R5382 Chronic fatigue, unspecified: Secondary | ICD-10-CM | POA: Diagnosis not present

## 2017-11-09 DIAGNOSIS — I10 Essential (primary) hypertension: Secondary | ICD-10-CM

## 2017-11-09 DIAGNOSIS — M109 Gout, unspecified: Secondary | ICD-10-CM

## 2017-11-09 DIAGNOSIS — R202 Paresthesia of skin: Secondary | ICD-10-CM

## 2017-11-09 DIAGNOSIS — K7030 Alcoholic cirrhosis of liver without ascites: Secondary | ICD-10-CM

## 2017-11-09 MED ORDER — ALLOPURINOL 300 MG PO TABS: 300.0000 mg | ORAL_TABLET | Freq: Every day | ORAL | 3 refills | Status: DC

## 2017-11-09 MED ORDER — VARDENAFIL HCL 20 MG PO TABS: 10.0000 mg | ORAL_TABLET | Freq: Every day | ORAL | 5 refills | Status: DC | PRN

## 2017-11-09 NOTE — Patient Instructions (Signed)
Plan:  Additional labs today to evaluate fatigue, arm numbness  Levitra sent to pharmacy - let us know if this is crazy expensive  Try wrist splint used at night, consider neurology referral or sports medicine second opinion here in the office   Consider sleep study  Consider EGD to screen for esophageal varices   Overall, labs are looking a lot better! Congrats on cutting out some of the unhealthy stuff, and keep up the good work!

## 2017-11-09 NOTE — Progress Notes (Signed)
HPI: [name] Brown is a 52 y.o. male who  has a past medical history of Alcohol abuse, Allergy, Anxiety, Depression, Enlarged prostate, Gout, History of hematuria, Hyperlipidemia, Nocturia, and Right adrenal mass ([place]).  he presents to [facility] today, 11/09/17,  for chief complaint of:  Follow-up labs   Cirrhosis: had stopped drinking - 75 days sober! Labs are reflecting healthy lifestyle changes - TG and liver enzymes are better. Has been reluctant to follow up with GI, "they're not really doing anything." He and I discussed risks versus benefits of EGD, he is reluctant to get this done. We discussed risks of opting out of this test, too.   Hand numbness: worse at night, most nights for about 6 weeks or so. All fingers are involved, no skin color/temperature changes or swelling. No hx carpal tunnel or cubital tunnel.   Fatigue: chronic issue, no apnea or snoring.  ED: was on Levitra in the past with good success, requests refill of this.    We discussed lab results in detail - see labs drawn 11/07/17.      Past medical history, surgical history, and family history reviewed.  Current medication list and allergy/intolerance information reviewed.   (See remainder of HPI, ROS, Phys Exam below)  No results found.  Results for orders placed or performed in visit on 11/09/17 (from the past 72 hour(s))  Uric acid     Status: None   Collection Time: 11/09/17  4:16 PM  Result Value Ref Range   Uric Acid, Serum 5.9 4.0 - 8.0 mg/dL    Comment: Therapeutic target for gout patients: <6.0 mg/dL .   Hemoglobin A1c     Status: None   Collection Time: 11/09/17  4:16 PM  Result Value Ref Range   Hgb A1c MFr Bld 5.1 <5.7 % of total Hgb    Comment: For the purpose of screening for the presence of diabetes: . <5.7%       Consistent with the absence of diabetes 5.7-6.4%    Consistent with increased risk for diabetes             (prediabetes) > or =6.5%   Consistent with diabetes . This assay result is consistent with a decreased risk of diabetes. . Currently, no consensus exists regarding use of hemoglobin A1c for diagnosis of diabetes in children. . According to American Diabetes Association (ADA) guidelines, hemoglobin A1c <7.0% represents optimal control in non-pregnant diabetic patients. Different metrics may apply to specific patient populations.  Standards of Medical Care in Diabetes(ADA). .    Mean Plasma Glucose 100 (calc)   eAG (mmol/L) 5.5 (calc)  TSH     Status: None   Collection Time: 11/09/17  4:16 PM  Result Value Ref Range   TSH 1.79 0.40 - 4.50 mIU/L  Vitamin B12     Status: None   Collection Time: 11/09/17  4:19 PM  Result Value Ref Range   Vitamin B-12 748 200 - 1,100 pg/mL     ASSESSMENT/PLAN:   Alcoholic cirrhosis of liver without ascites ([place]) - encouraged follow-up with GI, I'm very happy that he's stayed sober! If he hasn't done this by next time we should at least restart routine ultrasounds  Essential hypertension - controlled  Gout, unspecified cause, unspecified chronicity, unspecified site - Uric acid <6, he's alternating days w/ allopurinol  - Plan: allopurinol (ZYLOPRIM) 300 MG tablet, Uric acid  Chronic fatigue - Plan: TSH  Paresthesia of arm - Not reproducible on  exam, question carpal/cubital tunnel.  No neck or shoulder pain.  Discussed referral for neuro/EMG testing, PT. Would like to defer for now. - Plan: Vitamin B12, CANCELED: Vitamin B12  Elevated fasting glucose - Plan: Hemoglobin A1c   No orders of the defined types were placed in this encounter.   Patient Instructions  Plan:  Additional labs today to evaluate fatigue, arm numbness  Levitra sent to pharmacy - let us know if this is crazy expensive  Try wrist splint used at night, consider neurology referral or sports medicine second opinion here in the office   Consider sleep study  Consider EGD to screen for esophageal  varices   Overall, labs are looking a lot better! Congrats on cutting out some of the unhealthy stuff, and keep up the good work!     Follow-up plan: Return in about 6 months (around 05/12/2018) for labs recheck, sooner if needed .     ############################################ ############################################ ############################################ ############################################    Outpatient Encounter Medications as of 11/09/2017  Medication Sig  . allopurinol (ZYLOPRIM) 300 MG tablet Take 1 tablet (300 mg total) by mouth daily. TAKE 1 TABLET(300 MG) BY MOUTH DAILY  . busPIRone (BUSPAR) 15 MG tablet TAKE 1 TABLET(15 MG) BY MOUTH TWICE DAILY. FOLLOW UP APPOINTMENT WITH PCP BEFORE MORE REFILLS  . finasteride (PROSCAR) 5 MG tablet Take 1 tablet (5 mg total) by mouth every evening.  . indomethacin (INDOCIN) 25 MG capsule Take 2 capsules (50 mg total) by mouth 3 (three) times daily as needed. For 1-3 days until gout flare subsides, then reduce dose to 1 capsule (25 mg total) twice daily as needed  . Multiple Vitamin (MULTIVITAMIN WITH MINERALS) TABS tablet Take 1 tablet by mouth daily.  . Omega-3 Fatty Acids (FISH OIL) 1200 MG CAPS Take 1,200 mg by mouth daily.  Marland Kitchen omeprazole (PRILOSEC) 10 MG capsule Take 10 mg by mouth daily.  . podofilox (CONDYLOX) 0.5 % external solution APPLY TOPICALLY TWICE DAILY  . zolpidem (AMBIEN) 5 MG tablet TAKE 1/2 TO 1 TABLET BY MOUTH AT BEDTIME AS NEEDED FOR SLEEP  . [DISCONTINUED] traZODone (DESYREL) 50 MG tablet Take 1 tablet (50 mg total) by mouth at bedtime as needed and may repeat dose one time if needed for sleep.   No facility-administered encounter medications on file as of 11/09/2017.    Allergies  Allergen Reactions  . Viibryd [Vilazodone Hcl]     Pt lost sense of taste       Review of Systems:  Constitutional: No recent illness  HEENT: No  headache, no vision change  Cardiac: No  chest pain, No   pressure, No palpitations  Respiratory:  No  shortness of breath. No  Cough  Gastrointestinal: No  abdominal pain, no change on bowel habits  Musculoskeletal: No new myalgia/arthralgia  Skin: No  Rash  Hem/Onc: No  easy bruising/bleeding, No  abnormal lumps/bumps  Neurologic: No  weakness, No  Dizziness  Psychiatric: No  concerns with depression, No  concerns with anxiety  Exam:  BP 118/74 (BP Location: Left Arm, Patient Position: Sitting, Cuff Size: Normal)   Pulse 81   Temp 98.1 F (36.7 C) (Oral)   Wt 189 lb (85.7 kg)   BMI 29.60 kg/m   Constitutional: VS see above. General Appearance: alert, well-developed, well-nourished, NAD  Eyes: Normal lids and conjunctive, non-icteric sclera  Ears, Nose, Mouth, Throat: MMM, Normal external inspection ears/nares/mouth/lips/gums.  Neck: No masses, trachea midline.   Respiratory: Normal respiratory effort. no wheeze, no rhonchi,  no rales  Cardiovascular: S1/S2 normal, no murmur, no rub/gallop auscultated. RRR.   Musculoskeletal: Gait normal. Symmetric and independent movement of all extremities  Neurological: Normal balance/coordination. No tremor.  Skin: warm, dry, intact.   Psychiatric: Normal judgment/insight. Normal mood and affect. Oriented x3.   Visit summary with medication list and pertinent instructions was printed for patient to review, advised to alert [place] if any changes needed. All questions at time of visit were answered - patient instructed to contact office with any additional concerns. ER/RTC precautions were reviewed with the patient and understanding verbalized.   Follow-up plan: Return in about 6 months (around 05/12/2018) for labs recheck, sooner if needed .  Note: Total time spent 40 minutes, greater than 50% of the visit was spent face-to-face counseling and coordinating care for the following: The primary encounter diagnosis was Alcoholic cirrhosis of liver without ascites ([place]). Diagnoses of Essential  hypertension, Gout, unspecified cause, unspecified chronicity, unspecified site, Chronic fatigue, Paresthesia of arm, and Elevated fasting glucose were also pertinent to this visit.Marland Kitchen  Please note: voice recognition software was used to produce this document, and typos may escape review. Please contact Dr. Sheppard Coil for any needed clarifications.

## 2017-11-10 ENCOUNTER — Encounter: Payer: Self-pay | Admitting: Osteopathic Medicine

## 2017-11-10 DIAGNOSIS — R7301 Impaired fasting glucose: Secondary | ICD-10-CM | POA: Insufficient documentation

## 2017-11-10 DIAGNOSIS — R202 Paresthesia of skin: Secondary | ICD-10-CM | POA: Insufficient documentation

## 2017-11-10 LAB — HEMOGLOBIN A1C
HEMOGLOBIN A1C: 5.1 %{Hb} (ref <5.7)
Mean Plasma Glucose: 100 (calc)
eAG (mmol/L): 5.5 (calc)

## 2017-11-10 LAB — URIC ACID: Uric Acid, Serum: 5.9 mg/dL (ref 4.0–8.0)

## 2017-11-10 LAB — TSH: TSH: 1.79 mIU/L (ref 0.40–4.50)

## 2017-11-10 LAB — VITAMIN B12: Vitamin B-12: 748 pg/mL (ref 200–1100)

## 2017-12-07 ENCOUNTER — Other Ambulatory Visit: Payer: Self-pay | Admitting: Osteopathic Medicine

## 2017-12-07 DIAGNOSIS — F411 Generalized anxiety disorder: Secondary | ICD-10-CM

## 2017-12-08 NOTE — Telephone Encounter (Signed)
[facility] pharmacy requesting med RFs for podofilox and buspirone.

## 2017-12-08 NOTE — Telephone Encounter (Signed)
Pt has been updated.  

## 2018-02-09 ENCOUNTER — Other Ambulatory Visit: Payer: Self-pay | Admitting: Osteopathic Medicine

## 2018-02-09 NOTE — Telephone Encounter (Signed)
Left vm msg for pt regarding med RF. Call back information provided.

## 2018-02-09 NOTE — Telephone Encounter (Signed)
[facility] pharmacy requesting med RF for podofilox. Pls advise if med RF appropriate. Thanks.

## 2018-03-08 ENCOUNTER — Other Ambulatory Visit: Payer: Self-pay | Admitting: Osteopathic Medicine

## 2018-03-08 DIAGNOSIS — G4700 Insomnia, unspecified: Secondary | ICD-10-CM

## 2018-03-08 NOTE — Telephone Encounter (Signed)
Pt has been updated.  

## 2018-03-08 NOTE — Telephone Encounter (Signed)
[facility] requesting med RF for zolpidem.

## 2018-05-07 ENCOUNTER — Other Ambulatory Visit: Payer: Self-pay | Admitting: Gastroenterology

## 2018-05-07 DIAGNOSIS — K7030 Alcoholic cirrhosis of liver without ascites: Secondary | ICD-10-CM

## 2018-05-16 ENCOUNTER — Ambulatory Visit
Admission: RE | Admit: 2018-05-16 | Discharge: 2018-05-16 | Disposition: A | Discharge status: Home / Self Care | Payer: 59 | Source: Ambulatory Visit | Attending: Gastroenterology | Admitting: Gastroenterology

## 2018-05-16 ENCOUNTER — Other Ambulatory Visit: Payer: Self-pay | Admitting: Osteopathic Medicine

## 2018-05-16 DIAGNOSIS — G4700 Insomnia, unspecified: Secondary | ICD-10-CM

## 2018-05-16 NOTE — Telephone Encounter (Signed)
[facility] drug store requesting med refills for zolpidem and podofilox.

## 2018-05-30 ENCOUNTER — Telehealth: Payer: Self-pay

## 2018-05-30 DIAGNOSIS — F1010 Alcohol abuse, uncomplicated: Secondary | ICD-10-CM

## 2018-05-30 DIAGNOSIS — K7030 Alcoholic cirrhosis of liver without ascites: Secondary | ICD-10-CM

## 2018-05-30 DIAGNOSIS — R17 Unspecified jaundice: Secondary | ICD-10-CM

## 2018-05-30 DIAGNOSIS — M109 Gout, unspecified: Secondary | ICD-10-CM

## 2018-05-30 DIAGNOSIS — I10 Essential (primary) hypertension: Secondary | ICD-10-CM

## 2018-05-30 NOTE — Telephone Encounter (Signed)
Pt called requesting lab order to complete. Pt would like lab order to include uric acid testing. Pt will have labs done next week and will make an appt to follow up with provider.

## 2018-05-31 NOTE — Telephone Encounter (Signed)
Pt has been updated. No other inquiries during call.  

## 2018-05-31 NOTE — Telephone Encounter (Signed)
Orders are in

## 2018-07-13 ENCOUNTER — Other Ambulatory Visit: Payer: Self-pay | Admitting: Osteopathic Medicine

## 2018-07-13 DIAGNOSIS — F411 Generalized anxiety disorder: Secondary | ICD-10-CM

## 2018-07-13 NOTE — Telephone Encounter (Signed)
Requested medication (s) are due for refill today: yes  Requested medication (s) are on the active medication list: yes  Last refill:  12/08/17  Future visit scheduled: no  Notes to clinic:  Needs appointment and medication not delegated for NT to refill   Requested Prescriptions  Pending Prescriptions Disp Refills   busPIRone (BUSPAR) 15 MG tablet [Pharmacy Med Name: BUSPIRONE 15MG  TABLETS] 60 tablet 3    Sig: TAKE 1 TABLET(15 MG) BY MOUTH TWICE DAILY     Not Delegated - Psychiatry:  Anxiolytics/Hypnotics Failed - 07/13/2018 10:22 AM      Failed - This refill cannot be delegated      Failed - Urine Drug Screen completed in last 360 days.      Failed - Valid encounter within last 6 months    Recent Outpatient Visits          8 months ago Alcoholic cirrhosis of liver without ascites [facility])   [facility], [place], DO   1 year ago Alcoholic cirrhosis of liver without ascites [facility])   [facility] [facility], Lanelle Bal, DO   1 year ago Gout, unspecified cause, unspecified chronicity, unspecified site   [facility], Lanelle Bal, DO   2 years ago Alcoholic cirrhosis of liver without ascites [facility])   [facility] [place], Lanelle Bal, DO   2 years ago [place], [place], DO

## 2018-07-28 LAB — COMPLETE METABOLIC PANEL WITH GFR
AG Ratio: 1.5 (calc) (ref 1.0–2.5)
ALT: 24 U/L (ref 9–46)
AST: 25 U/L (ref 10–35)
Albumin: 4.7 g/dL (ref 3.6–5.1)
Alkaline phosphatase (APISO): 83 U/L (ref 35–144)
BUN: 14 mg/dL (ref 7–25)
CHLORIDE: 104 mmol/L (ref 98–110)
CO2: 25 mmol/L (ref 20–32)
Calcium: 9.3 mg/dL (ref 8.6–10.3)
Creat: 0.76 mg/dL (ref 0.70–1.33)
GFR, Est African American: 121 mL/min/{1.73_m2} (ref >=60)
GFR, Est Non African American: 104 mL/min/{1.73_m2} (ref >=60)
Globulin: 3.1 g/dL (calc) (ref 1.9–3.7)
Glucose, Bld: 105 mg/dL — ABNORMAL HIGH (ref 65–99)
Potassium: 4.2 mmol/L (ref 3.5–5.3)
Sodium: 138 mmol/L (ref 135–146)
Total Bilirubin: 0.6 mg/dL (ref 0.2–1.2)
Total Protein: 7.8 g/dL (ref 6.1–8.1)

## 2018-07-28 LAB — LIPID PANEL
Cholesterol: 224 mg/dL — ABNORMAL HIGH (ref <200)
HDL: 39 mg/dL — ABNORMAL LOW (ref >=40)
LDL Cholesterol (Calc): 155 mg/dL (calc) — ABNORMAL HIGH
Non-HDL Cholesterol (Calc): 185 mg/dL (calc) — ABNORMAL HIGH (ref <130)
Total CHOL/HDL Ratio: 5.7 (calc) — ABNORMAL HIGH (ref <5.0)
Triglycerides: 166 mg/dL — ABNORMAL HIGH (ref <150)

## 2018-07-28 LAB — CBC
HCT: 48.1 % (ref 38.5–50.0)
Hemoglobin: 16.3 g/dL (ref 13.2–17.1)
MCH: 30.4 pg (ref 27.0–33.0)
MCHC: 33.9 g/dL (ref 32.0–36.0)
MCV: 89.6 fL (ref 80.0–100.0)
MPV: 10.9 fL (ref 7.5–12.5)
PLATELETS: 175 10*3/uL (ref 140–400)
RBC: 5.37 10*6/uL (ref 4.20–5.80)
RDW: 12.7 % (ref 11.0–15.0)
WBC: 4.3 10*3/uL (ref 3.8–10.8)

## 2018-07-28 LAB — TEST AUTHORIZATION

## 2018-07-28 LAB — HEMOGLOBIN A1C W/OUT EAG: Hgb A1c MFr Bld: 5.2 % of total Hgb (ref <5.7)

## 2018-07-28 LAB — TSH: TSH: 1.32 mIU/L (ref 0.40–4.50)

## 2018-07-28 LAB — URIC ACID: Uric Acid, Serum: 7 mg/dL (ref 4.0–8.0)

## 2018-07-30 ENCOUNTER — Other Ambulatory Visit: Payer: Self-pay | Admitting: Osteopathic Medicine

## 2018-07-30 NOTE — Telephone Encounter (Signed)
Please review for refill- patient at [facility] 

## 2018-08-06 ENCOUNTER — Other Ambulatory Visit: Payer: Self-pay | Admitting: Osteopathic Medicine

## 2018-08-06 DIAGNOSIS — G4700 Insomnia, unspecified: Secondary | ICD-10-CM

## 2018-08-07 NOTE — Telephone Encounter (Signed)
Left a detailed vm msg for pt regarding med refill sent to local pharmacy. Call back info provided. 

## 2018-08-07 NOTE — Telephone Encounter (Signed)
[facility] pharmacy requesting me refill for zolpidem.

## 2018-08-09 ENCOUNTER — Ambulatory Visit (INDEPENDENT_AMBULATORY_CARE_PROVIDER_SITE_OTHER): Payer: 59 | Admitting: Osteopathic Medicine

## 2018-08-09 ENCOUNTER — Encounter: Payer: Self-pay | Admitting: Osteopathic Medicine

## 2018-08-09 VITALS — BP 120/77 | HR 77 | Temp 97.7°F | Wt 206.8 lb

## 2018-08-09 DIAGNOSIS — E782 Mixed hyperlipidemia: Secondary | ICD-10-CM | POA: Diagnosis not present

## 2018-08-09 DIAGNOSIS — M109 Gout, unspecified: Secondary | ICD-10-CM | POA: Diagnosis not present

## 2018-08-09 DIAGNOSIS — Z8249 Family history of ischemic heart disease and other diseases of the circulatory system: Secondary | ICD-10-CM

## 2018-08-09 DIAGNOSIS — K7030 Alcoholic cirrhosis of liver without ascites: Secondary | ICD-10-CM

## 2018-08-09 DIAGNOSIS — Z9189 Other specified personal risk factors, not elsewhere classified: Secondary | ICD-10-CM | POA: Insufficient documentation

## 2018-08-09 DIAGNOSIS — R7301 Impaired fasting glucose: Secondary | ICD-10-CM

## 2018-08-09 NOTE — Progress Notes (Signed)
HPI: [name] Brown is a 53 y.o. male who  has a past medical history of Alcohol abuse, Allergy, Anxiety, Depression, Enlarged prostate, Gout, History of hematuria, Hyperlipidemia, Nocturia, and Right adrenal mass ([place]).  he presents to [facility] today, 08/09/18,  for chief complaint of:  Lab recheck   Following up for lab check  History of cirrhosis  Last Liver US 04/2018 Cholesterol was elevated, pt reports not eating wel and not exercising, but he is ok with this since he has been off of alcohol for almost a year!   Wt Readings from Last 3 Encounters:  08/09/18 206 lb 12.8 oz (93.8 kg)  11/09/17 189 lb (85.7 kg)  11/16/16 188 lb (85.3 kg)       At today's visit 08/09/18 ... PMH, PSH, FH reviewed and updated as needed.  Current medication list and allergy/intolerance hx reviewed and updated as needed. (See remainder of HPI, ROS, Phys Exam below)          ASSESSMENT/PLAN: The primary encounter diagnosis was Alcoholic cirrhosis of liver without ascites ([place]). Diagnoses of Gout of vertebrae, unspecified cause, unspecified chronicity, Mixed hyperlipidemia, Elevated fasting glucose, At risk for obstructive sleep apnea, and Family history of coronary artery disease were also pertinent to this visit.   Consider injectable antihyperlipidemic Rx   Consider allopurinol dose increase, he's taking 1/2 tablet of allopurinol 300 mg  Congrats on almost a year off EtOH!  Consider sleep study for daytime somnolence   Orders Placed This Encounter  Procedures  . CBC  . COMPLETE METABOLIC PANEL WITH GFR  . Lipid panel  . TSH  . Hemoglobin A1c  . Uric acid         Follow-up plan: Return in about 6 months (around 02/07/2019) for [facility] PHYSICAL w/ routine labs prior to visit  .                                                 ################################################# ################################################# ################################################# #################################################    Current Meds  Medication Sig  . allopurinol (ZYLOPRIM) 300 MG tablet Take 1 tablet (300 mg total) by mouth daily. TAKE 1 TABLET(300 MG) BY MOUTH DAILY  . busPIRone (BUSPAR) 15 MG tablet Take 1 tablet (15 mg total) by mouth 2 (two) times daily. Due for follow up  . finasteride (PROSCAR) 5 MG tablet Take 1 tablet (5 mg total) by mouth every evening.  . indomethacin (INDOCIN) 25 MG capsule Take 2 capsules (50 mg total) by mouth 3 (three) times daily as needed. For 1-3 days until gout flare subsides, then reduce dose to 1 capsule (25 mg total) twice daily as needed  . Multiple Vitamin (MULTIVITAMIN WITH MINERALS) TABS tablet Take 1 tablet by mouth daily.  . Omega-3 Fatty Acids (FISH OIL) 1200 MG CAPS Take 1,200 mg by mouth daily.  . podofilox (CONDYLOX) 0.5 % external solution APPLY TOPICALLY TWICE DAILY  . vardenafil (LEVITRA) 20 MG tablet Take 0.5-1 tablets (10-20 mg total) by mouth daily as needed for erectile dysfunction.  Marland Kitchen zolpidem (AMBIEN) 5 MG tablet TAKE 1/2 TO 1 TABLET BY MOUTH AT BEDTIME AS NEEDED FOR SLEEP    Allergies  Allergen Reactions  . Viibryd [Vilazodone Hcl]     Pt lost sense of taste        Review of Systems:  Constitutional: No recent  illness, +fatigue  HEENT: No  headache  Cardiac: No  chest pain, no LE edema   Respiratory:  No  shortness of breath  Gastrointestinal: No  abdominal pain  Musculoskeletal: No new myalgia/arthralgia  Skin: No  Rash  Hem/Onc: No  easy bruising/bleeding  Neurologic: No  weakness, No  Dizziness  Psychiatric: No  concerns with depression, No  concerns with anxiety  Exam:  BP 120/77 (BP Location: Left Arm, Patient Position:  Sitting, Cuff Size: Normal)   Pulse 77   Temp 97.7 F (36.5 C) (Oral)   Wt 206 lb 12.8 oz (93.8 kg)   BMI 32.39 kg/m   Constitutional: VS see above. General Appearance: alert, well-developed, well-nourished, NAD  Eyes: Normal lids and conjunctive, non-icteric sclera  Ears, Nose, Mouth, Throat: MMM, Normal external inspection ears/nares/mouth/lips/gums.  Neck: No masses, trachea midline.   Respiratory: Normal respiratory effort. no wheeze, no rhonchi, no rales  Cardiovascular: S1/S2 normal, no murmur, no rub/gallop auscultated. RRR.   Musculoskeletal: Gait normal. Symmetric and independent movement of all extremities  Neurological: Normal balance/coordination. No tremor.  Skin: warm, dry, intact.   Psychiatric: Normal judgment/insight. Normal mood and affect. Oriented x3.       Visit summary with medication list and pertinent instructions was printed for patient to review, patient was advised to alert [place] if any updates are needed. All questions at time of visit were answered - patient instructed to contact office with any additional concerns. ER/RTC precautions were reviewed with the patient and understanding verbalized.   Note: Total time spent 25 minutes, greater than 50% of the visit was spent face-to-face counseling and coordinating care for the following: The primary encounter diagnosis was Alcoholic cirrhosis of liver without ascites ([place]). Diagnoses of Gout of vertebrae, unspecified cause, unspecified chronicity, Mixed hyperlipidemia, Elevated fasting glucose, At risk for obstructive sleep apnea, and Family history of coronary artery disease were also pertinent to this visit.Marland Kitchen  Please note: voice recognition software was used to produce this document, and typos may escape review. Please contact Dr. Sheppard Coil for any needed clarifications.    Follow up plan: Return in about 6 months (around 02/07/2019) for [facility] PHYSICAL w/ routine labs prior to visit .

## 2018-08-20 ENCOUNTER — Other Ambulatory Visit: Payer: Self-pay | Admitting: Osteopathic Medicine

## 2018-08-20 DIAGNOSIS — F411 Generalized anxiety disorder: Secondary | ICD-10-CM

## 2018-08-20 NOTE — Telephone Encounter (Signed)
[facility] does not fill for this provider.  Buspar refill request

## 2018-09-06 LAB — PSA: PSA: 0.61

## 2018-09-18 ENCOUNTER — Encounter: Payer: Self-pay | Admitting: Osteopathic Medicine

## 2018-09-26 ENCOUNTER — Other Ambulatory Visit: Payer: Self-pay | Admitting: Osteopathic Medicine

## 2018-09-26 NOTE — Telephone Encounter (Signed)
Routing to Dr Redgie Grayer rx refill pool

## 2018-09-27 ENCOUNTER — Encounter: Payer: Self-pay | Admitting: Osteopathic Medicine

## 2018-10-17 ENCOUNTER — Other Ambulatory Visit: Payer: Self-pay | Admitting: Osteopathic Medicine

## 2018-10-17 DIAGNOSIS — G4700 Insomnia, unspecified: Secondary | ICD-10-CM

## 2018-10-17 NOTE — Telephone Encounter (Signed)
[facility] requesting med refills for zolpidem.

## 2018-10-18 ENCOUNTER — Other Ambulatory Visit: Payer: Self-pay | Admitting: Osteopathic Medicine

## 2018-10-23 ENCOUNTER — Other Ambulatory Visit: Payer: Self-pay

## 2018-10-23 MED ORDER — PODOFILOX 0.5 % EX SOLN: CUTANEOUS | 0 refills | Status: DC

## 2019-02-01 ENCOUNTER — Other Ambulatory Visit: Payer: Self-pay | Admitting: Osteopathic Medicine

## 2019-02-01 DIAGNOSIS — G4700 Insomnia, unspecified: Secondary | ICD-10-CM

## 2019-02-01 NOTE — Telephone Encounter (Signed)
[facility] requesting med refills for zolpidem and podofilox.

## 2019-02-07 ENCOUNTER — Encounter: Payer: Self-pay | Admitting: Osteopathic Medicine

## 2019-02-07 LAB — COMPLETE METABOLIC PANEL WITH GFR
AG Ratio: 1.4 (calc) (ref 1.0–2.5)
ALT: 19 U/L (ref 9–46)
AST: 24 U/L (ref 10–35)
Albumin: 4.5 g/dL (ref 3.6–5.1)
Alkaline phosphatase (APISO): 73 U/L (ref 35–144)
BUN: 15 mg/dL (ref 7–25)
CO2: 23 mmol/L (ref 20–32)
Calcium: 9.6 mg/dL (ref 8.6–10.3)
Chloride: 104 mmol/L (ref 98–110)
Creat: 0.8 mg/dL (ref 0.70–1.33)
GFR, Est African American: 117 mL/min/{1.73_m2} (ref >=60)
GFR, Est Non African American: 101 mL/min/{1.73_m2} (ref >=60)
Globulin: 3.2 g/dL (calc) (ref 1.9–3.7)
Glucose, Bld: 100 mg/dL — ABNORMAL HIGH (ref 65–99)
Potassium: 4.3 mmol/L (ref 3.5–5.3)
Sodium: 139 mmol/L (ref 135–146)
Total Bilirubin: 0.5 mg/dL (ref 0.2–1.2)
Total Protein: 7.7 g/dL (ref 6.1–8.1)

## 2019-02-07 LAB — TSH: TSH: 1.5 mIU/L (ref 0.40–4.50)

## 2019-02-07 LAB — CBC
HCT: 48 % (ref 38.5–50.0)
Hemoglobin: 16.2 g/dL (ref 13.2–17.1)
MCH: 30.6 pg (ref 27.0–33.0)
MCHC: 33.8 g/dL (ref 32.0–36.0)
MCV: 90.7 fL (ref 80.0–100.0)
MPV: 10.7 fL (ref 7.5–12.5)
Platelets: 182 10*3/uL (ref 140–400)
RBC: 5.29 10*6/uL (ref 4.20–5.80)
RDW: 13.1 % (ref 11.0–15.0)
WBC: 5.1 10*3/uL (ref 3.8–10.8)

## 2019-02-07 LAB — HEMOGLOBIN A1C
Hgb A1c MFr Bld: 5.2 % of total Hgb (ref <5.7)
Mean Plasma Glucose: 103 (calc)
eAG (mmol/L): 5.7 (calc)

## 2019-02-07 LAB — LIPID PANEL
Cholesterol: 222 mg/dL — ABNORMAL HIGH (ref <200)
HDL: 40 mg/dL (ref >=40)
LDL Cholesterol (Calc): 151 mg/dL (calc) — ABNORMAL HIGH
Non-HDL Cholesterol (Calc): 182 mg/dL (calc) — ABNORMAL HIGH (ref <130)
Total CHOL/HDL Ratio: 5.6 (calc) — ABNORMAL HIGH (ref <5.0)
Triglycerides: 175 mg/dL — ABNORMAL HIGH (ref <150)

## 2019-02-07 LAB — URIC ACID: Uric Acid, Serum: 6.2 mg/dL (ref 4.0–8.0)

## 2019-02-11 ENCOUNTER — Ambulatory Visit (INDEPENDENT_AMBULATORY_CARE_PROVIDER_SITE_OTHER): Payer: 59 | Admitting: Osteopathic Medicine

## 2019-02-11 ENCOUNTER — Other Ambulatory Visit: Payer: Self-pay

## 2019-02-11 ENCOUNTER — Encounter: Payer: Self-pay | Admitting: Osteopathic Medicine

## 2019-02-11 VITALS — BP 131/82 | HR 68 | Temp 98.1°F | Wt 201.0 lb

## 2019-02-11 DIAGNOSIS — Z0000 Encounter for general adult medical examination without abnormal findings: Secondary | ICD-10-CM

## 2019-02-11 DIAGNOSIS — E782 Mixed hyperlipidemia: Secondary | ICD-10-CM | POA: Diagnosis not present

## 2019-02-11 DIAGNOSIS — K7030 Alcoholic cirrhosis of liver without ascites: Secondary | ICD-10-CM

## 2019-02-11 NOTE — Patient Instructions (Addendum)
General Preventive Care  Most recent routine screening lipids/other labs: already done  Tobacco: don't!   Alcohol: don't!   Exercise: as tolerated to reduce risk of cardiovascular disease and diabetes. Strength training will also prevent osteoporosis.   Mental health: if need for mental health care (medicines, counseling, other), or concerns about moods, please let me know!   Sexual health: if need for STD testing, or if concerns with libido/pain problems, please let me know!   Advanced Directive: Living Will and/or Healthcare Power of Attorney recommended for all adults, regardless of age or health.  Vaccines  Flu vaccine: recommended for almost everyone, every fall.   Shingles vaccine: Shingrix recommended after age 82.   Pneumonia vaccines: up to date.   Tetanus booster: Tdap recommended every 10 years. Due 2026.  Cancer screenings   Colon cancer screening:per GI recommendations.   Prostate cancer screening: PSA normal.   Lung cancer screening: not needed for non-smokers  Infection screenings . HIV, Gonorrhea/Chlamydia: screening as needed . Hepatitis C: negative 2 years ago  . TB: certain at-risk populations, or depending on work requirements and/or travel history Other . Bone Density Test: recommended for men at age 24, sooner depending on risk factors  . Abdominal Aortic Aneurysm: screening with ultrasound recommended once for men age 30-75 who have ever smoked

## 2019-02-11 NOTE — Progress Notes (Signed)
HPI: [name] Brown is a 53 y.o. male who  has a past medical history of Alcohol abuse, Allergy, Anxiety, Depression, Enlarged prostate, Gout, History of hematuria, Hyperlipidemia, Nocturia, and Right adrenal mass ([place]).  he presents to [facility] today, 02/11/19,  for chief complaint of: Annual physical     Patient here for annual physical / wellness exam.  See preventive care reviewed as below.  Recent labs reviewed in detail with the patient.   Additional concerns today include:   Brief episode of dizziness about 6 weeks ago, nothing since.  He felt lightheaded.  Not sure if he was well-hydrated that day.  Following up with GI once a year.  History of cirrhosis.  Liver enzymes look good at this point  Has been working on exercise, finds difficult to maintain healthy diet, he has been sober for a while now but seems to have substituted ice cream and other sweets/snacks for his alcohol habit   Past medical, surgical, social and family history reviewed:  Patient Active Problem List   Diagnosis Date Noted  . At risk for obstructive sleep apnea 08/09/2018  . Family history of coronary artery disease 08/09/2018  . Paresthesia of arm 11/10/2017  . Elevated fasting glucose 11/10/2017  . Hepatic cirrhosis ([place]) 06/02/2016  . Insomnia 06/02/2016  . Incisional hernia s/p laparoscopic repair with mesh 02/02/16 02/02/2016  . Incisional hernia, without obstruction or gangrene 01/04/2016  . Benign mass of adrenal gland 05/25/2015  . History of colonic polyps 05/05/2015  . Hematuria 03/26/2015  . Prostatitis 03/26/2015  . Anxiety state 02/03/2015  . Essential hypertension 05/27/2014  . Elevated LFTs 05/27/2014  . Hyperlipidemia   . Substance induced mood disorder ([place]) 08/23/2013  . Thrombocytopenia ([place]) 08/14/2013  . Transaminitis 08/14/2013  . Total bilirubin, elevated 08/14/2013  . Alcohol withdrawal ([place]) 08/12/2013  . Depression 08/12/2013   . Gout 08/12/2013  . Alcohol abuse 08/08/2013    Past Surgical History:  Procedure Laterality Date  . EYE SURGERY  12-13 yrs ago   lasix eye surgery  . INCISIONAL HERNIA REPAIR N/A 02/02/2016   Procedure: LAPAROSCOPIC INCISIONAL HERNIA;  Surgeon: Jackolyn Confer, MD;  Location: [facility];  Service: General;  Laterality: N/A;  . INSERTION OF MESH N/A 02/02/2016   Procedure: INSERTION OF MESH;  Surgeon: Jackolyn Confer, MD;  Location: [facility];  Service: General;  Laterality: N/A;  . KNEE SURGERY Left 1989   arthroscopy  . nerve cautery     jaw bone   . ROBOTIC ADRENALECTOMY Right 08/05/2015   Procedure: XI ROBOTIC RIGHT ADRENALECTOMY ;  Surgeon: Alexis Frock, MD;  Location: [facility];  Service: Urology;  Laterality: Right;    Social History   Tobacco Use  . Smoking status: Never Smoker  . Smokeless tobacco: Never Used  Substance Use Topics  . Alcohol use: Yes    Alcohol/week: 75.0 standard drinks    Types: 60 Cans of beer, 15 Shots of liquor per week    Comment: 6-7- drinks per day    Family History  Problem Relation Age of Onset  . Hyperlipidemia Father   . Heart failure Father   . Alcohol abuse Maternal Grandfather   . Colon cancer Neg Hx   . Stomach cancer Neg Hx   . Rectal cancer Neg Hx   . Colon polyps Neg Hx      Current medication list and allergy/intolerance information reviewed:    Current Outpatient Medications  Medication Sig Dispense Refill  .  allopurinol (ZYLOPRIM) 300 MG tablet Take 1 tablet (300 mg total) by mouth daily. TAKE 1 TABLET(300 MG) BY MOUTH DAILY 90 tablet 3  . busPIRone (BUSPAR) 15 MG tablet Take 1 tablet (15 mg total) by mouth 2 (two) times daily. 180 tablet 1  . finasteride (PROSCAR) 5 MG tablet Take 1 tablet (5 mg total) by mouth every evening. 90 tablet 3  . indomethacin (INDOCIN) 25 MG capsule Take 2 capsules (50 mg total) by mouth 3 (three) times daily as needed. For 1-3 days until gout flare subsides, then reduce dose to 1 capsule (25 mg  total) twice daily as needed 30 capsule 1  . Multiple Vitamin (MULTIVITAMIN WITH MINERALS) TABS tablet Take 1 tablet by mouth daily.    . Omega-3 Fatty Acids (FISH OIL) 1200 MG CAPS Take 1,200 mg by mouth daily.    Marland Kitchen omeprazole (PRILOSEC) 10 MG capsule Take 10 mg by mouth daily.    . podofilox (CONDYLOX) 0.5 % external solution APPLY EXTERNALLY TO THE AFFECTED AREA TWICE DAILY 3.5 mL 0  . vardenafil (LEVITRA) 20 MG tablet Take 0.5-1 tablets (10-20 mg total) by mouth daily as needed for erectile dysfunction. 10 tablet 5  . zolpidem (AMBIEN) 5 MG tablet TAKE 1/2 TO 1 TABLET BY MOUTH AT BEDTIME AS NEEDED FOR SLEEP 30 tablet 0   No current facility-administered medications for this visit.     Allergies  Allergen Reactions  . Viibryd [Vilazodone Hcl]     Pt lost sense of taste       Review of Systems:  Constitutional:  No  fever, no chills, No recent illness, No unintentional weight changes. No significant fatigue.   HEENT: No  headache, no vision change, no hearing change, No sore throat, No  sinus pressure  Cardiac: No  chest pain, No  pressure, No palpitations, No  Orthopnea  Respiratory:  No  shortness of breath. No  Cough  Gastrointestinal: No  abdominal pain, No  nausea, No  vomiting,  No  blood in stool, No  diarrhea, No  constipation   Musculoskeletal: No new myalgia/arthralgia  Skin: No  Rash, No other wounds/concerning lesions  Genitourinary: No  incontinence, No  abnormal genital bleeding, No abnormal genital discharge  Hem/Onc: No  easy bruising/bleeding, No  abnormal lymph node  Endocrine: No cold intolerance,  No heat intolerance. No polyuria/polydipsia/polyphagia   Neurologic: No  weakness, +dizziness, No  slurred speech/focal weakness/facial droop  Psychiatric: No  concerns with depression, No  concerns with anxiety, No sleep problems, No mood problems  Exam:  BP 131/82 (BP Location: Left Arm, Patient Position: Sitting, Cuff Size: Normal)   Pulse 68   Temp  98.1 F (36.7 C) (Oral)   Wt 201 lb (91.2 kg)   BMI 31.48 kg/m   Constitutional: VS see above. General Appearance: alert, well-developed, well-nourished, NAD  Eyes: Normal lids and conjunctive, non-icteric sclera  Ears, Nose, Mouth, Throat: Mask in place, TM normal bilaterally  Neck: No masses, trachea midline. No thyroid enlargement. No tenderness/mass appreciated. No lymphadenopathy  Respiratory: Normal respiratory effort. no wheeze, no rhonchi, no rales  Cardiovascular: S1/S2 normal, no murmur, no rub/gallop auscultated. RRR. No lower extremity edema.   Gastrointestinal: Nontender, no masses. No hepatomegaly, no splenomegaly. No hernia appreciated. Bowel sounds normal. Rectal exam deferred.   Musculoskeletal: Gait normal. No clubbing/cyanosis of digits.   Neurological: Normal balance/coordination. No tremor. No cranial nerve deficit on limited exam. Motor and sensation intact and symmetric. Cerebellar reflexes intact.   Skin: warm,  dry, intact. No rash/ulcer. No concerning nevi or subq nodules on limited exam.    Psychiatric: Normal judgment/insight. Normal mood and affect. Oriented x3.     ASSESSMENT/PLAN: The primary encounter diagnosis was Annual physical exam. Diagnoses of Alcoholic cirrhosis of liver without ascites ([place]) and Mixed hyperlipidemia were also pertinent to this visit.  Overall, doing well.  Would keep up the good work.  Would work on increasing exercise and trying to maintain healthy diet as much as possible.  Patient Instructions  General Preventive Care  Most recent routine screening lipids/other labs: already done  Tobacco: don't!   Alcohol: don't!   Exercise: as tolerated to reduce risk of cardiovascular disease and diabetes. Strength training will also prevent osteoporosis.   Mental health: if need for mental health care (medicines, counseling, other), or concerns about moods, please let me know!   Sexual health: if need for STD testing, or if  concerns with libido/pain problems, please let me know!   Advanced Directive: Living Will and/or Healthcare Power of Attorney recommended for all adults, regardless of age or health.  Vaccines  Flu vaccine: recommended for almost everyone, every fall.   Shingles vaccine: Shingrix recommended after age 19.   Pneumonia vaccines: up to date.   Tetanus booster: Tdap recommended every 10 years. Due 2026.  Cancer screenings   Colon cancer screening:per GI recommendations.   Prostate cancer screening: PSA normal.   Lung cancer screening: not needed for non-smokers  Infection screenings . HIV, Gonorrhea/Chlamydia: screening as needed . Hepatitis C: negative 2 years ago  . TB: certain at-risk populations, or depending on work requirements and/or travel history Other . Bone Density Test: recommended for men at age 61, sooner depending on risk factors  . Abdominal Aortic Aneurysm: screening with ultrasound recommended once for men age 47-75 who have ever smoked             Visit summary with medication list and pertinent instructions was printed for patient to review. All questions at time of visit were answered - patient instructed to contact office with any additional concerns or updates. ER/RTC precautions were reviewed with the patient.      Please note: voice recognition software was used to produce this document, and typos may escape review. Please contact Dr. Sheppard Coil for any needed clarifications.     Follow-up plan: Return in about 6 months (around 08/14/2019) for follow-up labs unles seeing GI. Annual w/ Dr Sheppard Coil in one year, see me sooner if needed! Marland Kitchen

## 2019-02-19 ENCOUNTER — Other Ambulatory Visit: Payer: Self-pay | Admitting: Osteopathic Medicine

## 2019-02-19 DIAGNOSIS — M109 Gout, unspecified: Secondary | ICD-10-CM

## 2019-04-08 ENCOUNTER — Other Ambulatory Visit: Payer: Self-pay | Admitting: Osteopathic Medicine

## 2019-04-08 DIAGNOSIS — G4700 Insomnia, unspecified: Secondary | ICD-10-CM

## 2019-04-09 NOTE — Telephone Encounter (Signed)
[facility] requesting med refill for zolpidem.

## 2019-04-19 ENCOUNTER — Other Ambulatory Visit: Payer: Self-pay | Admitting: Gastroenterology

## 2019-04-19 DIAGNOSIS — K7460 Unspecified cirrhosis of liver: Secondary | ICD-10-CM

## 2019-04-26 ENCOUNTER — Ambulatory Visit
Admission: RE | Admit: 2019-04-26 | Discharge: 2019-04-26 | Disposition: A | Discharge status: Home / Self Care | Payer: 59 | Source: Ambulatory Visit | Attending: Gastroenterology | Admitting: Gastroenterology

## 2019-04-26 DIAGNOSIS — K7460 Unspecified cirrhosis of liver: Secondary | ICD-10-CM

## 2019-05-15 ENCOUNTER — Other Ambulatory Visit: Payer: Self-pay

## 2019-05-15 MED ORDER — PODOFILOX 0.5 % EX SOLN: CUTANEOUS | 0 refills | Status: DC

## 2019-07-03 ENCOUNTER — Other Ambulatory Visit: Payer: Self-pay

## 2019-07-03 MED ORDER — PODOFILOX 0.5 % EX SOLN: CUTANEOUS | 0 refills | Status: DC

## 2019-07-03 MED ORDER — VARDENAFIL HCL 20 MG PO TABS: 10.0000 mg | ORAL_TABLET | Freq: Every day | ORAL | 5 refills | Status: DC | PRN

## 2019-07-04 ENCOUNTER — Telehealth: Payer: Self-pay | Admitting: Osteopathic Medicine

## 2019-07-04 MED ORDER — ATORVASTATIN CALCIUM 10 MG PO TABS: 10.0000 mg | ORAL_TABLET | Freq: Every day | ORAL | 3 refills | Status: DC

## 2019-07-04 NOTE — Telephone Encounter (Signed)
Patient calls and wants to be put on low dose cholesterol medication as you both discussed at last visit. KG LPN

## 2019-07-04 NOTE — Telephone Encounter (Signed)
The 10-year ASCVD risk score Mikey Bussing DC Brooke Bonito., et al., 2013) is: 7.4%   Values used to calculate the score:     Age: 54 years     Sex: Male     Is Non-Hispanic African American: No     Diabetic: No     Tobacco smoker: No     Systolic Blood Pressure: [id number] mmHg     Is BP treated: No     HDL Cholesterol: 40 mg/dL     Total Cholesterol: 222 mg/dL

## 2019-08-06 ENCOUNTER — Other Ambulatory Visit: Payer: Self-pay

## 2019-08-06 MED ORDER — PODOFILOX 0.5 % EX SOLN: CUTANEOUS | 0 refills | Status: DC

## 2019-08-14 ENCOUNTER — Ambulatory Visit: Payer: 59 | Admitting: Osteopathic Medicine

## 2019-09-06 DIAGNOSIS — K7030 Alcoholic cirrhosis of liver without ascites: Secondary | ICD-10-CM | POA: Insufficient documentation

## 2019-09-18 ENCOUNTER — Other Ambulatory Visit: Payer: Self-pay

## 2019-09-18 DIAGNOSIS — F411 Generalized anxiety disorder: Secondary | ICD-10-CM

## 2019-09-18 MED ORDER — BUSPIRONE HCL 15 MG PO TABS: 15.0000 mg | ORAL_TABLET | Freq: Two times a day (BID) | ORAL | 0 refills | Status: DC

## 2019-09-19 ENCOUNTER — Other Ambulatory Visit: Payer: Self-pay

## 2019-09-19 ENCOUNTER — Encounter: Payer: Self-pay | Admitting: Osteopathic Medicine

## 2019-09-19 ENCOUNTER — Ambulatory Visit (INDEPENDENT_AMBULATORY_CARE_PROVIDER_SITE_OTHER): Payer: 59 | Admitting: Osteopathic Medicine

## 2019-09-19 VITALS — BP 120/85 | HR 80 | Temp 97.9°F | Wt 199.0 lb

## 2019-09-19 DIAGNOSIS — K7030 Alcoholic cirrhosis of liver without ascites: Secondary | ICD-10-CM

## 2019-09-19 DIAGNOSIS — R4189 Other symptoms and signs involving cognitive functions and awareness: Secondary | ICD-10-CM

## 2019-09-19 DIAGNOSIS — M109 Gout, unspecified: Secondary | ICD-10-CM | POA: Diagnosis not present

## 2019-09-19 DIAGNOSIS — Z7189 Other specified counseling: Secondary | ICD-10-CM

## 2019-09-19 DIAGNOSIS — F488 Other specified nonpsychotic mental disorders: Secondary | ICD-10-CM

## 2019-09-19 LAB — URIC ACID: Uric Acid, Serum: 6.1 mg/dL (ref 4.0–8.0)

## 2019-09-19 NOTE — Patient Instructions (Addendum)
Tests in the hospital:  Troponin levels on blood work, this looks at heart muscle, if heart muscle is in the bloodstream such that we can find it in an elevated troponin level, this indicates some heart muscle damage/death.  These levels were normal, showing that her tissue was healthy.  EKG shows electrical activity of the heart, this can change in heart attack and other conditions.  Your EKGs were fine.  Stress test looks for evidence that the heart is struggling to get oxygen to the heart muscle tissue.  If a stress test is not showing signs of ischemia (lack of blood flow to the heart) this is very reassuring that there is no significant plaque buildup in the coronary arteries  Echocardiogram is an ultrasound that looks at how the heart is pumping.  Yours was pumping normally, structure appeared to be normal.   Cholesterol levels on blood work showed LDL/bad cholesterol was looking pretty good/low.  Ideally we would like to see HDL/good cholesterol a little bit higher, would work on diet/exercise.  Would also like to see triglycerides a bit lower.  Let us plan to recheck these levels in the next couple of months.  All of these components put together tell us that your heart is overall healthy and you are at very low risk for heart attack, which is reassuring!  See below for other information on heart disease prevention.        Heart Disease Prevention Heart disease is the leading cause of death in the world. Coronary artery disease is the most common cause of heart disease. This condition results when cholesterol and other substances (plaque) build up inside the walls of the blood vessels that supply your heart muscle (arteries). This buildup in arteries is called atherosclerosis. You can take actions to lower your risk of heart disease.  What can increase my risk? The following factors may make you more likely to develop this condition:  High blood pressure (hypertension).  High  cholesterol.  Smoking.  A diet high in saturated fats or trans fats.  Lack of physical activity.  Obesity.  Drinking too much alcohol.  Diabetes.  Having a family history of heart disease.  What actions can I take to prevent heart disease?  Nutrition  Eat a heart-healthy eating plan as told by your health care provider. Examples include the DASH (Dietary Approaches to Stop Hypertension) eating plan or the Mediterranean diet.  Generally, it is recommended that you: ? Eat less salt (sodium). Ask your health care provider how much sodium is safe for you. Most people should have less than 2,300 mg each day. ? Limit unhealthy fats, such as saturated and trans fats, in your diet. You can do this by eating low-fat dairy products, eating less red meat, and avoiding processed foods. ? Eat healthy fats (omega-3 fatty acids). These are found in fish, such as mackerel or salmon. ? Eat more fruits and vegetables. You should try to fill one-half of your plate with fruits and vegetables at each meal. ? Eat more whole grains. ? Avoid foods and drinks that have added sugars.  Lifestyle   Get regular exercise. This is one of the most important things you can do for your health. Generally, it is recommended that you: ? Exercise for at least 30 minutes on most days of the week (150 minutes each week). The exercise should increase your heart rate and make you sweat (aerobic exercise). ? Add strength exercises on at least 2 days each week.  Do not use any products that contain nicotine or tobacco, such as cigarettes and e-cigarettes. These can damage your heart and blood vessels. If you need help quitting, ask your health care provider.  Alcohol use  Do not drink alcohol if: ? Your health care provider tells you not to drink. ? You are pregnant, may be pregnant, or are planning to become pregnant.  If you drink alcohol, limit how much you have: ? 0-1 drink a day for women. ? 0-2 drinks a day  for men.  Be aware of how much alcohol is in your drink. In the [place] one drink equals one typical bottle of beer (12 oz), one-half glass of wine (5 oz), or one shot of hard liquor (1 oz).  Medicines  Take over-the-counter and prescription medicines only as told by your health care provider.  Ask your health care provider whether you should take an aspirin every day. Taking aspirin may help reduce your risk of heart disease and stroke.  Depending on your risk factors, your health care provider may prescribe medicines to lower your risk of heart disease or to control related conditions. You may take medicine to: ? Lower cholesterol. ? Control blood pressure. ? Control diabetes.  General information  Keep your blood pressure under control, as recommended by your health care provider. For most healthy people, the upper number of your blood pressure (systolic) should be no higher than 120, and the lower number (diastolic) no higher than 80. Treatment may be needed if your blood pressure is higher than 130/80.  Have your blood pressure checked at least every two years. Your health care provider may check your blood pressure more often if you have high blood pressure.  After age 66, have your cholesterol checked every 4-6 years. If you have risk factors for heart disease, you may need to have it checked more frequently. Treatment may be needed if your cholesterol is high.  Have your body mass index (BMI) checked every year. Your health care provider can calculate your BMI from your height and weight.  Work with your health care provider to lose weight, if needed, or to maintain a healthy weight.  Where to find more information:  Centers for Disease Control and Prevention: TextNotebook.fi  American Heart Association: www.heart.org ? Take a free online heart disease risk quiz to better understand your personal risk factors. Summary  Heart disease is the leading cause of death in  the world.  Heart disease can cause chest pain, abnormal heart rhythms, heart attack, and heart failure.  High blood pressure, high cholesterol, and smoking are the main risk factors for heart disease, although other factors also contribute.  You can take actions to lower your chances of developing heart disease. Work with your health care provider to reduce your risk by following a heart-healthy diet, being physically active, and controlling your weight, blood pressure, and cholesterol level.   This information is not intended to replace advice given to you by your health care provider. Make sure you discuss any questions you have with your health care provider. Document Revised: 06/21/2017 Document Reviewed: 06/21/2017 Elsevier Patient Education  2020 [employer].

## 2019-09-19 NOTE — Progress Notes (Signed)
[name] Brown is a 54 y.o. male who presents to  [place] at [facility]  today, 09/19/19, seeking care for the following: . ER follow-up, chest pain      ASSESSMENT & PLAN with other pertinent history/findings:  The primary encounter diagnosis was Cardiac risk counseling. Diagnoses of Alcoholic cirrhosis of liver without ascites ([place]), Brain fog, and Gout, unspecified cause, unspecified chronicity, unspecified site were also pertinent to this visit.  See below for review of recent hospital records.  ACS ruled out, concern for anxiety  Today, 09/19/2019, patient reports chest pain has resolved, no subsequent episodes.  He is a bit concerned after his hospital discharge but results were not very well explained to him, has some questions about tests that were done.  See patient instructions below.  Hospital records reviewed 09/05/2019: Chief complaint chest pain x1 hour, palpitations, "felt off mentally" about an hour.  Symptoms resolved in the ED with aspirin and Nitropaste. Kept for observation.  Discharge summary reviewed: Dx chest pain with acute coronary syndrome ruled out negative troponins and EKG and no ischemia on stress test, TTE demonstrated normal cardiac structure/function.  Other labs: TC 154, HDL 31, LDL 86, CBC WNL, CMP WNL including glucose, creatinine.  Concern for chest pain due to anxiety.  The 10-year ASCVD risk score Mikey Bussing DC Jr., et al., 2013) is: 5.2%   Values used to calculate the score:     Age: 89 years     Sex: Male     Is Non-Hispanic African American: No     Diabetic: No     Tobacco smoker: No     Systolic Blood Pressure: 123456 mmHg     Is BP treated: No     HDL Cholesterol: 31 mg/dL     Total Cholesterol: 154 mg/dL    Patient Instructions  Tests in the hospital:  Troponin levels on blood work, this looks at heart muscle, if heart muscle is in the bloodstream such that we can find it in an elevated troponin  level, this indicates some heart muscle damage/death.  These levels were normal, showing that her tissue was healthy.  EKG shows electrical activity of the heart, this can change in heart attack and other conditions.  Your EKGs were fine.  Stress test looks for evidence that the heart is struggling to get oxygen to the heart muscle tissue.  If a stress test is not showing signs of ischemia (lack of blood flow to the heart) this is very reassuring that there is no significant plaque buildup in the coronary arteries  Echocardiogram is an ultrasound that looks at how the heart is pumping.  Yours was pumping normally, structure appeared to be normal.   Cholesterol levels on blood work showed LDL/bad cholesterol was looking pretty good/low.  Ideally we would like to see HDL/good cholesterol a little bit higher, would work on diet/exercise.  Would also like to see triglycerides a bit lower.  Let us plan to recheck these levels in the next couple of months.  All of these components put together tell us that your heart is overall healthy and you are at very low risk for heart attack, which is reassuring!  See below for other information on heart disease prevention.        Heart Disease Prevention Heart disease is the leading cause of death in the world. Coronary artery disease is the most common cause of heart disease. This condition results when cholesterol and other  substances (plaque) build up inside the walls of the blood vessels that supply your heart muscle (arteries). This buildup in arteries is called atherosclerosis. You can take actions to lower your risk of heart disease.  What can increase my risk? The following factors may make you more likely to develop this condition:  High blood pressure (hypertension).  High cholesterol.  Smoking.  A diet high in saturated fats or trans fats.  Lack of physical activity.  Obesity.  Drinking too much alcohol.  Diabetes.  Having a family  history of heart disease.  What actions can I take to prevent heart disease?  Nutrition  Eat a heart-healthy eating plan as told by your health care provider. Examples include the DASH (Dietary Approaches to Stop Hypertension) eating plan or the Mediterranean diet.  Generally, it is recommended that you: ? Eat less salt (sodium). Ask your health care provider how much sodium is safe for you. Most people should have less than 2,300 mg each day. ? Limit unhealthy fats, such as saturated and trans fats, in your diet. You can do this by eating low-fat dairy products, eating less red meat, and avoiding processed foods. ? Eat healthy fats (omega-3 fatty acids). These are found in fish, such as mackerel or salmon. ? Eat more fruits and vegetables. You should try to fill one-half of your plate with fruits and vegetables at each meal. ? Eat more whole grains. ? Avoid foods and drinks that have added sugars.  Lifestyle   Get regular exercise. This is one of the most important things you can do for your health. Generally, it is recommended that you: ? Exercise for at least 30 minutes on most days of the week (150 minutes each week). The exercise should increase your heart rate and make you sweat (aerobic exercise). ? Add strength exercises on at least 2 days each week.  Do not use any products that contain nicotine or tobacco, such as cigarettes and e-cigarettes. These can damage your heart and blood vessels. If you need help quitting, ask your health care provider.  Alcohol use  Do not drink alcohol if: ? Your health care provider tells you not to drink. ? You are pregnant, may be pregnant, or are planning to become pregnant.  If you drink alcohol, limit how much you have: ? 0-1 drink a day for women. ? 0-2 drinks a day for men.  Be aware of how much alcohol is in your drink. In the [place] one drink equals one typical bottle of beer (12 oz), one-half glass of wine (5 oz), or one shot of hard  liquor (1 oz).  Medicines  Take over-the-counter and prescription medicines only as told by your health care provider.  Ask your health care provider whether you should take an aspirin every day. Taking aspirin may help reduce your risk of heart disease and stroke.  Depending on your risk factors, your health care provider may prescribe medicines to lower your risk of heart disease or to control related conditions. You may take medicine to: ? Lower cholesterol. ? Control blood pressure. ? Control diabetes.  General information  Keep your blood pressure under control, as recommended by your health care provider. For most healthy people, the upper number of your blood pressure (systolic) should be no higher than 120, and the lower number (diastolic) no higher than 80. Treatment may be needed if your blood pressure is higher than 130/80.  Have your blood pressure checked at least every two years. Your  health care provider may check your blood pressure more often if you have high blood pressure.  After age 54, have your cholesterol checked every 4-6 years. If you have risk factors for heart disease, you may need to have it checked more frequently. Treatment may be needed if your cholesterol is high.  Have your body mass index (BMI) checked every year. Your health care provider can calculate your BMI from your height and weight.  Work with your health care provider to lose weight, if needed, or to maintain a healthy weight.  Where to find more information:  Centers for Disease Control and Prevention: TextNotebook.fi  American Heart Association: www.heart.org ? Take a free online heart disease risk quiz to better understand your personal risk factors. Summary  Heart disease is the leading cause of death in the world.  Heart disease can cause chest pain, abnormal heart rhythms, heart attack, and heart failure.  High blood pressure, high cholesterol, and smoking are the main  risk factors for heart disease, although other factors also contribute.  You can take actions to lower your chances of developing heart disease. Work with your health care provider to reduce your risk by following a heart-healthy diet, being physically active, and controlling your weight, blood pressure, and cholesterol level.   This information is not intended to replace advice given to you by your health care provider. Make sure you discuss any questions you have with your health care provider. Document Revised: 06/21/2017 Document Reviewed: 06/21/2017 Elsevier Patient Education  2020 [place] This Encounter  Procedures  . Ammonia  . TSH  . Uric acid    No orders of the defined types were placed in this encounter.      Follow-up instructions: Return in about 6 weeks (around 10/31/2019) for recheck labs (cholesterol) .                                         BP 120/85 (BP Location: Left Arm, Patient Position: Sitting, Cuff Size: Large)   Pulse 80   Temp 97.9 F (36.6 C) (Oral)   Wt 199 lb (90.3 kg)   BMI 31.17 kg/m   Current Meds  Medication Sig  . allopurinol (ZYLOPRIM) 300 MG tablet TAKE 1 TABLET BY MOUTH EVERY DAY  . atorvastatin (LIPITOR) 10 MG tablet Take 1 tablet (10 mg total) by mouth daily.  . busPIRone (BUSPAR) 15 MG tablet Take 1 tablet (15 mg total) by mouth 2 (two) times daily.  . finasteride (PROSCAR) 5 MG tablet Take 1 tablet (5 mg total) by mouth every evening.  . indomethacin (INDOCIN) 25 MG capsule Take 2 capsules (50 mg total) by mouth 3 (three) times daily as needed. For 1-3 days until gout flare subsides, then reduce dose to 1 capsule (25 mg total) twice daily as needed  . lactulose (CEPHULAC) 10 g packet Take 10 g by mouth 2 (two) times daily.  . Multiple Vitamin (MULTIVITAMIN WITH MINERALS) TABS tablet Take 1 tablet by mouth daily.  . Omega-3 Fatty Acids (FISH OIL) 1200 MG CAPS Take 1,200  mg by mouth daily.  Marland Kitchen omeprazole (PRILOSEC) 10 MG capsule Take 10 mg by mouth daily.  . podofilox (CONDYLOX) 0.5 % external solution Apply externally to the affected area twice daily.  . vardenafil (LEVITRA) 20 MG tablet Take 0.5-1 tablets (10-20 mg total) by mouth daily as  needed for erectile dysfunction.  Marland Kitchen XIFAXAN 550 MG TABS tablet Take 550 mg by mouth 2 (two) times daily.  Marland Kitchen zolpidem (AMBIEN) 5 MG tablet TAKE 1/2 TO 1 TABLET BY MOUTH AT BEDTIME AS NEEDED FOR SLEEP    No results found for this or any previous visit (from the past 72 hour(s)).  No results found.  Depression screen [facility] 2/9 09/19/2019 02/11/2019 08/09/2018  Decreased Interest 1 1 1   Down, Depressed, Hopeless 1 1 1   PHQ - 2 Score 2 2 2   Altered sleeping 2 1 1   Tired, decreased energy 2 2 2   Change in appetite 2 2 0  Feeling bad or failure about yourself  2 1 1   Trouble concentrating 3 2 1   Moving slowly or fidgety/restless 1 0 0  Suicidal thoughts 0 0 0  PHQ-9 Score 14 10 7   Difficult doing work/chores Somewhat difficult Somewhat difficult Somewhat difficult    GAD 7 : Generalized Anxiety Score 09/19/2019 02/11/2019 08/09/2018 11/09/2017  Nervous, Anxious, on Edge 2 1 1 1   Control/stop worrying 2 1 0 1  Worry too much - different things 2 1 0 0  Trouble relaxing 2 1 1 1   Restless 0 0 0 1  Easily annoyed or irritable 2 1 1 1   Afraid - awful might happen 0 0 1 0  Total GAD 7 Score 10 5 4 5   Anxiety Difficulty Somewhat difficult Not difficult at all Not difficult at all Not difficult at all      All questions at time of visit were answered - patient instructed to contact office with any additional concerns or updates.  ER/RTC precautions were reviewed with the patient.  Please note: voice recognition software was used to produce this document, and typos may escape review. Please contact Dr. Sheppard Coil for any needed clarifications.   Total encounter time: 40 minutes.

## 2019-09-20 LAB — AMMONIA: Ammonia: 39 umol/L (ref <=72)

## 2019-09-20 LAB — TSH: TSH: 1.06 mIU/L (ref 0.40–4.50)

## 2019-10-03 ENCOUNTER — Telehealth: Payer: Self-pay

## 2019-10-03 ENCOUNTER — Other Ambulatory Visit: Payer: Self-pay

## 2019-10-03 MED ORDER — PODOFILOX 0.5 % EX SOLN: CUTANEOUS | 0 refills | Status: DC

## 2019-10-04 MED ORDER — PODOFILOX 0.5 % EX SOLN: CUTANEOUS | 0 refills | Status: DC

## 2019-10-04 NOTE — Addendum Note (Signed)
Addended by: Maryla Morrow on: 10/04/2019 07:49 AM   Modules accepted: Orders

## 2019-10-04 NOTE — Telephone Encounter (Signed)
sebnt

## 2019-10-29 ENCOUNTER — Other Ambulatory Visit: Payer: Self-pay | Admitting: Osteopathic Medicine

## 2019-10-30 ENCOUNTER — Ambulatory Visit (INDEPENDENT_AMBULATORY_CARE_PROVIDER_SITE_OTHER): Payer: 59 | Admitting: Osteopathic Medicine

## 2019-10-30 ENCOUNTER — Other Ambulatory Visit: Payer: Self-pay

## 2019-10-30 DIAGNOSIS — E782 Mixed hyperlipidemia: Secondary | ICD-10-CM

## 2019-10-30 DIAGNOSIS — K7030 Alcoholic cirrhosis of liver without ascites: Secondary | ICD-10-CM

## 2019-10-31 NOTE — Progress Notes (Signed)
appt in error - he just needs labs  no charge

## 2019-11-12 ENCOUNTER — Other Ambulatory Visit: Payer: Self-pay

## 2019-11-12 DIAGNOSIS — F411 Generalized anxiety disorder: Secondary | ICD-10-CM

## 2019-11-12 MED ORDER — BUSPIRONE HCL 15 MG PO TABS: 15.0000 mg | ORAL_TABLET | Freq: Two times a day (BID) | ORAL | 2 refills | Status: DC

## 2019-12-16 ENCOUNTER — Other Ambulatory Visit: Payer: Self-pay

## 2019-12-16 DIAGNOSIS — M109 Gout, unspecified: Secondary | ICD-10-CM

## 2019-12-16 MED ORDER — PODOFILOX 0.5 % EX SOLN: CUTANEOUS | 0 refills | Status: DC

## 2020-02-06 LAB — COMPLETE METABOLIC PANEL WITH GFR
AG Ratio: 1.5 (calc) (ref 1.0–2.5)
ALT: 43 U/L (ref 9–46)
AST: 43 U/L — ABNORMAL HIGH (ref 10–35)
Albumin: 4.6 g/dL (ref 3.6–5.1)
Alkaline phosphatase (APISO): 60 U/L (ref 35–144)
BUN: 11 mg/dL (ref 7–25)
CO2: 28 mmol/L (ref 20–32)
Calcium: 9 mg/dL (ref 8.6–10.3)
Chloride: 103 mmol/L (ref 98–110)
Creat: 0.7 mg/dL (ref 0.70–1.33)
GFR, Est African American: 123 mL/min/{1.73_m2} (ref >=60)
GFR, Est Non African American: 106 mL/min/{1.73_m2} (ref >=60)
Globulin: 3 g/dL (calc) (ref 1.9–3.7)
Glucose, Bld: 99 mg/dL (ref 65–99)
Potassium: 4 mmol/L (ref 3.5–5.3)
Sodium: 139 mmol/L (ref 135–146)
Total Bilirubin: 0.6 mg/dL (ref 0.2–1.2)
Total Protein: 7.6 g/dL (ref 6.1–8.1)

## 2020-02-06 LAB — LIPID PANEL
Cholesterol: 148 mg/dL (ref <200)
HDL: 38 mg/dL — ABNORMAL LOW (ref >=40)
LDL Cholesterol (Calc): 90 mg/dL (calc)
Non-HDL Cholesterol (Calc): 110 mg/dL (calc) (ref <130)
Total CHOL/HDL Ratio: 3.9 (calc) (ref <5.0)
Triglycerides: 102 mg/dL (ref <150)

## 2020-02-07 ENCOUNTER — Other Ambulatory Visit: Payer: Self-pay | Admitting: Osteopathic Medicine

## 2020-02-07 DIAGNOSIS — F411 Generalized anxiety disorder: Secondary | ICD-10-CM

## 2020-02-19 ENCOUNTER — Ambulatory Visit (INDEPENDENT_AMBULATORY_CARE_PROVIDER_SITE_OTHER): Payer: 59 | Admitting: Osteopathic Medicine

## 2020-02-19 ENCOUNTER — Encounter: Payer: Self-pay | Admitting: Osteopathic Medicine

## 2020-02-19 VITALS — BP 121/76 | HR 79 | Wt 199.0 lb

## 2020-02-19 DIAGNOSIS — Z0000 Encounter for general adult medical examination without abnormal findings: Secondary | ICD-10-CM

## 2020-02-19 DIAGNOSIS — Z23 Encounter for immunization: Secondary | ICD-10-CM

## 2020-02-19 DIAGNOSIS — F411 Generalized anxiety disorder: Secondary | ICD-10-CM | POA: Diagnosis not present

## 2020-02-19 MED ORDER — ESCITALOPRAM OXALATE 10 MG PO TABS
ORAL_TABLET | ORAL | 0 refills | Status: DC
Start: 2020-02-19 — End: 2020-05-19

## 2020-02-19 NOTE — Patient Instructions (Addendum)
General Preventive Care  Most recent routine screening labs: no concerns.   Blood pressure goal 130/80 or less.   Tobacco: don't!   Alcohol: avoid/abstain given your history!   Exercise: as tolerated to reduce risk of cardiovascular disease and diabetes. Strength training will also prevent osteoporosis.   Mental health: if need for mental health care, please let me know!   Sexual / Reproductive health: if need for STD testing, or if concerns with libido/pain problems, please let me know!   Advanced Directive: Living Will and/or Healthcare Power of Attorney recommended for all adults, regardless of age or health.  Vaccines  Flu vaccine: every fall.   Shingles vaccine: after age 75  Pneumonia vaccines: after age 36  Tetanus booster: every 10 years - due 2026  COVID vaccine: THANKS for getting your vaccine! :)  Cancer screenings   Colon cancer screening: for everyone age 30-75. Colonoscopy due 03/2020, please contact your GI office   Prostate cancer screening: PSA blood test age 66-71  Lung cancer screening: not needed for non-smokers  Infection screenings  . HIV: recommended screening at least once age 62-65 . Gonorrhea/Chlamydia: screening as needed . Hepatitis C: recommended once for everyone age 25-75 . TB: certain at-risk populations Other . Bone Density Test: recommended for men at age 23 . Abdominal Aortic Aneurysm: screening with ultrasound recommended once for men age 68-75 who have ever smoked  .

## 2020-02-19 NOTE — Progress Notes (Signed)
Pt advised to continue to take Buspar in addition to Lexapro.

## 2020-02-19 NOTE — Progress Notes (Signed)
[name] Brown is a 54 y.o. male who presents to  [place] at [facility]  today, 02/19/20, seeking care for the following:  . Annual physical  . Brain fog after COVID, confounded by other issues w/ hepatic encephalopathy which was well controlled then brain fog est in pt not sure if related, is causing significant mental distress and anxiety, felt improved on vacation but resumed w/ return to "normal" life      ASSESSMENT & PLAN with other pertinent findings:  The primary encounter diagnosis was Annual physical exam. Diagnoses of Need for Zostavax administration and Generalized anxiety disorder were also pertinent to this visit.   Brain fog likely not related to hepatic issues Can trial restarting Lexapro, was on this w/ previous PCP, anxiety / depression likely complicating mental & cognitive health situation    Patient Instructions  General Preventive Care  Most recent routine screening labs: no concerns.   Blood pressure goal 130/80 or less.   Tobacco: don't!   Alcohol: avoid/abstain given your history!   Exercise: as tolerated to reduce risk of cardiovascular disease and diabetes. Strength training will also prevent osteoporosis.   Mental health: if need for mental health care, please let me know!   Sexual / Reproductive health: if need for STD testing, or if concerns with libido/pain problems, please let me know!   Advanced Directive: Living Will and/or Healthcare Power of Attorney recommended for all adults, regardless of age or health.  Vaccines  Flu vaccine: every fall.   Shingles vaccine: after age 70  Pneumonia vaccines: after age 46  Tetanus booster: every 10 years - due 2026  COVID vaccine: THANKS for getting your vaccine! :)  Cancer screenings   Colon cancer screening: for everyone age 14-75. Colonoscopy due 03/2020, please contact your GI office   Prostate cancer screening: PSA blood test age  66-71  Lung cancer screening: not needed for non-smokers  Infection screenings  . HIV: recommended screening at least once age 60-65 . Gonorrhea/Chlamydia: screening as needed . Hepatitis C: recommended once for everyone age 24-75 . TB: certain at-risk populations Other . Bone Density Test: recommended for men at age 5 . Abdominal Aortic Aneurysm: screening with ultrasound recommended once for men age 71-75 who have ever smoked  .    Orders Placed This Encounter  Procedures  . Varicella-zoster vaccine IM (Shingrix)    Meds ordered this encounter  Medications  . escitalopram (LEXAPRO) 10 MG tablet    Sig: Take 0.5 tablets (5 mg total) by mouth daily for 14 days, THEN 1 tablet (10 mg total) daily.    Dispense:  90 tablet    Refill:  0   Constitutional:  . VSS, see nurse notes . General Appearance: alert, well-developed, well-nourished, NAD Eyes: Marland Kitchen Normal lids and conjunctive, non-icteric sclera . PERRLA Ears, Nose, Mouth, Throat: . Normal appearance . Normal external auditory canal and TM bilaterally Neck: . No masses, trachea midline . No thyroid enlargement/tenderness/mass appreciated Respiratory: . Normal respiratory effort . Breath sounds normal, no wheeze/rhonchi/rales Cardiovascular: . S1/S2 normal, no murmur/rub/gallop auscultated . No lower extremity edema Gastrointestinal: . Nontender, no masses . No hepatomegaly, no splenomegaly . No hernia appreciated Musculoskeletal:  . Gait normal . No clubbing/cyanosis of digits Neurological: . No cranial nerve deficit on limited exam . Motor and sensation intact and symmetric Psychiatric: . Normal judgment/insight . Normal mood and affect    Follow-up instructions: Return in about 1 year (around 02/18/2021) for [place] (  call week prior to visit for lab orders).                                         BP 121/76   Pulse 79   Wt 199 lb (90.3 kg)   SpO2 96%   BMI 31.17  kg/m   Current Meds  Medication Sig  . allopurinol (ZYLOPRIM) 300 MG tablet TAKE 1 TABLET BY MOUTH EVERY DAY  . atorvastatin (LIPITOR) 10 MG tablet Take 1 tablet (10 mg total) by mouth daily.  . busPIRone (BUSPAR) 15 MG tablet TAKE 1 TABLET(15 MG) BY MOUTH TWICE DAILY  . finasteride (PROSCAR) 5 MG tablet Take 1 tablet (5 mg total) by mouth every evening.  . indomethacin (INDOCIN) 25 MG capsule Take 2 capsules (50 mg total) by mouth 3 (three) times daily as needed. For 1-3 days until gout flare subsides, then reduce dose to 1 capsule (25 mg total) twice daily as needed  . lactulose (CEPHULAC) 10 g packet Take 10 g by mouth 2 (two) times daily.  . Multiple Vitamin (MULTIVITAMIN WITH MINERALS) TABS tablet Take 1 tablet by mouth daily.  . podofilox (CONDYLOX) 0.5 % external solution Apply externally to the affected area twice daily.  . vardenafil (LEVITRA) 20 MG tablet Take 0.5-1 tablets (10-20 mg total) by mouth daily as needed for erectile dysfunction.  Marland Kitchen XIFAXAN 550 MG TABS tablet Take 550 mg by mouth 2 (two) times daily.  Marland Kitchen zolpidem (AMBIEN) 5 MG tablet TAKE 1/2 TO 1 TABLET BY MOUTH AT BEDTIME AS NEEDED FOR SLEEP    No results found for this or any previous visit (from the past 72 hour(s)).  No results found.     All questions at time of visit were answered - patient instructed to contact office with any additional concerns or updates.  ER/RTC precautions were reviewed with the patient as applicable.   Please note: voice recognition software was used to produce this document, and typos may escape review. Please contact Dr. Sheppard Coil for any needed clarifications.

## 2020-02-24 ENCOUNTER — Other Ambulatory Visit: Payer: Self-pay | Admitting: Osteopathic Medicine

## 2020-02-24 DIAGNOSIS — G4700 Insomnia, unspecified: Secondary | ICD-10-CM

## 2020-02-25 NOTE — Telephone Encounter (Signed)
Last refill- 09/07/2019 Last ov- 02/19/2020

## 2020-03-24 ENCOUNTER — Other Ambulatory Visit: Payer: Self-pay | Admitting: Osteopathic Medicine

## 2020-03-24 DIAGNOSIS — M109 Gout, unspecified: Secondary | ICD-10-CM

## 2020-04-01 ENCOUNTER — Ambulatory Visit: Payer: 59 | Admitting: Osteopathic Medicine

## 2020-04-01 ENCOUNTER — Encounter: Payer: Self-pay | Admitting: Osteopathic Medicine

## 2020-04-01 ENCOUNTER — Other Ambulatory Visit: Payer: Self-pay

## 2020-04-01 ENCOUNTER — Ambulatory Visit (INDEPENDENT_AMBULATORY_CARE_PROVIDER_SITE_OTHER): Payer: 59 | Admitting: Osteopathic Medicine

## 2020-04-01 VITALS — BP 119/83 | HR 73 | Wt 197.0 lb

## 2020-04-01 DIAGNOSIS — Z23 Encounter for immunization: Secondary | ICD-10-CM | POA: Diagnosis not present

## 2020-04-01 DIAGNOSIS — F411 Generalized anxiety disorder: Secondary | ICD-10-CM | POA: Diagnosis not present

## 2020-04-01 DIAGNOSIS — Z0000 Encounter for general adult medical examination without abnormal findings: Secondary | ICD-10-CM

## 2020-04-01 NOTE — Progress Notes (Addendum)
[name] Brown is a 54 y.o. male who presents to  [place] at [facility]  today, 04/01/20, seeking care for the following:  . Recheck mental health  . paresthesia in arms     ASSESSMENT & PLAN with other pertinent findings:  The primary encounter diagnosis was Anxiety state. A diagnosis of Annual physical exam was also pertinent to this visit.  Patient notes significant improvement after restarting the Lexapro, would like to continue on this medication for the time being.  PHQ/GAD show significant symptom improvement.  Paresthesia in arms, stable, low threshold for neruology referral, pt denies neck pain .   Labs ordered for future visit. Annual physical / preventive care was NOT performed or billed today.   No results found for this or any previous visit (from the past 24 hour(s)).    Immunization History  Administered Date(s) Administered  . Hepatitis A, Adult 04/01/2020  . Influenza,inj,Quad PF,6+ Mos 06/05/2015, 06/02/2016, 05/25/2017  . PFIZER SARS-COV-2 Vaccination 09/19/2019, 10/17/2019  . Pneumococcal Polysaccharide-23 08/13/2013  . Tdap 01/06/2015  . Zoster Recombinat (Shingrix) 02/19/2020    There are no Patient Instructions on file for this visit.  Orders Placed This Encounter  Procedures  . Hepatitis A vaccine adult IM    No orders of the defined types were placed in this encounter.      Follow-up instructions: Return for NURSE VISITS: Hep A #2 in 6 months, Shingrix #2 in 1-4 months .                                         BP 119/83 (BP Location: Left Arm, Patient Position: Sitting)   Pulse 73   Wt 197 lb (89.4 kg)   SpO2 96%   BMI 30.85 kg/m   Current Meds  Medication Sig  . allopurinol (ZYLOPRIM) 300 MG tablet TAKE 1 TABLET BY MOUTH EVERY DAY  . atorvastatin (LIPITOR) 10 MG tablet Take 1 tablet (10 mg total) by mouth daily.  . busPIRone (BUSPAR) 15 MG  tablet TAKE 1 TABLET(15 MG) BY MOUTH TWICE DAILY  . escitalopram (LEXAPRO) 10 MG tablet Take 0.5 tablets (5 mg total) by mouth daily for 14 days, THEN 1 tablet (10 mg total) daily.  . finasteride (PROSCAR) 5 MG tablet Take 1 tablet (5 mg total) by mouth every evening.  . indomethacin (INDOCIN) 25 MG capsule Take 2 capsules (50 mg total) by mouth 3 (three) times daily as needed. For 1-3 days until gout flare subsides, then reduce dose to 1 capsule (25 mg total) twice daily as needed  . lactulose (CEPHULAC) 10 g packet Take 10 g by mouth 2 (two) times daily.  . Multiple Vitamin (MULTIVITAMIN WITH MINERALS) TABS tablet Take 1 tablet by mouth daily.  . podofilox (CONDYLOX) 0.5 % external solution Apply externally to the affected area twice daily.  . sildenafil (VIAGRA) 100 MG tablet SMARTSIG:0.5 Tablet(s) By Mouth  . zolpidem (AMBIEN) 5 MG tablet TAKE 1/2 TO 1 TABLET BY MOUTH AT BEDTIME AS NEEDED FOR SLEEP    No results found for this or any previous visit (from the past 72 hour(s)).  No results found.     All questions at time of visit were answered - patient instructed to contact office with any additional concerns or updates.  ER/RTC precautions were reviewed with the patient as applicable.   Please note: voice recognition  software was used to produce this document, and typos may escape review. Please contact Dr. Sheppard Coil for any needed clarifications.

## 2020-04-16 ENCOUNTER — Other Ambulatory Visit: Payer: Self-pay | Admitting: Osteopathic Medicine

## 2020-05-04 ENCOUNTER — Ambulatory Visit (INDEPENDENT_AMBULATORY_CARE_PROVIDER_SITE_OTHER): Payer: 59 | Admitting: Osteopathic Medicine

## 2020-05-04 VITALS — Temp 97.4°F

## 2020-05-04 DIAGNOSIS — Z23 Encounter for immunization: Secondary | ICD-10-CM | POA: Diagnosis not present

## 2020-05-04 NOTE — Progress Notes (Signed)
Patient presents to office for second shingrix vaccine.   Patient tolerated injection well in right deltoid without any immediate complications.

## 2020-05-08 ENCOUNTER — Other Ambulatory Visit: Payer: Self-pay | Admitting: Osteopathic Medicine

## 2020-05-19 ENCOUNTER — Other Ambulatory Visit: Payer: Self-pay | Admitting: Osteopathic Medicine

## 2020-05-22 ENCOUNTER — Other Ambulatory Visit: Payer: Self-pay | Admitting: Osteopathic Medicine

## 2020-06-11 ENCOUNTER — Other Ambulatory Visit: Payer: Self-pay

## 2020-06-11 DIAGNOSIS — F411 Generalized anxiety disorder: Secondary | ICD-10-CM

## 2020-06-11 MED ORDER — BUSPIRONE HCL 15 MG PO TABS: ORAL_TABLET | ORAL | 3 refills | Status: DC

## 2020-06-22 ENCOUNTER — Other Ambulatory Visit: Payer: Self-pay | Admitting: Osteopathic Medicine

## 2020-06-22 DIAGNOSIS — G4700 Insomnia, unspecified: Secondary | ICD-10-CM

## 2020-06-24 ENCOUNTER — Other Ambulatory Visit: Payer: Self-pay | Admitting: Osteopathic Medicine

## 2020-06-24 NOTE — Telephone Encounter (Signed)
Last written 02/26/2020 #30 no refills  Last appt 04/01/2020  Lyn [place] pt

## 2020-07-30 ENCOUNTER — Other Ambulatory Visit: Payer: Self-pay | Admitting: Gastroenterology

## 2020-07-30 ENCOUNTER — Encounter: Payer: Self-pay | Admitting: Gastroenterology

## 2020-07-30 DIAGNOSIS — K7460 Unspecified cirrhosis of liver: Secondary | ICD-10-CM

## 2020-08-19 ENCOUNTER — Other Ambulatory Visit: Payer: Self-pay

## 2020-08-19 ENCOUNTER — Ambulatory Visit
Admission: RE | Admit: 2020-08-19 | Discharge: 2020-08-19 | Disposition: A | Discharge status: Home / Self Care | Payer: 59 | Source: Ambulatory Visit | Attending: Gastroenterology | Admitting: Gastroenterology

## 2020-08-19 DIAGNOSIS — K7460 Unspecified cirrhosis of liver: Secondary | ICD-10-CM

## 2020-08-19 MED ORDER — IOPAMIDOL (ISOVUE-300) INJECTION 61%
100.0000 mL | Freq: Once | INTRAVENOUS | Status: AC | PRN
  Administered 2020-08-19: 100 mL via INTRAVENOUS

## 2020-09-30 ENCOUNTER — Other Ambulatory Visit: Payer: Self-pay

## 2020-09-30 ENCOUNTER — Ambulatory Visit (INDEPENDENT_AMBULATORY_CARE_PROVIDER_SITE_OTHER): Payer: 59 | Admitting: Osteopathic Medicine

## 2020-09-30 VITALS — BP 116/75 | HR 71 | Temp 98.8°F | Resp 20 | Ht 67.0 in | Wt 194.0 lb

## 2020-09-30 DIAGNOSIS — Z23 Encounter for immunization: Secondary | ICD-10-CM

## 2020-09-30 MED ORDER — HEPATITIS A VACCINE 1440 EL U/ML IM SUSP
1.0000 mL | Freq: Once | INTRAMUSCULAR | Status: AC
  Administered 2020-09-30: 1440 [IU] via INTRAMUSCULAR

## 2020-09-30 NOTE — Progress Notes (Signed)
Established Patient Office Visit  Subjective:  Patient ID: [name] Brown, male    DOB: 1965-07-08  Age: 55 y.o. MRN: [id number]  CC:  Chief Complaint  Patient presents with  . Immunizations    HPI [name] Brown presents for his second Hepatitis A vaccine. Injection tolerated well, given in left deltoid without any apparent complications.   Past Medical History:  Diagnosis Date  . Alcohol abuse   . Allergy    seasonal but not treated   . Anxiety   . Depression   . Enlarged prostate   . Gout   . History of hematuria   . Hyperlipidemia   . Nocturia   . Right adrenal mass [facility])     Past Surgical History:  Procedure Laterality Date  . EYE SURGERY  12-13 yrs ago   lasix eye surgery  . INCISIONAL HERNIA REPAIR N/A 02/02/2016   Procedure: LAPAROSCOPIC INCISIONAL HERNIA;  Surgeon: Jackolyn Confer, MD;  Location: [facility];  Service: General;  Laterality: N/A;  . INSERTION OF MESH N/A 02/02/2016   Procedure: INSERTION OF MESH;  Surgeon: Jackolyn Confer, MD;  Location: [facility];  Service: General;  Laterality: N/A;  . KNEE SURGERY Left 1989   arthroscopy  . nerve cautery     jaw bone   . ROBOTIC ADRENALECTOMY Right 08/05/2015   Procedure: XI ROBOTIC RIGHT ADRENALECTOMY ;  Surgeon: Alexis Frock, MD;  Location: [facility];  Service: Urology;  Laterality: Right;    Family History  Problem Relation Age of Onset  . Hyperlipidemia Father   . Heart failure Father   . Alcohol abuse Maternal Grandfather   . Colon cancer Neg Hx   . Stomach cancer Neg Hx   . Rectal cancer Neg Hx   . Colon polyps Neg Hx     Social History   Socioeconomic History  . Marital status: Single    Spouse name: Not on file  . Number of children: Not on file  . Years of education: Not on file  . Highest education level: Not on file  Occupational History  . Not on file  Tobacco Use  . Smoking status: Never Smoker  . Smokeless tobacco: Never Used  Substance and Sexual Activity  . Alcohol use: Yes     Alcohol/week: 75.0 standard drinks    Types: 60 Cans of beer, 15 Shots of liquor per week    Comment: 6-7- drinks per day  . Drug use: Yes    Types: Marijuana    Comment: couple of times a month  . Sexual activity: Yes    Partners: Female  Other Topics Concern  . Not on file  Social History Narrative  . Not on file   Social Determinants of Health   Financial Resource Strain: Not on file  Food Insecurity: Not on file  Transportation Needs: Not on file  Physical Activity: Not on file  Stress: Not on file  Social Connections: Not on file  Intimate Partner Violence: Not on file    Outpatient Medications Prior to Visit  Medication Sig Dispense Refill  . allopurinol (ZYLOPRIM) 300 MG tablet TAKE 1 TABLET BY MOUTH EVERY DAY 90 tablet 3  . atorvastatin (LIPITOR) 10 MG tablet TAKE 1 TABLET(10 MG) BY MOUTH DAILY 90 tablet 1  . busPIRone (BUSPAR) 15 MG tablet TAKE 1 TABLET(15 MG) BY MOUTH TWICE DAILY 60 tablet 3  . escitalopram (LEXAPRO) 10 MG tablet TAKE 1 TABLET(10 MG) BY MOUTH DAILY 90 tablet 3  . finasteride (  PROSCAR) 5 MG tablet Take 1 tablet (5 mg total) by mouth every evening. 90 tablet 3  . indomethacin (INDOCIN) 25 MG capsule Take 2 capsules (50 mg total) by mouth 3 (three) times daily as needed. For 1-3 days until gout flare subsides, then reduce dose to 1 capsule (25 mg total) twice daily as needed 30 capsule 1  . lactulose (CEPHULAC) 10 g packet Take 10 g by mouth 2 (two) times daily.    . Multiple Vitamin (MULTIVITAMIN WITH MINERALS) TABS tablet Take 1 tablet by mouth daily.    . podofilox (CONDYLOX) 0.5 % external solution APPLY TOPICALLY TO THE AFFECTED AREA TWICE DAILY 3.5 mL 0  . sildenafil (VIAGRA) 100 MG tablet SMARTSIG:0.5 Tablet(s) By Mouth    . vardenafil (LEVITRA) 20 MG tablet Take 0.5-1 tablets (10-20 mg total) by mouth daily as needed for erectile dysfunction. 10 tablet 5  . XIFAXAN 550 MG TABS tablet Take 550 mg by mouth 2 (two) times daily.     Marland Kitchen zolpidem  (AMBIEN) 5 MG tablet TAKE 1/2 TO 1 TABLET BY MOUTH AT BEDTIME AS NEEDED FOR SLEEP 30 tablet 0   No facility-administered medications prior to visit.    Allergies  Allergen Reactions  . Viibryd [Vilazodone Hcl]     Pt lost sense of taste     ROS Review of Systems    Objective:    Physical Exam  BP 116/75 (BP Location: Left Arm, Patient Position: Sitting, Cuff Size: Large)   Pulse 71   Temp 98.8 F (37.1 C) (Oral)   Resp 20   Ht 5\' 7"  (1.702 m)   Wt 194 lb (88 kg)   SpO2 99%   BMI 30.38 kg/m  Wt Readings from Last 3 Encounters:  09/30/20 194 lb (88 kg)  04/01/20 197 lb (89.4 kg)  02/19/20 199 lb (90.3 kg)     Health Maintenance Due  Topic Date Due  . HIV Screening  Never done  . COLONOSCOPY (Pts 45-95yrs Insurance coverage will need to be confirmed)  04/16/2020  . COVID-19 Vaccine (3 - Booster for Pfizer series) 04/17/2020    There are no preventive care reminders to display for this patient.  Lab Results  Component Value Date   TSH 1.06 09/19/2019   Lab Results  Component Value Date   WBC 5.1 02/06/2019   HGB 16.2 02/06/2019   HCT 48.0 02/06/2019   MCV 90.7 02/06/2019   PLT 182 02/06/2019   Lab Results  Component Value Date   NA 139 02/06/2020   K 4.0 02/06/2020   CO2 28 02/06/2020   GLUCOSE 99 02/06/2020   BUN 11 02/06/2020   CREATININE 0.70 02/06/2020   BILITOT 0.6 02/06/2020   ALKPHOS 68 11/10/2016   AST 43 (H) 02/06/2020   ALT 43 02/06/2020   PROT 7.6 02/06/2020   ALBUMIN 4.8 11/10/2016   CALCIUM 9.0 02/06/2020   ANIONGAP 11 01/27/2016   Lab Results  Component Value Date   CHOL 148 02/06/2020   Lab Results  Component Value Date   HDL 38 (L) 02/06/2020   Lab Results  Component Value Date   LDLCALC 90 02/06/2020   Lab Results  Component Value Date   TRIG 102 02/06/2020   Lab Results  Component Value Date   CHOLHDL 3.9 02/06/2020   Lab Results  Component Value Date   HGBA1C 5.2 02/06/2019      Assessment & Plan:   Injection tolerated well, given in left deltoid without any apparent complications.  Problem List Items Addressed This Visit   None   Visit Diagnoses    Need for hepatitis A vaccination    -  Primary   Relevant Medications   hepatitis A virus (PF) vaccine (HAVRIX (PF)) injection 1,440 Units (Completed) (Start on 09/30/2020  4:00 PM)      Meds ordered this encounter  Medications  . hepatitis A virus (PF) vaccine (HAVRIX (PF)) injection 1,440 Units    Follow-up: No follow-ups on file.    Ninfa Meeker, CMA

## 2020-10-08 ENCOUNTER — Other Ambulatory Visit: Payer: Self-pay | Admitting: Osteopathic Medicine

## 2020-10-08 DIAGNOSIS — F411 Generalized anxiety disorder: Secondary | ICD-10-CM

## 2020-10-21 ENCOUNTER — Other Ambulatory Visit: Payer: Self-pay

## 2020-10-21 MED ORDER — ATORVASTATIN CALCIUM 10 MG PO TABS: ORAL_TABLET | ORAL | 1 refills | Status: DC

## 2020-10-29 ENCOUNTER — Other Ambulatory Visit: Payer: Self-pay | Admitting: Osteopathic Medicine

## 2020-11-05 ENCOUNTER — Other Ambulatory Visit: Payer: Self-pay | Admitting: Osteopathic Medicine

## 2020-11-06 ENCOUNTER — Telehealth: Payer: Self-pay

## 2020-11-06 DIAGNOSIS — R202 Paresthesia of skin: Secondary | ICD-10-CM

## 2020-11-06 NOTE — Telephone Encounter (Signed)
Pt called requesting a Neurologist referral. Per pt, numbness on his arms is getting worse. Pt is now having problems with sleeping because numbness increases during the night. Per pt, he mentioned the problem during his last visit with provider. Referral pended.

## 2020-11-10 NOTE — Telephone Encounter (Signed)
Per [name] - for Neurology referral, pt requires documentation of complaint regarding hands/arms numbness within the last 6 mths. Per pt, concern was addressed at last visit. Neurology okay with an addended visit note from provider. Pls advise, thanks.

## 2020-11-19 ENCOUNTER — Encounter: Payer: Self-pay | Admitting: Osteopathic Medicine

## 2020-11-19 NOTE — Telephone Encounter (Signed)
Note addended

## 2020-11-23 NOTE — Telephone Encounter (Signed)
I called [facility] and let them know that the numbness was discussed at last visit and the note had been addended to reflect. They will pull the note and get patient scheduled. - CF

## 2021-01-02 ENCOUNTER — Other Ambulatory Visit: Payer: Self-pay | Admitting: Osteopathic Medicine

## 2021-01-02 DIAGNOSIS — M109 Gout, unspecified: Secondary | ICD-10-CM

## 2021-02-15 ENCOUNTER — Telehealth: Payer: Self-pay | Admitting: General Practice

## 2021-02-15 NOTE — Telephone Encounter (Signed)
Transition Care Management Follow-up Telephone Call Date of discharge and from where: 02/14/21 from [facility] How have you been since you were released from the hospital? Doing better.  Any questions or concerns? No  Items Reviewed: Did the pt receive and understand the discharge instructions provided? Yes  Medications obtained and verified? Yes  Other? No  Any new allergies since your discharge? No  Dietary orders reviewed? Yes Do you have support at home? Yes   Home Care and Equipment/Supplies: Were home health services ordered? no   Functional Questionnaire: (I = Independent and D = Dependent) ADLs: I  Bathing/Dressing- I  Meal Prep- I  Eating- I  Maintaining continence- I  Transferring/Ambulation- I  Managing Meds- I  Follow up appointments reviewed:  PCP Hospital f/u appt confirmed? Yes  Scheduled to see Dr. Sheppard Coil on 02/17/21 @ 1520. [facility] f/u appt confirmed? No  Patient stated that he needs a new GI doctor. Are transportation arrangements needed? No  If their condition worsens, is the pt aware to call PCP or go to the Emergency Dept.? Yes Was the patient provided with contact information for the PCP's office or ED? Yes Was to pt encouraged to call back with questions or concerns? Yes

## 2021-02-16 ENCOUNTER — Encounter: Payer: Self-pay | Admitting: Neurology

## 2021-02-16 ENCOUNTER — Ambulatory Visit: Payer: 59 | Admitting: Neurology

## 2021-02-16 VITALS — BP 102/67 | HR 70 | Ht 67.0 in | Wt 183.0 lb

## 2021-02-16 DIAGNOSIS — R202 Paresthesia of skin: Secondary | ICD-10-CM | POA: Diagnosis not present

## 2021-02-16 MED ORDER — GABAPENTIN 100 MG PO CAPS: 100.0000 mg | ORAL_CAPSULE | Freq: Three times a day (TID) | ORAL | 11 refills | Status: DC

## 2021-02-16 NOTE — Progress Notes (Signed)
Chief Complaint  Patient presents with   New Patient (Initial Visit)    New rm, alone, Pt here to discuss numbness and tingling in right hand,states wore at night       ASSESSMENT [name] Brown is a 55 y.o. male   Bilateral hands paresthesia  Possible bilateral carpal tunnel syndromes  EMG nerve conduction study  Bilateral wrist splint   DIAGNOSTIC DATA (LABS, IMAGING, TESTING) - I reviewed patient records, labs, notes, testing and imaging myself where available. Laboratory evaluation on  February 13, 2021 from [facility]: CBC Hg15.7,  CMP Creat 0.7, LDL 76,    MEDICAL HISTORY:  [name] Brown is a 56 year old male, seen in request by her primary care doctor Emeterio Reeve for evaluation of bilateral hands paresthesia, initial evaluation was on February 16, 2021  I reviewed and summarized the referring note.  Past medical history Hyperlipidemia Depression, anxiety Alcohol abuse, last drink March 9th 2019.  Cirrhosis.  He had a history of heavy alcohol abuse, cirrhosis, last drink was in March 2019, also suffered depression anxiety, with significant improvement after starting Lexapro and BuSpar  Around 2019, he began to notice intermittent bilateral hands numbness tingling, getting worse since 2021, he works as a Therapist, sports, reported frequent daytime finger numbness extending above the elbow, right worse than left, denies weakness, also waking him up from sleep, he has to shake his hands, he denies bilateral lower extremity paresthesia  PHYSICAL EXAM:   Vitals:   02/16/21 1457  BP: 102/67  Pulse: 70  Weight: 183 lb (83 kg)  Height: '5\' 7"'$  (1.702 m)   Not recorded     Body mass index is 28.66 kg/m.  PHYSICAL EXAMNIATION:  Gen: NAD, conversant, well nourised, well groomed                     Cardiovascular: Regular rate rhythm, no peripheral edema, warm, nontender. Eyes: Conjunctivae clear without exudates or hemorrhage Neck:  Supple, no carotid bruits. Pulmonary: Clear to auscultation bilaterally   NEUROLOGICAL EXAM:  MENTAL STATUS: Speech:    Speech is normal; fluent and spontaneous with normal comprehension.  Cognition:     Orientation to time, place and person     Normal recent and remote memory     Normal Attention span and concentration     Normal Language, naming, repeating,spontaneous speech     Fund of knowledge   CRANIAL NERVES: CN II: Visual fields are full to confrontation. Pupils are round equal and briskly reactive to light. CN III, IV, VI: extraocular movement are normal. No ptosis. CN V: Facial sensation is intact to light touch CN VII: Face is symmetric with normal eye closure  CN VIII: Hearing is normal to causal conversation. CN IX, X: Phonation is normal. CN XI: Head turning and shoulder shrug are intact  MOTOR:Muscle bulk and tone are normal. Muscle strength is normal.  Bilateral wrist Tinel signs  REFLEXES: Reflexes are 2+ and symmetric at the biceps, triceps, knees, and ankles. Plantar responses are flexor.  SENSORY: Intact to light touch, pinprick and vibratory sensation are intact in fingers and toes.  COORDINATION: There is no trunk or limb dysmetria noted.  GAIT/STANCE: Posture is normal. Gait is steady with normal steps, base, arm swing, and turning. Heel and toe walking are normal. Tandem gait is normal.  Romberg is absent.  REVIEW OF SYSTEMS:  Full 14 system review of systems performed and notable only for as above All other  review of systems were negative.   ALLERGIES: Allergies  Allergen Reactions   Viibryd [Vilazodone Hcl]     Pt lost sense of taste     HOME MEDICATIONS: Current Outpatient Medications  Medication Sig Dispense Refill   allopurinol (ZYLOPRIM) 300 MG tablet TAKE 1 TABLET BY MOUTH EVERY DAY 90 tablet 0   atorvastatin (LIPITOR) 10 MG tablet TAKE 1 TABLET(10 MG) BY MOUTH DAILY 90 tablet 1   busPIRone (BUSPAR) 15 MG tablet TAKE 1 TABLET(15  MG) BY MOUTH TWICE DAILY 60 tablet 3   escitalopram (LEXAPRO) 10 MG tablet TAKE 1 TABLET(10 MG) BY MOUTH DAILY 90 tablet 3   finasteride (PROSCAR) 5 MG tablet Take 1 tablet (5 mg total) by mouth every evening. 90 tablet 3   indomethacin (INDOCIN) 25 MG capsule Take 2 capsules (50 mg total) by mouth 3 (three) times daily as needed. For 1-3 days until gout flare subsides, then reduce dose to 1 capsule (25 mg total) twice daily as needed 30 capsule 1   lactulose (CEPHULAC) 10 g packet Take 10 g by mouth 2 (two) times daily.     Multiple Vitamin (MULTIVITAMIN WITH MINERALS) TABS tablet Take 1 tablet by mouth daily.     podofilox (CONDYLOX) 0.5 % external solution APPLY TOPICALLY TO THE AFFECTED AREA TWICE DAILY 3.5 mL 0   sildenafil (VIAGRA) 100 MG tablet SMARTSIG:0.5 Tablet(s) By Mouth     vardenafil (LEVITRA) 20 MG tablet Take 0.5-1 tablets (10-20 mg total) by mouth daily as needed for erectile dysfunction. 10 tablet 5   zolpidem (AMBIEN) 5 MG tablet TAKE 1/2 TO 1 TABLET BY MOUTH AT BEDTIME AS NEEDED FOR SLEEP 30 tablet 0   No current facility-administered medications for this visit.    PAST MEDICAL HISTORY: Past Medical History:  Diagnosis Date   Alcohol abuse    Allergy    seasonal but not treated    Anxiety    Depression    Enlarged prostate    Gout    History of hematuria    Hyperlipidemia    Nocturia    Right adrenal mass ([place])     PAST SURGICAL HISTORY: Past Surgical History:  Procedure Laterality Date   EYE SURGERY  12-13 yrs ago   lasix eye surgery   INCISIONAL HERNIA REPAIR N/A 02/02/2016   Procedure: LAPAROSCOPIC INCISIONAL HERNIA;  Surgeon: Jackolyn Confer, MD;  Location: [facility];  Service: General;  Laterality: N/A;   INSERTION OF MESH N/A 02/02/2016   Procedure: INSERTION OF MESH;  Surgeon: Jackolyn Confer, MD;  [facility];  Service: General;  Laterality: N/A;   KNEE SURGERY Left 1989   arthroscopy   nerve cautery     jaw bone    ROBOTIC ADRENALECTOMY Right  08/05/2015   Procedure: XI ROBOTIC RIGHT ADRENALECTOMY ;  Surgeon: Alexis Frock, MD;  Location: [facility];  Service: Urology;  Laterality: Right;    FAMILY HISTORY: Family History  Problem Relation Age of Onset   Hyperlipidemia Father    Heart failure Father    Alcohol abuse Maternal Grandfather    Colon cancer Neg Hx    Stomach cancer Neg Hx    Rectal cancer Neg Hx    Colon polyps Neg Hx     SOCIAL HISTORY: Social History   Socioeconomic History   Marital status: Single    Spouse name: Not on file   Number of children: Not on file   Years of education: Not on file   Highest education level: Not  on file  Occupational History   Not on file  Tobacco Use   Smoking status: Never   Smokeless tobacco: Never  Substance and Sexual Activity   Alcohol use: Yes    Alcohol/week: 75.0 standard drinks    Types: 60 Cans of beer, 15 Shots of liquor per week    Comment: 6-7- drinks per day   Drug use: Yes    Types: Marijuana    Comment: couple of times a month   Sexual activity: Yes    Partners: Female  Other Topics Concern   Not on file  Social History Narrative   Not on file   Social Determinants of Health   Financial Resource Strain: Not on file  Food Insecurity: Not on file  Transportation Needs: Not on file  Physical Activity: Not on file  Stress: Not on file  Social Connections: Not on file  Intimate Partner Violence: Not on file      Marcial Pacas, M.D. Ph.D.  [facility] [address], [place], [place] [address] Ph: [phone] Fax: [phone]  CC:  Emeterio Reeve, [facility] [address] [place],  [place] [address]  Emeterio Reeve, DO

## 2021-02-17 ENCOUNTER — Ambulatory Visit (INDEPENDENT_AMBULATORY_CARE_PROVIDER_SITE_OTHER): Payer: 59 | Admitting: Osteopathic Medicine

## 2021-02-17 ENCOUNTER — Encounter: Payer: Self-pay | Admitting: Osteopathic Medicine

## 2021-02-17 ENCOUNTER — Other Ambulatory Visit: Payer: Self-pay

## 2021-02-17 VITALS — BP 107/71 | HR 66 | Temp 97.6°F | Wt 183.1 lb

## 2021-02-17 DIAGNOSIS — F411 Generalized anxiety disorder: Secondary | ICD-10-CM | POA: Diagnosis not present

## 2021-02-17 DIAGNOSIS — Z7189 Other specified counseling: Secondary | ICD-10-CM

## 2021-02-17 DIAGNOSIS — E782 Mixed hyperlipidemia: Secondary | ICD-10-CM

## 2021-02-17 DIAGNOSIS — K7030 Alcoholic cirrhosis of liver without ascites: Secondary | ICD-10-CM

## 2021-02-17 DIAGNOSIS — M109 Gout, unspecified: Secondary | ICD-10-CM | POA: Diagnosis not present

## 2021-02-17 LAB — HGB A1C W/O EAG: Hgb A1c MFr Bld: 5.4 % (ref 4.8–5.6)

## 2021-02-17 MED ORDER — ESCITALOPRAM OXALATE 10 MG PO TABS: ORAL_TABLET | ORAL | 3 refills | Status: DC

## 2021-02-17 MED ORDER — BUSPIRONE HCL 15 MG PO TABS: ORAL_TABLET | ORAL | 3 refills | Status: DC

## 2021-02-17 MED ORDER — IMIQUIMOD 5 % EX CREA: TOPICAL_CREAM | CUTANEOUS | 0 refills | Status: DC

## 2021-02-17 MED ORDER — ALLOPURINOL 300 MG PO TABS: 300.0000 mg | ORAL_TABLET | Freq: Every day | ORAL | 3 refills | Status: DC

## 2021-02-17 MED ORDER — ATORVASTATIN CALCIUM 10 MG PO TABS: ORAL_TABLET | ORAL | 3 refills | Status: DC

## 2021-02-17 NOTE — Patient Instructions (Addendum)
All meds same - refills sent     FYI to my patients: After six years here, I will be leaving practice at [facility] My last day here will be 03/19/2021. I will continue to provide your care up until that date, and you will still be considered a patient here after that as long as you want to be!    You will get a letter in the mail explaining details, but after 03/19/2021, my patients have several options to continue care:  1) you can establish care with Dr. Luetta Nutting or Samuel Bouche NP, who are accepting new patients here and are absorbing many folks from my current patient panel, OR...  2) you can see any available provider here on as-needed basis until my official replacement starts (hiring a new doctor has not been finalized yet), OR.Marland KitchenMarland Kitchen 3) if you choose to seek care elsewhere, this office will be happy to facilitate transfer of records, and will refill medications on a case-by-case basis.   It is bittersweet to leave! 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 I have truly enjoyed taking care of folks here, but I am also excited for my next adventure doing something a bit different. Take care, and please let us know if you have any questions!   -Dr. Loni Muse.

## 2021-02-17 NOTE — Progress Notes (Signed)
[name] Brown is a 55 y.o. male who presents to  [place] at [facility]  today, 02/17/21, seeking care for the following:  Following up from ER visits - records reviewed. Few episodes of chest/abdominal pain, described as "weird feeling" migrating in upper abdomen and into sternal area, no CP on exertion. He felt a lot better at second ER visit after having a solid BM and attributes his pain/discpmfort to his cirrhosis and toxins - needing to take lactulose more consistently which he has been doing. No additional episodes of pain, he'd like to defer cardiology referral / stress testing.  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 Diagnoses of Gout, unspecified cause, unspecified chronicity, unspecified site, Anxiety state, Mixed hyperlipidemia, and Cardiac risk counseling were also pertinent to this visit.   Refills sent for usual meds Switch Podofilox to Imiquimod cream  Pt aware I am leaving practice - will f/u w/ Dr Zigmund Daniel  Recent labs reviewed from ER and neurology - no concerns   No orders of the defined types were placed in this encounter.   Meds ordered this encounter  Medications   allopurinol (ZYLOPRIM) 300 MG tablet    Sig: Take 1 tablet (300 mg total) by mouth daily.    Dispense:  90 tablet    Refill:  3   atorvastatin (LIPITOR) 10 MG tablet    Sig: TAKE 1 TABLET(10 MG) BY MOUTH DAILY    Dispense:  90 tablet    Refill:  3   busPIRone (BUSPAR) 15 MG tablet    Sig: TAKE 1 TABLET(15 MG) BY MOUTH TWICE DAILY    Dispense:  180 tablet    Refill:  3    ZERO refills remain on this prescription. Your patient is requesting advance approval of refills for this medication to  PREVENT ANY MISSED DOSES   escitalopram (LEXAPRO) 10 MG tablet    Sig: TAKE 1 TABLET(10 MG) BY MOUTH DAILY    Dispense:  90 tablet    Refill:  3   imiquimod (ALDARA) 5 % cream    Sig: Apply topically 3 (three) times a week. To genital wart affected skin. Can reduce use to twice weekly if irritation    Dispense:  12 each    Refill:  0     See below for relevant physical exam findings  See below for recent lab and imaging results reviewed  Medications, allergies, PMH, PSH, SocH, FamH reviewed below    Follow-up instructions: Return in about 6 months (around 08/17/2021) for ROUTINE ANNUAL PHYSICAL (OR SOONER IF NEEDED) W/ DR MATTHEWS .                                        Exam:  BP 107/71 (BP Location: Left Arm, Patient Position: Sitting, Cuff Size: Normal)   Pulse 66   Temp 97.6 F (36.4 C) (Oral)   Wt 183 lb 1.3 oz (83 kg)   BMI 28.67 kg/m  Constitutional: VS see above. General Appearance: alert, well-developed, well-nourished, NAD Neck: No masses, trachea midline.  Respiratory: Normal respiratory effort. no wheeze, no rhonchi, no rales Cardiovascular: S1/S2 normal,  no murmur, no rub/gallop auscultated. RRR.  Abdominal: non-tender, Neurological: Normal balance/coordination. No tremor. Skin: warm, dry, intact.  Psychiatric: Normal judgment/insight. Normal mood and affect. Oriented x3.   Current Meds  Medication Sig   finasteride (PROSCAR) 5 MG tablet Take 1 tablet (5 mg total) by mouth every evening.   gabapentin (NEURONTIN) 100 MG capsule Take 1 capsule (100 mg total) by mouth 3 (three) times daily.   imiquimod (ALDARA) 5 % cream Apply topically 3 (three) times a week. To genital wart affected skin. Can reduce use to twice weekly if irritation   indomethacin (INDOCIN) 25 MG capsule Take 2 capsules (50 mg total) by mouth 3 (three) times daily as needed. For 1-3 days until gout flare subsides, then reduce dose to 1 capsule (25 mg total)  twice daily as needed   lactulose (CEPHULAC) 10 g packet Take 10 g by mouth 2 (two) times daily.   Multiple Vitamin (MULTIVITAMIN WITH MINERALS) TABS tablet Take 1 tablet by mouth daily.   sildenafil (VIAGRA) 100 MG tablet SMARTSIG:0.5 Tablet(s) By Mouth   vardenafil (LEVITRA) 20 MG tablet Take 0.5-1 tablets (10-20 mg total) by mouth daily as needed for erectile dysfunction.   zolpidem (AMBIEN) 5 MG tablet TAKE 1/2 TO 1 TABLET BY MOUTH AT BEDTIME AS NEEDED FOR SLEEP   [DISCONTINUED] allopurinol (ZYLOPRIM) 300 MG tablet TAKE 1 TABLET BY MOUTH EVERY DAY   [DISCONTINUED] atorvastatin (LIPITOR) 10 MG tablet TAKE 1 TABLET(10 MG) BY MOUTH DAILY   [DISCONTINUED] busPIRone (BUSPAR) 15 MG tablet TAKE 1 TABLET(15 MG) BY MOUTH TWICE DAILY   [DISCONTINUED] escitalopram (LEXAPRO) 10 MG tablet TAKE 1 TABLET(10 MG) BY MOUTH DAILY   [DISCONTINUED] podofilox (CONDYLOX) 0.5 % external solution APPLY TOPICALLY TO THE AFFECTED AREA TWICE DAILY    Allergies  Allergen Reactions   Viibryd [Vilazodone Hcl]     Pt lost sense of taste     Patient Active Problem List   Diagnosis Date Noted   Paresthesia 02/16/2021   At risk for obstructive sleep apnea 08/09/2018   Family history of coronary artery disease 08/09/2018   Paresthesia of arm 11/10/2017   Elevated fasting glucose 11/10/2017   Hepatic cirrhosis ([place]) 06/02/2016   Insomnia 06/02/2016   Incisional hernia s/p laparoscopic repair with mesh 02/02/16 02/02/2016   Incisional hernia, without obstruction or gangrene 01/04/2016   Benign mass of adrenal gland ([place]) 05/25/2015   History of colonic polyps 05/05/2015   Hematuria 03/26/2015   Prostatitis 03/26/2015   Anxiety state 02/03/2015   Essential hypertension 05/27/2014   Elevated LFTs 05/27/2014   Hyperlipidemia    Substance induced mood disorder ([place]) 08/23/2013   Thrombocytopenia ([place]) 08/14/2013   Transaminitis 08/14/2013   Total bilirubin, elevated 08/14/2013   Alcohol withdrawal ([place])  08/12/2013   Depression 08/12/2013   Gout 08/12/2013   Alcohol abuse 08/08/2013    Family History  Problem Relation Age of Onset   Hyperlipidemia Father    Heart failure Father    Alcohol abuse Maternal Grandfather    Colon cancer Neg Hx    Stomach cancer Neg Hx    Rectal cancer Neg Hx    Colon polyps Neg Hx     Social History   Tobacco Use  Smoking Status Never  Smokeless Tobacco Never    Past Surgical History:  Procedure Laterality Date   EYE SURGERY  12-13 yrs ago   lasix eye surgery   INCISIONAL HERNIA REPAIR N/A 02/02/2016   Procedure: LAPAROSCOPIC INCISIONAL HERNIA;  Surgeon: Jackolyn Confer, MD;  Location: [facility];  Service: General;  Laterality: N/A;   INSERTION OF MESH N/A 02/02/2016   Procedure: INSERTION OF MESH;  Surgeon: Jackolyn Confer, MD;  Location: [facility];  Service: General;  Laterality: N/A;   KNEE SURGERY Left 1989   arthroscopy   nerve cautery     jaw bone    ROBOTIC ADRENALECTOMY Right 08/05/2015   Procedure: XI ROBOTIC RIGHT ADRENALECTOMY ;  Surgeon: Alexis Frock, MD;  Location: [facility];  Service: Urology;  Laterality: Right;    Immunization History  Administered Date(s) Administered   Hepatitis A, Adult 04/01/2020, 09/30/2020   Influenza,inj,Quad PF,6+ Mos 06/05/2015, 06/02/2016, 05/25/2017   PFIZER(Purple Top)SARS-COV-2 Vaccination 09/19/2019, 10/17/2019   Pneumococcal Polysaccharide-23 08/13/2013   Tdap 01/06/2015   Zoster Recombinat (Shingrix) 02/19/2020, 05/04/2020    Recent Results (from the past 2160 hour(s))  Vitamin B12     Status: None   Collection Time: 02/16/21  3:29 PM  Result Value Ref Range   Vitamin B-12 1,053 232 - 1,245 pg/mL  RPR     Status: None   Collection Time: 02/16/21  3:29 PM  Result Value Ref Range   RPR Ser Ql Non Reactive Non Reactive  HIV Antibody (routine testing w rflx)     Status: None   Collection Time: 02/16/21  3:29 PM  Result Value Ref Range   HIV Screen 4th Generation wRfx Non Reactive Non Reactive     Comment: HIV Negative HIV-1/HIV-2 antibodies and HIV-1 p24 antigen were NOT detected. There is no laboratory evidence of HIV infection.   C-reactive protein     Status: None   Collection Time: 02/16/21  3:29 PM  Result Value Ref Range   CRP <1 0 - 10 mg/L  TSH     Status: None   Collection Time: 02/16/21  3:29 PM  Result Value Ref Range   TSH 1.560 0.450 - 4.500 uIU/mL  CK     Status: None   Collection Time: 02/16/21  3:29 PM  Result Value Ref Range   Total CK 149 41 - 331 U/L  Sedimentation rate     Status: None   Collection Time: 02/16/21  3:29 PM  Result Value Ref Range   Sed Rate 20 0 - 30 mm/hr  ANA w/Reflex if Positive     Status: None   Collection Time: 02/16/21  3:29 PM  Result Value Ref Range   Anti Nuclear Antibody (ANA) Negative Negative  Multiple Myeloma Panel (SPEP&IFE w/QIG)     Status: None (Preliminary result)   Collection Time: 02/16/21  3:29 PM  Result Value Ref Range   IgG (Immunoglobin G), Serum WILL FOLLOW    IgA/Immunoglobulin A, Serum WILL FOLLOW    IgM (Immunoglobulin M), Srm WILL FOLLOW    Total Protein WILL FOLLOW    Albumin SerPl Elph-Mcnc WILL FOLLOW    Alpha 1 WILL FOLLOW    Alpha2 Glob SerPl Elph-Mcnc WILL FOLLOW    B-Globulin SerPl Elph-Mcnc WILL FOLLOW    Gamma Glob SerPl Elph-Mcnc WILL FOLLOW    M Protein SerPl Elph-Mcnc WILL FOLLOW    Globulin, Total WILL FOLLOW    Albumin/Glob SerPl WILL FOLLOW    IFE 1 WILL FOLLOW    Please Note WILL FOLLOW   Hgb A1c w/o eAG     Status: None   Collection Time: 02/16/21  3:36 PM  Result Value Ref Range   Hgb A1c MFr Bld 5.4 4.8 - 5.6 %    Comment:  Prediabetes: 5.7 - 6.4          Diabetes: >6.4          Glycemic control for adults with diabetes: <7.0     No results found.     All questions at time of visit were answered - patient instructed to contact office with any additional concerns or updates. ER/RTC precautions were reviewed with the patient as applicable.   Please note:  manual typing as well as voice recognition software may have been used to produce this document - typos may escape review. Please contact Dr. Sheppard Coil for any needed clarifications.   Total encounter time on date of service, 02/17/21, was 40 minutes spent addressing problems/issues as noted above in [place], including time spent in discussion with patient regarding the HPI, ROS, confirming history, reviewing Assessment & Plan, as well as time spent on coordination of care, record review.

## 2021-02-19 LAB — MULTIPLE MYELOMA PANEL, SERUM
Albumin SerPl Elph-Mcnc: 4.2 g/dL (ref 2.9–4.4)
Albumin/Glob SerPl: 1.3 (ref 0.7–1.7)
Alpha 1: 0.2 g/dL (ref 0.0–0.4)
Alpha2 Glob SerPl Elph-Mcnc: 0.8 g/dL (ref 0.4–1.0)
B-Globulin SerPl Elph-Mcnc: 1.1 g/dL (ref 0.7–1.3)
Gamma Glob SerPl Elph-Mcnc: 1.2 g/dL (ref 0.4–1.8)
Globulin, Total: 3.3 g/dL (ref 2.2–3.9)
IgA/Immunoglobulin A, Serum: 336 mg/dL (ref 90–386)
IgG (Immunoglobin G), Serum: 1308 mg/dL (ref 603–1613)
IgM (Immunoglobulin M), Srm: 24 mg/dL (ref 20–172)
Total Protein: 7.5 g/dL (ref 6.0–8.5)

## 2021-02-19 LAB — TSH: TSH: 1.56 u[IU]/mL (ref 0.450–4.500)

## 2021-02-19 LAB — SEDIMENTATION RATE: Sed Rate: 20 mm/hr (ref 0–30)

## 2021-02-19 LAB — CK: Total CK: 149 U/L (ref 41–331)

## 2021-02-19 LAB — RPR: RPR Ser Ql: NONREACTIVE

## 2021-02-19 LAB — C-REACTIVE PROTEIN: CRP: <1 mg/L (ref 0–10)

## 2021-02-19 LAB — VITAMIN B12: Vitamin B-12: 1053 pg/mL (ref 232–1245)

## 2021-02-19 LAB — ANA W/REFLEX IF POSITIVE: Anti Nuclear Antibody (ANA): NEGATIVE

## 2021-02-19 LAB — HIV ANTIBODY (ROUTINE TESTING W REFLEX): HIV Screen 4th Generation wRfx: NONREACTIVE

## 2021-03-28 ENCOUNTER — Other Ambulatory Visit: Payer: Self-pay | Admitting: Family Medicine

## 2021-03-28 DIAGNOSIS — G4700 Insomnia, unspecified: Secondary | ICD-10-CM

## 2021-04-27 ENCOUNTER — Other Ambulatory Visit: Payer: Self-pay | Admitting: Osteopathic Medicine

## 2021-05-12 ENCOUNTER — Ambulatory Visit (INDEPENDENT_AMBULATORY_CARE_PROVIDER_SITE_OTHER): Payer: 59 | Admitting: Neurology

## 2021-05-12 ENCOUNTER — Ambulatory Visit: Payer: 59 | Admitting: Neurology

## 2021-05-12 ENCOUNTER — Telehealth: Payer: Self-pay | Admitting: Neurology

## 2021-05-12 DIAGNOSIS — G5603 Carpal tunnel syndrome, bilateral upper limbs: Secondary | ICD-10-CM | POA: Diagnosis not present

## 2021-05-12 DIAGNOSIS — R202 Paresthesia of skin: Secondary | ICD-10-CM

## 2021-05-12 NOTE — Telephone Encounter (Signed)
Referral to hand surgeon sent to [facility]. Phone: [phone].

## 2021-05-12 NOTE — Progress Notes (Signed)
No chief complaint on file.     ASSESSMENT AND PLAN  [name] Brown is a 55 y.o. male   Bilateral carpal tunnel syndrome  Confirmed by EMG nerve conduction study, moderate, right worse than left, demyelinating in nature, no evidence of axonal loss,  That the hand symptoms are very bothersome for him, he works as a Therapist, sports,  I have suggested heating pad, wrist splint, as needed NSAIDs, gabapentin 100 mg 3 times a day as needed  Discussed with patient, he desire further intervention, referral to hand surgeon,  DIAGNOSTIC DATA (LABS, IMAGING, TESTING) - I reviewed patient records, labs, notes, testing and imaging myself where available. Laboratory evaluation on  February 13, 2021 from [facility]: CBC Hg15.7,  CMP Creat 0.7, LDL 76,    MEDICAL HISTORY:  [name] Brown is a 55 year old male, seen in request by her primary care doctor Emeterio Reeve for evaluation of bilateral hands paresthesia, initial evaluation was on February 16, 2021  I reviewed and summarized the referring note.  Past medical history Hyperlipidemia Depression, anxiety Alcohol abuse, last drink March 9th 2019.  Cirrhosis.  He had a history of heavy alcohol abuse, cirrhosis, last drink was in March 2019, also suffered depression anxiety, with significant improvement after starting Lexapro and BuSpar  Around 2019, he began to notice intermittent bilateral hands numbness tingling, getting worse since 2021, he works as a Therapist, sports, reported frequent daytime finger numbness extending above the elbow, right worse than left, denies weakness, also waking him up from sleep, he has to shake his hands, he denies bilateral lower extremity paresthesia  Update May 12, 2021 He returns for electrodiagnostic study today, which showed moderate carpal tunnel, demyelinating in nature, right worse than left, he has tried gabapentin 100 mg as needed, it does help finger burning pain, also help him  sleep better, he desired further intervention will proceed with hand surgery refer  PHYSICAL EXAM:   There were no vitals filed for this visit.  Not recorded     There is no height or weight on file to calculate BMI.  PHYSICAL EXAMNIATION:  Gen: NAD, conversant, well nourised, well groomed                     Cardiovascular: Regular rate rhythm, no peripheral edema, warm, nontender. Eyes: Conjunctivae clear without exudates or hemorrhage Neck: Supple, no carotid bruits. Pulmonary: Clear to auscultation bilaterally   NEUROLOGICAL EXAM:  MENTAL STATUS: Speech:    Speech is normal; fluent and spontaneous with normal comprehension.  Cognition:     Orientation to time, place and person     Normal recent and remote memory     Normal Attention span and concentration     Normal Language, naming, repeating,spontaneous speech     Fund of knowledge   CRANIAL NERVES: CN II: Visual fields are full to confrontation. Pupils are round equal and briskly reactive to light. CN III, IV, VI: extraocular movement are normal. No ptosis. CN V: Facial sensation is intact to light touch CN VII: Face is symmetric with normal eye closure  CN VIII: Hearing is normal to causal conversation. CN IX, X: Phonation is normal. CN XI: Head turning and shoulder shrug are intact  MOTOR:Muscle bulk and tone are normal. Muscle strength is normal.  Bilateral wrist Tinel signs  REFLEXES: Reflexes are 2+ and symmetric at the biceps, triceps, knees, and ankles. Plantar responses are flexor.  SENSORY: Intact to light touch, pinprick and vibratory sensation  are intact in fingers and toes.  COORDINATION: There is no trunk or limb dysmetria noted.  GAIT/STANCE: Posture is normal. Gait is steady with normal steps, base, arm swing, and turning. Heel and toe walking are normal. Tandem gait is normal.  Romberg is absent.  REVIEW OF SYSTEMS:  Full 14 system review of systems performed and notable only for as  above All other review of systems were negative.   ALLERGIES: Allergies  Allergen Reactions   Viibryd [Vilazodone Hcl]     Pt lost sense of taste     HOME MEDICATIONS: Current Outpatient Medications  Medication Sig Dispense Refill   allopurinol (ZYLOPRIM) 300 MG tablet Take 1 tablet (300 mg total) by mouth daily. 90 tablet 3   atorvastatin (LIPITOR) 10 MG tablet TAKE 1 TABLET(10 MG) BY MOUTH DAILY 90 tablet 3   busPIRone (BUSPAR) 15 MG tablet TAKE 1 TABLET(15 MG) BY MOUTH TWICE DAILY 180 tablet 3   escitalopram (LEXAPRO) 10 MG tablet TAKE 1 TABLET(10 MG) BY MOUTH DAILY 90 tablet 3   finasteride (PROSCAR) 5 MG tablet Take 1 tablet (5 mg total) by mouth every evening. 90 tablet 3   gabapentin (NEURONTIN) 100 MG capsule Take 1 capsule (100 mg total) by mouth 3 (three) times daily. 90 capsule 11   imiquimod (ALDARA) 5 % cream APPLY TOPICALLY TO THE AFFECTED AREA 3 TIMES A WEEK TO GENITAL WART AFFECTED SKIN. CAN REDUCE USE TO TWICE WEEKLY IF IRRITATION 12 each 0   indomethacin (INDOCIN) 25 MG capsule Take 2 capsules (50 mg total) by mouth 3 (three) times daily as needed. For 1-3 days until gout flare subsides, then reduce dose to 1 capsule (25 mg total) twice daily as needed 30 capsule 1   lactulose (CEPHULAC) 10 g packet Take 10 g by mouth 2 (two) times daily.     Multiple Vitamin (MULTIVITAMIN WITH MINERALS) TABS tablet Take 1 tablet by mouth daily.     sildenafil (VIAGRA) 100 MG tablet SMARTSIG:0.5 Tablet(s) By Mouth     vardenafil (LEVITRA) 20 MG tablet Take 0.5-1 tablets (10-20 mg total) by mouth daily as needed for erectile dysfunction. 10 tablet 5   zolpidem (AMBIEN) 5 MG tablet TAKE 1/2 TO 1 TABLET BY MOUTH AT BEDTIME AS NEEDED FOR SLEEP 30 tablet 1   No current facility-administered medications for this visit.    PAST MEDICAL HISTORY: Past Medical History:  Diagnosis Date   Alcohol abuse    Allergy    seasonal but not treated    Anxiety    Depression    Enlarged prostate     Gout    History of hematuria    Hyperlipidemia    Nocturia    Right adrenal mass ([place])     PAST SURGICAL HISTORY: Past Surgical History:  Procedure Laterality Date   EYE SURGERY  12-13 yrs ago   lasix eye surgery   INCISIONAL HERNIA REPAIR N/A 02/02/2016   Procedure: LAPAROSCOPIC INCISIONAL HERNIA;  Surgeon: Jackolyn Confer, MD;  Location: [facility];  Service: General;  Laterality: N/A;   INSERTION OF MESH N/A 02/02/2016   Procedure: INSERTION OF MESH;  Surgeon: Jackolyn Confer, MD;  Location: [facility];  Service: General;  Laterality: N/A;   KNEE SURGERY Left 1989   arthroscopy   nerve cautery     jaw bone    ROBOTIC ADRENALECTOMY Right 08/05/2015   Procedure: XI ROBOTIC RIGHT ADRENALECTOMY ;  Surgeon: Alexis Frock, MD;  Location: [facility];  Service: Urology;  Laterality: Right;  FAMILY HISTORY: Family History  Problem Relation Age of Onset   Hyperlipidemia Father    Heart failure Father    Alcohol abuse Maternal Grandfather    Colon cancer Neg Hx    Stomach cancer Neg Hx    Rectal cancer Neg Hx    Colon polyps Neg Hx     SOCIAL HISTORY: Social History   Socioeconomic History   Marital status: Single    Spouse name: Not on file   Number of children: Not on file   Years of education: Not on file   Highest education level: Not on file  Occupational History   Not on file  Tobacco Use   Smoking status: Never   Smokeless tobacco: Never  Substance and Sexual Activity   Alcohol use: Yes    Alcohol/week: 75.0 standard drinks    Types: 60 Cans of beer, 15 Shots of liquor per week    Comment: 6-7- drinks per day   Drug use: Yes    Types: Marijuana    Comment: couple of times a month   Sexual activity: Yes    Partners: Female  Other Topics Concern   Not on file  Social History Narrative   Not on file   Social Determinants of Health   Financial Resource Strain: Not on file  Food Insecurity: Not on file  Transportation Needs: Not on file  Physical Activity: Not  on file  Stress: Not on file  Social Connections: Not on file  Intimate Partner Violence: Not on file      Marcial Pacas, M.D. Ph.D.  [facility] [address], [place], [place] [address] Ph: [phone] Fax: [phone]  CC:  Emeterio Reeve, [facility] [address] [place],   [id number]  Emeterio Reeve, DO

## 2021-05-12 NOTE — Procedures (Signed)
Full Name: [name] Brown Gender: Male MRN #: [id number] Date of Birth: 14-Aug-1965    Visit Date: 05/12/2021 09:41 Age: 55 Years Examining Physician: Marcial Pacas, MD  Referring Physician: Marcial Pacas, MD History: 55 year old male complains of bilateral hands paresthesia  Summary of the test: Nerve conduction study: Bilateral median sensory responses were absent. Bilateral median motor responses showed moderately prolonged distal latency, right worse than left, with mildly decreased CMAP amplitude on the right side.  Right ulnar sensory and motor responses were normal.  Electromyography: Selected needle examinations of bilateral upper extremity muscle was performed, the only abnormality is mildly enlarged motor unit potential with decreased recruitment patterns at bilateral abductor pollicis brevis.  There is no evidence of active denervation.  Conclusion: This is an abnormal study.  There is electrodiagnostic evidence of bilateral median neuropathy across the wrist, consistent with bilateral carpal tunnel syndromes, moderate bilaterally, right worse than left, demyelinating in nature, there is no evidence of axonal loss.    ------------------------------- Marcial Pacas M.D. PhD  [facility] [address] [place], [place] [address] Tel: [phone] Fax: [phone]  Verbal informed consent was obtained from the patient, patient was informed of potential risk of procedure, including bruising, bleeding, hematoma formation, infection, muscle weakness, muscle pain, numbness, among others.        [place]    Nerve / Sites Muscle Latency Ref. Amplitude Ref. Rel Amp Segments Distance Velocity Ref. Area    ms ms mV mV %  cm m/s m/s mVms  R Median - APB     Wrist APB 6.8 ?4.4 3.6 ?4.0 100 Wrist - APB 7   20.3     Upper arm APB 11.0  2.9  79.3 Upper arm - Wrist 22 52 ?49 16.7  L Median - APB     Wrist APB 5.5 ?4.4 4.3 ?4.0 100 Wrist - APB 7   18.0     Upper  arm APB 9.4  4.3  98.9 Upper arm - Wrist 20 51 ?49 16.8  R Ulnar - ADM     Wrist ADM 3.3 ?3.3 10.6 ?6.0 100 Wrist - ADM 7   34.7     B.Elbow ADM 7.0  10.4  97.8 B.Elbow - Wrist 20 54 ?49 35.3     A.Elbow ADM 8.9  10.1  97 A.Elbow - B.Elbow 10 52 ?49 34.6           SNC    Nerve / Sites Rec. Site Peak Lat Ref.  Amp Ref. Segments Distance    ms ms V V  cm  R Median - Orthodromic (Dig II, Mid palm)     Dig II Wrist NR ?3.4 NR ?10 Dig II - Wrist 13  L Median - Orthodromic (Dig II, Mid palm)     Dig II Wrist NR ?3.4 NR ?10 Dig II - Wrist 13  R Ulnar - Orthodromic, (Dig V, Mid palm)     Dig V Wrist 3.1 ?3.1 8 ?5 Dig V - Wrist 79           F  Wave    Nerve F Lat Ref.   ms ms  R Ulnar - ADM 29.8 ?32.0       EMG Summary Table    Spontaneous MUAP Recruitment  Muscle IA Fib PSW Fasc Other Amp Dur. Poly Pattern  R. First dorsal interosseous Normal None None None _______ Normal Normal Normal Normal  R. Abductor pollicis brevis Normal None  None None _______ Normal Normal Normal Reduced  R. Pronator teres Normal None None None _______ Normal Normal Normal Normal  R. Triceps brachii Normal None None None _______ Normal Normal Normal Normal  R. Deltoid Normal None None None _______ Normal Normal Normal Normal  R. Biceps brachii Normal None None None _______ Normal Normal Normal Normal  L. Abductor pollicis brevis Normal None None None _______ Normal Normal Normal Reduced

## 2021-06-08 ENCOUNTER — Other Ambulatory Visit: Payer: Self-pay | Admitting: Neurology

## 2021-06-08 ENCOUNTER — Telehealth: Payer: Self-pay | Admitting: Neurology

## 2021-06-08 MED ORDER — GABAPENTIN 300 MG PO CAPS: 300.0000 mg | ORAL_CAPSULE | Freq: Three times a day (TID) | ORAL | 1 refills | Status: DC | PRN

## 2021-06-08 NOTE — Telephone Encounter (Signed)
Pt request refill for gabapentin (NEURONTIN) 100 MG capsule at [facility] #[id number]  Also want to know if the dosage can be increased. Would like a call from the nurse  Dr Krista Blue is out, sending to the work in physician.

## 2021-06-08 NOTE — Telephone Encounter (Signed)
Pt ordered 100mg  po tid gabapentin.  He has been taking 200-300mg  po qhs and sometimes taking 100-200 daily.  Is helping, just is having more pain.  Has seen hand surgeon, recommend surgery (time possible end January).  Asking if can increase gabapentin dose?  If so need prescription change.  Please advise.

## 2021-06-09 NOTE — Telephone Encounter (Signed)
I called pt and let him know that WID did increase the gabapentin to 300mg  po tid prn.  He was able to get the 100mg  caps by using goodrx at the pharmacy and will use those during day and the 300mg  po qhs .  He appreciated call back. I relayed that hopefully he will get his CTS scheduled soon.

## 2021-08-18 ENCOUNTER — Encounter: Payer: 59 | Admitting: Family Medicine

## 2021-09-27 ENCOUNTER — Other Ambulatory Visit: Payer: Self-pay | Admitting: Neurology

## 2021-12-20 ENCOUNTER — Other Ambulatory Visit: Payer: Self-pay | Admitting: Osteopathic Medicine

## 2021-12-20 DIAGNOSIS — F411 Generalized anxiety disorder: Secondary | ICD-10-CM

## 2021-12-22 ENCOUNTER — Other Ambulatory Visit: Payer: Self-pay | Admitting: Osteopathic Medicine

## 2022-01-28 ENCOUNTER — Telehealth: Payer: Self-pay

## 2022-01-28 DIAGNOSIS — M109 Gout, unspecified: Secondary | ICD-10-CM

## 2022-01-28 DIAGNOSIS — Z87438 Personal history of other diseases of male genital organs: Secondary | ICD-10-CM

## 2022-01-28 DIAGNOSIS — Z0000 Encounter for general adult medical examination without abnormal findings: Secondary | ICD-10-CM

## 2022-01-28 DIAGNOSIS — F1010 Alcohol abuse, uncomplicated: Secondary | ICD-10-CM

## 2022-01-28 DIAGNOSIS — I10 Essential (primary) hypertension: Secondary | ICD-10-CM

## 2022-01-28 DIAGNOSIS — E782 Mixed hyperlipidemia: Secondary | ICD-10-CM

## 2022-01-28 NOTE — Telephone Encounter (Signed)
Pt requesting lab orders be placed for upcoming annual appt with URIC ACID included.  (Due to the many specialty labs previously drawn unable to determine which orders to place)

## 2022-01-31 NOTE — Addendum Note (Signed)
Addended by: Perlie Mayo on: 01/31/2022 11:05 PM   Modules accepted: Orders

## 2022-01-31 NOTE — Telephone Encounter (Signed)
Orders entered

## 2022-02-01 NOTE — Telephone Encounter (Signed)
Patient advised.

## 2022-02-02 ENCOUNTER — Encounter: Payer: Self-pay | Admitting: Family Medicine

## 2022-02-02 ENCOUNTER — Ambulatory Visit (INDEPENDENT_AMBULATORY_CARE_PROVIDER_SITE_OTHER): Payer: 59 | Admitting: Family Medicine

## 2022-02-02 VITALS — BP 113/74 | HR 69 | Ht 67.0 in | Wt 192.0 lb

## 2022-02-02 DIAGNOSIS — Z0000 Encounter for general adult medical examination without abnormal findings: Secondary | ICD-10-CM

## 2022-02-02 DIAGNOSIS — F411 Generalized anxiety disorder: Secondary | ICD-10-CM

## 2022-02-02 DIAGNOSIS — F32A Depression, unspecified: Secondary | ICD-10-CM

## 2022-02-02 NOTE — Progress Notes (Signed)
[name] Brown - 56 y.o. male MRN [id number]  Date of birth: [birth year]  Subjective Chief Complaint  Patient presents with   Annual Exam    HPI [name] Brown is a 56 year old male here today for annual exam.  He denies any significant changes to his health since last visit.  She has history of alcoholism with alcoholic cirrhosis.  He has been sober for the past 4 years.  Would like referral to psychology for anxiety.  He had labs completed prior to visit however results have not returned yet.  He does try to stay fairly active.  He has been working on dietary changes.  He does not use nicotine products.  He does use marijuana occasionally.  Immunizations are up-to-date  Review of Systems  Constitutional:  Negative for chills, fever, malaise/fatigue and weight loss.  HENT:  Negative for congestion, ear pain and sore throat.   Eyes:  Negative for blurred vision, double vision and pain.  Respiratory:  Negative for cough and shortness of breath.   Cardiovascular:  Negative for chest pain and palpitations.  Gastrointestinal:  Negative for abdominal pain, blood in stool, constipation, heartburn and nausea.  Genitourinary:  Negative for dysuria and urgency.  Musculoskeletal:  Negative for joint pain and myalgias.  Neurological:  Negative for dizziness and headaches.  Endo/Heme/Allergies:  Does not bruise/bleed easily.  Psychiatric/Behavioral:  Negative for depression. The patient is not nervous/anxious and does not have insomnia.     Allergies  Allergen Reactions   Viibryd [Vilazodone Hcl]     Pt lost sense of taste     Past Medical History:  Diagnosis Date   Alcohol abuse    Allergy    seasonal but not treated    Anxiety    Depression    Enlarged prostate    Gout    History of hematuria    Hyperlipidemia    Nocturia    Right adrenal mass [facility])     Past Surgical History:  Procedure Laterality Date   EYE SURGERY  12-13 yrs ago   lasix eye surgery   INCISIONAL  HERNIA REPAIR N/A 02/02/2016   Procedure: LAPAROSCOPIC INCISIONAL HERNIA;  Surgeon: Jackolyn Confer, MD;  Location: [facility];  Service: General;  Laterality: N/A;   INSERTION OF MESH N/A 02/02/2016   Procedure: INSERTION OF MESH;  Surgeon: Jackolyn Confer, MD;  Location: [facility];  Service: General;  Laterality: N/A;   KNEE SURGERY Left 1989   arthroscopy   nerve cautery     jaw bone    ROBOTIC ADRENALECTOMY Right 08/05/2015   Procedure: XI ROBOTIC RIGHT ADRENALECTOMY ;  Surgeon: Alexis Frock, MD;  Location: [facility];  Service: Urology;  Laterality: Right;    Social History   Socioeconomic History   Marital status: Single    Spouse name: Not on file   Number of children: Not on file   Years of education: Not on file   Highest education level: Not on file  Occupational History   Not on file  Tobacco Use   Smoking status: Never   Smokeless tobacco: Never  Substance and Sexual Activity   Alcohol use: Yes    Alcohol/week: 75.0 standard drinks of alcohol    Types: 60 Cans of beer, 15 Shots of liquor per week    Comment: 6-7- drinks per day   Drug use: Yes    Types: Marijuana    Comment: couple of times a month   Sexual activity: Yes    Partners: Female  Other Topics Concern   Not on file  Social History Narrative   Not on file   Social Determinants of Health   Financial Resource Strain: Not on file  Food Insecurity: Not on file  Transportation Needs: Not on file  Physical Activity: Not on file  Stress: Not on file  Social Connections: Not on file    Family History  Problem Relation Age of Onset   Hyperlipidemia Father    Heart failure Father    Alcohol abuse Maternal Grandfather    Colon cancer Neg Hx    Stomach cancer Neg Hx    Rectal cancer Neg Hx    Colon polyps Neg Hx     Health Maintenance  Topic Date Due   COVID-19 Vaccine (3 - Pfizer series) 06/20/2022 (Originally 12/12/2019)   INFLUENZA VACCINE  09/18/2022 (Originally 01/18/2022)   COLONOSCOPY (Pts 45-45yr  Insurance coverage will need to be confirmed)  02/03/2023 (Originally 04/16/2020)   TETANUS/TDAP  01/05/2025   Hepatitis C Screening  Completed   HIV Screening  Completed   Zoster Vaccines- Shingrix  Completed   HPV VACCINES  Aged Out     ----------------------------------------------------------------------------------------------------------------------------------------------------------------------------------------------------------------- Physical Exam BP 113/74 (BP Location: Left Arm, Patient Position: Sitting, Cuff Size: Normal)   Pulse 69   Ht '5\' 7"'$  (1.702 m)   Wt 192 lb (87.1 kg)   SpO2 98%   BMI 30.07 kg/m   Physical Exam Constitutional:      General: He is not in acute distress. HENT:     Head: Normocephalic and atraumatic.     Right Ear: Tympanic membrane and external ear normal.     Left Ear: Tympanic membrane and external ear normal.  Eyes:     General: No scleral icterus. Neck:     Thyroid: No thyromegaly.  Cardiovascular:     Rate and Rhythm: Normal rate and regular rhythm.     Heart sounds: Normal heart sounds.  Pulmonary:     Effort: Pulmonary effort is normal.     Breath sounds: Normal breath sounds.  Abdominal:     General: Bowel sounds are normal. There is no distension.     Palpations: Abdomen is soft.     Tenderness: There is no abdominal tenderness. There is no guarding.  Musculoskeletal:     Cervical back: Normal range of motion.  Lymphadenopathy:     Cervical: No cervical adenopathy.  Skin:    General: Skin is warm and dry.     Findings: No rash.  Neurological:     Mental Status: He is alert and oriented to person, place, and time.     Cranial Nerves: No cranial nerve deficit.     Motor: No abnormal muscle tone.  Psychiatric:        Mood and Affect: Mood normal.        Behavior: Behavior normal.      ------------------------------------------------------------------------------------------------------------------------------------------------------------------------------------------------------------------- Assessment and Plan  Well adult exam Well adult Lab results pending Screenings: Per lab orders Immunizations: Up-to-date Anticipatory guidance/risk factor reduction: Recommendations per AVS  Anxiety state Referral to psychology.   No orders of the defined types were placed in this encounter.   No follow-ups on file.    This visit occurred during the SARS-CoV-2 public health emergency.  Safety protocols were in place, including screening questions prior to the visit, additional usage of staff PPE, and extensive cleaning of exam room while observing appropriate contact time as indicated for disinfecting solutions.

## 2022-02-02 NOTE — Assessment & Plan Note (Signed)
Well adult Lab results pending Screenings: Per lab orders Immunizations: Up-to-date Anticipatory guidance/risk factor reduction: Recommendations per AVS

## 2022-02-02 NOTE — Patient Instructions (Signed)

## 2022-02-02 NOTE — Assessment & Plan Note (Signed)
Referral to psychology.

## 2022-02-06 LAB — CBC WITH DIFFERENTIAL/PLATELET
Absolute Monocytes: 340 cells/uL (ref 200–950)
Basophils Absolute: 40 cells/uL (ref 0–200)
Basophils Relative: 1 %
Eosinophils Absolute: 120 cells/uL (ref 15–500)
Eosinophils Relative: 3 %
HCT: 44.9 % (ref 38.5–50.0)
Hemoglobin: 15.4 g/dL (ref 13.2–17.1)
Lymphs Abs: 1720 cells/uL (ref 850–3900)
MCH: 31.4 pg (ref 27.0–33.0)
MCHC: 34.3 g/dL (ref 32.0–36.0)
MCV: 91.6 fL (ref 80.0–100.0)
MPV: 10.8 fL (ref 7.5–12.5)
Monocytes Relative: 8.5 %
Neutro Abs: 1780 cells/uL (ref 1500–7800)
Neutrophils Relative %: 44.5 %
Platelets: 168 10*3/uL (ref 140–400)
RBC: 4.9 10*6/uL (ref 4.20–5.80)
RDW: 13 % (ref 11.0–15.0)
Total Lymphocyte: 43 %
WBC: 4 10*3/uL (ref 3.8–10.8)

## 2022-02-06 LAB — COMPLETE METABOLIC PANEL WITH GFR
AG Ratio: 1.6 (calc) (ref 1.0–2.5)
ALT: 21 U/L (ref 9–46)
AST: 26 U/L (ref 10–35)
Albumin: 4.6 g/dL (ref 3.6–5.1)
Alkaline phosphatase (APISO): 60 U/L (ref 35–144)
BUN: 16 mg/dL (ref 7–25)
CO2: 24 mmol/L (ref 20–32)
Calcium: 9.2 mg/dL (ref 8.6–10.3)
Chloride: 104 mmol/L (ref 98–110)
Creat: 0.79 mg/dL (ref 0.70–1.30)
Globulin: 2.8 g/dL (calc) (ref 1.9–3.7)
Glucose, Bld: 100 mg/dL — ABNORMAL HIGH (ref 65–99)
Potassium: 4 mmol/L (ref 3.5–5.3)
Sodium: 137 mmol/L (ref 135–146)
Total Bilirubin: 0.5 mg/dL (ref 0.2–1.2)
Total Protein: 7.4 g/dL (ref 6.1–8.1)
eGFR: 104 mL/min/{1.73_m2} (ref >=60)

## 2022-02-06 LAB — LIPID PANEL W/REFLEX DIRECT LDL
Cholesterol: 160 mg/dL (ref <200)
HDL: 47 mg/dL (ref >=40)
LDL Cholesterol (Calc): 92 mg/dL (calc)
Non-HDL Cholesterol (Calc): 113 mg/dL (calc) (ref <130)
Total CHOL/HDL Ratio: 3.4 (calc) (ref <5.0)
Triglycerides: 109 mg/dL (ref <150)

## 2022-02-06 LAB — VITAMIN B12: Vitamin B-12: 624 pg/mL (ref 200–1100)

## 2022-02-06 LAB — URIC ACID: Uric Acid, Serum: 5 mg/dL (ref 4.0–8.0)

## 2022-02-06 LAB — VITAMIN B1: Vitamin B1 (Thiamine): 63 nmol/L — ABNORMAL HIGH (ref 8–30)

## 2022-02-06 LAB — PSA: PSA: 0.2 ng/mL (ref <=4.00)

## 2022-02-24 ENCOUNTER — Other Ambulatory Visit: Payer: Self-pay

## 2022-02-24 ENCOUNTER — Other Ambulatory Visit: Payer: Self-pay | Admitting: Neurology

## 2022-02-24 DIAGNOSIS — F411 Generalized anxiety disorder: Secondary | ICD-10-CM

## 2022-02-24 MED ORDER — BUSPIRONE HCL 15 MG PO TABS: ORAL_TABLET | ORAL | 2 refills | Status: DC

## 2022-02-24 MED ORDER — ESCITALOPRAM OXALATE 10 MG PO TABS: ORAL_TABLET | ORAL | 3 refills | Status: DC

## 2022-02-24 NOTE — Progress Notes (Signed)
Completed.

## 2022-03-03 ENCOUNTER — Encounter: Payer: Self-pay | Admitting: Professional

## 2022-03-03 ENCOUNTER — Ambulatory Visit (INDEPENDENT_AMBULATORY_CARE_PROVIDER_SITE_OTHER): Payer: 59 | Admitting: Professional

## 2022-03-03 DIAGNOSIS — F331 Major depressive disorder, recurrent, moderate: Secondary | ICD-10-CM | POA: Diagnosis not present

## 2022-03-03 DIAGNOSIS — F419 Anxiety disorder, unspecified: Secondary | ICD-10-CM | POA: Insufficient documentation

## 2022-03-03 NOTE — Progress Notes (Signed)
° ° ° ° ° ° ° ° ° ° ° ° ° ° °  Laurita Peron, LCMHC °
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
## 2022-03-04 ENCOUNTER — Encounter (INDEPENDENT_AMBULATORY_CARE_PROVIDER_SITE_OTHER): Payer: 59 | Admitting: Family Medicine

## 2022-03-04 DIAGNOSIS — F419 Anxiety disorder, unspecified: Secondary | ICD-10-CM

## 2022-03-04 MED ORDER — ESCITALOPRAM OXALATE 20 MG PO TABS
20.0000 mg | ORAL_TABLET | Freq: Every day | ORAL | 3 refills | Status: DC
Start: 2022-03-04 — End: 2023-01-23

## 2022-03-04 NOTE — Telephone Encounter (Signed)
Please see the MyChart message reply(ies) for my assessment and plan.    This patient gave consent for this Medical Advice Message and is aware that it may result in a bill to their insurance company, as well as the possibility of receiving a bill for a co-payment or deductible. They are an established patient, but are not seeking medical advice exclusively about a problem treated during an in person or video visit in the last seven days. I did not recommend an in person or video visit within seven days of my reply.    I spent a total of 6 minutes cumulative time within 7 days through MyChart messaging.  Nellie Chevalier, DO   

## 2022-03-10 ENCOUNTER — Encounter: Payer: Self-pay | Admitting: Professional

## 2022-03-10 ENCOUNTER — Ambulatory Visit: Payer: 59 | Admitting: Professional

## 2022-03-10 DIAGNOSIS — F331 Major depressive disorder, recurrent, moderate: Secondary | ICD-10-CM | POA: Diagnosis not present

## 2022-03-10 DIAGNOSIS — F419 Anxiety disorder, unspecified: Secondary | ICD-10-CM | POA: Diagnosis not present

## 2022-03-10 NOTE — Progress Notes (Signed)
kk

## 2022-03-10 NOTE — Progress Notes (Addendum)
[place] Counselor/Therapist Progress Note  Patient ID: [name] Brown, MRN: [id number],    Date: 03/10/2022  Time Spent: 55 minutes 1003-1058am  Treatment Type: Individual Therapy  Risk Assessment: Danger to Self:  No Self-injurious Behavior: No Danger to Others: No Duty to Warn:no  Subjective: The patient arrived on time for his in-person session due to going to wrong location for appointment.  Issues addressed:  1-medication -had ran of of refills and missed two days -talked with PCP about increasing meds and he agreed to do so 2-treatment planning  -patient and Clinician completed treatment planning in session -patient fully participated and agrees with his treatment plan  Treatment Plan Problems Addressed  Anxiety, Depression, Low Self-Esteem, Medical Issues  Goals 1. Accept the illness, and adapt life to the necessary limitations. 2. Alleviate depressive symptoms and return to previous level of effective functioning. Objective Verbalize an accurate understanding of depression. Target Date: 2022-03-14 Frequency: Daily  Progress: 0 Modality: individual  Related Interventions Consistent with the treatment model, discuss how cognitive, behavioral, interpersonal, and/or other factors (e.g., family history) contribute to depression. Objective Identify and replace thoughts and beliefs that support depression. Target Date: 2022-03-14 Frequency: Daily  Progress: 0 Modality: individual  Related Interventions Assign the client to self-monitor thoughts, feelings, and actions in daily journal (e.g., "Negative Thoughts Trigger Negative Feelings" in the Adult Psychotherapy Homework Planner by Bryn Gulling; "Daily Record of Dysfunctional Thoughts" in Cognitive Therapy of Depression by Lupita Shutter, and Warren Lacy); process the journal material to challenge depressive thinking patterns and replace them with reality-based thoughts. Assign "behavioral experiments" in which  depressive automatic thoughts are treated as hypotheses/prediction, reality-based alternative hypotheses/prediction are generated, and both are tested against the client's past, present, and/or future experiences. Facilitate and reinforce the client's shift from biased depressive self-talk and beliefs to reality-based cognitive messages that enhance self-confidence and increase adaptive actions (see "Positive Self-Talk" in the Adult Psychotherapy Homework Planner by Bryn Gulling). Objective Learn and implement behavioral strategies to overcome depression. Target Date: 2022-03-14 Frequency: Daily  Progress: 0 Modality: individual  Related Interventions Engage the client in "behavioral activation," increasing his/her activity level and contact with sources of reward, while identifying processes that inhibit activation (see Behavioral Activation for Depression by Beverly Gust, Dimidjian, and Herman-Dunn; or assign "Identify and Schedule Pleasant Activities" in the Adult Psychotherapy Homework Planner by [facility]); use behavioral techniques such as instruction, rehearsal, role-playing, role reversal, as needed, to facilitate activity in the client's daily life; reinforce success. Assist the client in developing skills that increase the likelihood of deriving pleasure from behavioral activation (e.g., assertiveness skills, developing an exercise plan, less internal/more external focus, increased social involvement); reinforce success. Objective Learn and implement problem-solving and decision-making skills. Target Date: 2022-03-14 Frequency: Daily  Progress: 0 Modality: individual  Related Interventions Encourage in the client the development of a positive problem orientation in which problems and solving them are viewed as a natural part of life and not something to be feared, despaired, or avoided. Conduct Problem-Solving Therapy (see Problem-Solving Therapy by Shawnee Knapp and Delane Ginger) using techniques such as  psychoeducation, modeling, and role-playing to teach client problem-solving skills (i.e., defining a problem specifically, generating possible solutions, evaluating the pros and cons of each solution, selecting and implementing a plan of action, evaluating the efficacy of the plan, accepting or revising the plan); role-play application of the problem-solving skill to a real life issue (or assign "Applying Problem-Solving to Interpersonal Conflict" in the Adult Psychotherapy Homework Planner by Bryn Gulling). Objective Verbalize an understanding of  healthy and unhealthy emotions with the intent of increasing the use of healthy emotions to guide actions. Target Date: 2022-03-14 Frequency: Daily  Progress: 0 Modality: individual  Related Interventions Use a process-experiential approach consistent with Emotion-Focused Therapy to create a safe, nurturing environment in which the client can process emotions, learning to identify and regulate unhealthy feelings and to generate more adaptive ones that then guide actions (see Emotion-Focused Therapy for Depression by Andrey Spearman). Objective Verbalize insight into how past relationships may be influencing current experiences with depression. Target Date: 2022-03-14 Frequency: Daily  Progress: 0 Modality: individual  Related Interventions Explore experiences from the client's childhood that contribute to current depressed state. Explain a connection between previously unexpressed (repressed) feelings of anger (and helplessness) and current state of depression. Objective Increasingly verbalize hopeful and positive statements regarding self, others, and the future. Target Date: 2022-03-14 Frequency: Daily  Progress: 0 Modality: individual  Related Interventions Teach the client more about depression and how to recognize and accept some sadness as a normal variation in feeling. Assign the client to write at least one positive affirmation statement daily  regarding himself/herself and the future (or assign "Positive Self-Talk" in the Adult Psychotherapy Homework Planner by Bryn Gulling). Objective Verbalize an understanding and resolution of current interpersonal problems. Target Date: 2022-03-14 Frequency: Daily  Progress: 0 Modality: individual  Related Interventions For role transitions (e.g., beginning or ending a relationship or career, moving, promotion, retirement, graduation), help the client mourn the loss of the old role while recognizing positive and negative aspects of the new role, and taking steps to gain mastery over the new role. For interpersonal deficits, help the client develop new interpersonal skills and relationships. 3. Appropriately grieve the loss in order to normalize mood and to return to previously adaptive level of functioning. 4. Become as knowledgeable as possible about the diagnosed condition and about living as normally as possible. 5. Develop a consistent, positive self-image. 6. Develop healthy interpersonal relationships that lead to the alleviation and help prevent the relapse of depression. 7. Develop healthy thinking patterns and beliefs about self, others, and the world that lead to the alleviation and help prevent the relapse of depression. 8. Elevate self-esteem. Objective Increase insight into the historical and current sources of low self-esteem. Target Date: 2022-03-14 Frequency: Daily  Progress: 0 Modality: individual  Related Interventions Help the client become aware of his/her fear of rejection and its connection with past rejection or abandonment experiences; begin to contrast past experiences of pain with present experiences of acceptance and competence. Objective Decrease the frequency of negative self-descriptive statements and increase frequency of positive self-descriptive statements. Target Date: 2022-03-14 Frequency: Daily  Progress: 0 Modality: individual  Related Interventions Assist the  client in becoming aware of how he/she expresses or acts out negative feelings about himself/herself. Help the client reframe his/her negative assessment of himself/herself. Assist the client in developing positive self-talk as a way of boosting his/her confidence and self-image (or assign "Positive Self-Talk" in the Adult Psychotherapy Homework Planner by Bryn Gulling). Objective Identify and replace negative self-talk messages used to reinforce low self-esteem. Target Date: 2022-03-14 Frequency: Daily  Progress: 0 Modality: individual  Related Interventions Help the client identify his/her distorted, negative beliefs about self and the world and replace these messages with more realistic, affirmative messages (or assign "Journal and Replace Self-Defeating Thoughts" in the Adult Psychotherapy Homework Planner by [facility] or read What to Say When You Talk to Yourself by Helmstetter). Objective Form realistic, appropriate, and attainable goals for self  in all areas of life. Target Date: 2022-03-14 Frequency: Daily  Progress: 0 Modality: individual  Related Interventions Help the client analyze his/her goals to make sure they are realistic and attainable. Assign the client to make a list of goals for various areas of life and a plan for steps toward goal attainment. Objective Positively acknowledge verbal compliments from others. Target Date: 2022-03-14 Frequency: Daily  Progress: 0 Modality: individual  Related Interventions Assign the client to be aware of and acknowledge graciously (without discounting) praise and compliments from others. Objective Demonstrate an increased ability to identify and express personal feelings. Target Date: 2022-03-14 Frequency: Daily  Progress: 0 Modality: individual  Related Interventions Assist the client in identifying and labeling emotions. Objective Identify positive traits and talents about self. Target Date: 2022-03-14 Frequency: Daily  Progress: 0  Modality: individual  Related Interventions Assign the client the exercise of identifying his/her positive physical characteristics in a mirror to help him/her become more comfortable with himself/herself. Objective Articulate a plan to be proactive in trying to get identified needs met. Target Date: 2022-03-14 Frequency: Daily  Progress: 0 Modality: individual  Related Interventions Assist the client in identifying and verbalizing his/her needs, met and unmet. Assist the client in developing a specific action plan to get each need met (or assign "Satisfying Unmet Emotional Needs" in the Adult Psychotherapy Homework Planner by [facility]). 9. Enhance ability to effectively cope with the full variety of life's worries and anxieties. 10. Establish an inward sense of self-worth, confidence, and competence. 11. Interact socially without undue distress or disability. 12. Learn and implement coping skills that result in a reduction of anxiety and worry, and improved daily functioning. 13. Medically stabilize physical condition. Objective Identify the losses or limitations that have been experienced due to the medical condition. Target Date: 2022-03-14 Frequency: Daily  Progress: 0 Modality: individual  Related Interventions Ask the client to list the changes, losses, or limitations that have resulted from the medical condition (or assign "The Impact of My Illness" in the Adult Psychotherapy Homework Planner by Bryn Gulling). Objective Engage in social, productive, and recreational activities that are possible in spite of medical condition. Target Date: 2022-03-14 Frequency: Daily  Progress: 0 Modality: individual  Related Interventions Sort out with the client activities that he/she can still enjoy either alone or with others (or assign "Identify and Schedule Pleasant Activities" in the Adult Psychotherapy Homework Planner by Bryn Gulling). Solicit a commitment from the client to increase his/her activity  level by engaging in enjoyable and challenging activities; reinforce such engagement. Objective Learn and implement skills for preventing lapses back into more stressful reactions. Target Date: 2022-03-14 Frequency: Daily  Progress: 0 Modality: individual  Related Interventions Teach the client relapse prevention skills including distinguishing between a lapse and relapse, identifying and rehearsing the management of high-risk situations using skills learned in therapy, building a less stressful lifestyle, and periodically attending "booster" sessions of therapy. Objective Demonstrate mastery of coping skills by applying them to daily life situations. Target Date: 2022-03-14 Frequency: Daily  Progress: 0 Modality: individual  Related Interventions Encourage skill development by having the client rehearse and practice coping skills in session through imaginal and/or behavioral rehearsal. Facilitate generalization of skills into everyday life by assigning homework (e.g., "Plan Before Acting" or "Journal and Replace Self-Defeating Thoughts" in the Adult Psychotherapy Homework Planner by [facility]) in which the patient applies coping skills in graduating more demanding stressful situations; review, reinforcing success and problem-solving obstacles toward effective use of skills. Help the client internalize his/her new  skill set and build self-efficacy by ensuring that the client "takes credit" for improvement and makes self-attributions for change. Objective Learn and implement skills for managing stress. Target Date: 2022-03-14 Frequency: Daily  Progress: 0 Modality: individual  Related Interventions Conduct skills training, building upon effective coping strategies the client possesses, and teaching new skills tailored to the specific stressor. Train problem-focused personal and interpersonal coping skills (e.g., problem-solving, communication, conflict resolution, accessing social supports). Train  emotionally focused coping skills (e.g., calming skills, perspective taking, emotional regulation, cognitive reframing). Objective Work with therapist to develop a plan for coping with stress. Target Date: 2022-03-14 Frequency: Daily  Progress: 0 Modality: individual  Related Interventions Assist the client in developing a tailored coping action plan for preventing and/or managing identified stressful reactions using skills such as relaxation, exercise, cognitive reframing, and problem-solving. Objective Verbalize an increased understanding of the steps to grieving the losses brought on by the medical condition. Target Date: 2022-03-14 Frequency: Daily  Progress: 0 Modality: individual  Related Interventions Educate the client on the stages of the grieving process and answer any questions that he/she may have. 14. Recognize, accept, and cope with feelings of depression. 15. Reduce fear, anxiety, and worry associated with the medical condition. 16. Reduce overall frequency, intensity, and duration of the anxiety so that daily functioning is not impaired. 17. Resolve the core conflict that is the source of anxiety. 18. Stabilize anxiety level while increasing ability to function on a daily basis. Objective Describe situations, thoughts, feelings, and actions associated with anxieties and worries, their impact on functioning, and attempts to resolve them. Target Date: 2022-03-14 Frequency: Daily  Progress: 0 Modality: individual  Related Interventions Ask the client to describe his/her past experiences of anxiety and their impact on functioning; assess the focus, excessiveness, and uncontrollability of the worry and the type, frequency, intensity, and duration of his/her anxiety symptoms (consider using a structured interview such as The Anxiety Disorders Interview Schedule-Adult Version). Objective Verbalize an understanding of the cognitive, physiological, and behavioral components of anxiety  and its treatment. Target Date: 2022-03-14 Frequency: Daily  Progress: 0 Modality: individual  Related Interventions Discuss how generalized anxiety typically involves excessive worry about unrealistic threats, various bodily expressions of tension, overarousal, and hypervigilance, and avoidance of what is threatening that interact to maintain the problem (see Mastery of Your Anxiety and Worry: Therapist Guide by Corky Mull, and Barlow; Treating Generalized Anxiety Disorder by Rygh and Amparo Bristol). Discuss how treatment targets worry, anxiety symptoms, and avoidance to help the client manage worry effectively, reduce overarousal, and eliminate unnecessary avoidance. Objective Learn and implement calming skills to reduce overall anxiety and manage anxiety symptoms. Target Date: 2022-03-14 Frequency: Daily  Progress: 0 Modality: individual  Related Interventions Teach the client calming/relaxation skills (e.g., applied relaxation, progressive muscle relaxation, cue controlled relaxation; mindful breathing; biofeedback) and how to discriminate better between relaxation and tension; teach the client how to apply these skills to his/her daily life (e.g., New Directions in Progressive Muscle Relaxation by Casper Harrison, and Hazlett-Stevens; Treating Generalized Anxiety Disorder by Rygh and Amparo Bristol). Assign the client homework each session in which he/she practices relaxation exercises daily, gradually applying them progressively from non-anxiety-provoking to anxiety-provoking situations; review and reinforce success while providing corrective feedback toward improvement. Objective Verbalize an understanding of the role that cognitive biases play in excessive irrational worry and persistent anxiety symptoms. Target Date: 2022-03-14 Frequency: Daily  Progress: 0 Modality: individual  Related Interventions Discuss examples demonstrating that unrealistic worry typically overestimates the  probability of  threats and underestimates or overlooks the client's ability to manage realistic demands (or assign "Past Successful Anxiety Coping" in the Adult Psychotherapy Homework Planner by Bryn Gulling). Assist the client in analyzing his/her worries by examining potential biases such as the probability of the negative expectation occurring, the real consequences of it occurring, his/her ability to control the outcome, the worst possible outcome, and his/her ability to accept it (see "Analyze the Probability of a Feared Event" in the Adult Psychotherapy Homework Planner by Bryn Gulling; Cognitive Therapy of Anxiety Disorders by Alison Stalling). Objective Identify, challenge, and replace biased, fearful self-talk with positive, realistic, and empowering self-talk. Target Date: 2022-03-14 Frequency: Daily  Progress: 0 Modality: individual  Related Interventions Explore the client's schema and self-talk that mediate his/her fear response; assist him/her in challenging the biases; replace the distorted messages with reality-based alternatives and positive, realistic self-talk that will increase his/her self-confidence in coping with irrational fears (see Cognitive Therapy of Anxiety Disorders by Alison Stalling). Assign the client a homework exercise in which he/she identifies fearful self-talk, identifies biases in the self-talk, generates alternatives, and tests through behavioral experiments (or assign "Negative Thoughts Trigger Negative Feelings" in the Adult Psychotherapy Homework Planner by [facility]); review and reinforce success, providing corrective feedback toward improvement. Objective Identify and engage in pleasant activities on a daily basis. Target Date: 2022-03-14 Frequency: Daily  Progress: 0 Modality: individual  Related Interventions Engage the client in behavioral activation, increasing the client's contact with sources of reward, identifying processes that inhibit activation, and teaching  skills to solve life problems (or assign "Identify and Schedule Pleasant Activities" in the Adult Psychotherapy Homework Planner by Jongsma); use behavioral techniques such as instruction, rehearsal, role-playing, role reversal as needed to assist adoption into the client's daily life; reinforce success. Objective Learn and implement relapse prevention strategies for managing possible future anxiety symptoms. Target Date: 2022-03-14 Frequency: Daily  Progress: 0 Modality: individual  Related Interventions Discuss with the client the distinction between a lapse and relapse, associating a lapse with an initial and reversible return of worry, anxiety symptoms, or urges to avoid, and relapse with the decision to continue the fearful and avoidant patterns. Identify and rehearse with the client the management of future situations or circumstances in which lapses could occur. Develop a "coping card" on which coping strategies and other important information (e.g., "Breathe deeply and relax," "Challenge unrealistic worries," "Use problem-solving") are written for the client's later use. Instruct the client to routinely use new therapeutic skills (e.g., relaxation, cognitive restructuring, exposure, and problem-solving) in daily life to address emergent worries, anxiety, and avoidant tendencies. Objective Learn to accept limitations in life and commit to tolerating, rather than avoiding, unpleasant emotions while accomplishing meaningful goals. Target Date: 2022-03-14 Frequency: Daily  Progress: 0 Modality: individual  Related Interventions Use techniques from Acceptance and Commitment Therapy to help client accept uncomfortable realities such as lack of complete control, imperfections, and uncertainty and tolerate unpleasant emotions and thoughts in order to accomplish value-consistent goals.  Diagnosis:Major depressive disorder, recurrent episode, moderate (HCC)  Anxiety disorder, unspecified  type  Plan:  -meet again on Friday, March 25, 2022 at 8am.

## 2022-03-17 ENCOUNTER — Encounter: Payer: Self-pay | Admitting: Gastroenterology

## 2022-03-25 ENCOUNTER — Ambulatory Visit: Payer: 59 | Admitting: Professional

## 2022-03-28 ENCOUNTER — Encounter: Payer: Self-pay | Admitting: Professional

## 2022-03-28 ENCOUNTER — Ambulatory Visit: Payer: 59 | Admitting: Professional

## 2022-03-28 DIAGNOSIS — F419 Anxiety disorder, unspecified: Secondary | ICD-10-CM | POA: Diagnosis not present

## 2022-03-28 DIAGNOSIS — F331 Major depressive disorder, recurrent, moderate: Secondary | ICD-10-CM | POA: Diagnosis not present

## 2022-03-28 NOTE — Progress Notes (Addendum)
[facility] Counselor/Therapist Progress Note  Patient ID: [name] Brown, MRN: [id number],    Date: 03/28/2022  Time Spent: 54 minutes 10-1054am  Treatment Type: Individual Therapy  Risk Assessment: Danger to Self:  No Self-injurious Behavior: No Danger to Others: No Duty to Warn:no  Subjective: This session was held via audio teletherapy The patient consented to a phone call and was located in his truck during this session. He is aware it is the responsibility of the patient to secure confidentiality on his end of the session. The provider was in a private home office for the duration of this session.    The patient arrived on time for his telephone session.   Issues addressed:  1-medication-completed -had ran of of refills and missed two days -talked with PCP about increasing meds and he agreed to do so   -pt is now taking 35m Citalopram 2-mood-improving    03/28/2022   10:02 AM 03/03/2022    1:21 PM  Depression screen PHQ 2/9  Decreased Interest 2 3  Down, Depressed, Hopeless 2 3  PHQ - 2 Score 4 6  Altered sleeping 1 1  Tired, decreased energy 2 1  Change in appetite 1 3  Feeling bad or failure about yourself  1 3  Trouble concentrating 3 2  Moving slowly or fidgety/restless 1 1  Suicidal thoughts 0 0  PHQ-9 Score 13 17  Difficult doing work/chores  Somewhat difficult  3-professional a-reviewed how he managed confronted employee [employer]ne [employer]-discussed alternative conversation styles -avoiding angry and challenging posturing c-pt admits angry with boss [employer]-pt admittedly did not find time to participate in interviewing 4-personal a-importance of social activities b-reduce time spent alone -walking before going home c-18 months sobriety -got active in kayaking  Treatment Plan Problems Addressed  Anxiety, Depression, Low Self-Esteem, Medical Issues  Goals 1. Accept the illness, and adapt life to the necessary limitations. 2.  Alleviate depressive symptoms and return to previous level of effective functioning. Objective Verbalize an accurate understanding of depression. Target Date: 2023-03-14 Frequency: weekly  Progress: 0 Modality: individual  Related Interventions Consistent with the treatment model, discuss how cognitive, behavioral, interpersonal, and/or other factors (e.g., family history) contribute to depression. Objective Identify and replace thoughts and beliefs that support depression. Target Date: 2023-03-14 Frequency: weekly  Progress: 0 Modality: individual  Related Interventions Assign the client to self-monitor thoughts, feelings, and actions in daily journal (e.g., "Negative Thoughts Trigger Negative Feelings" in the Adult Psychotherapy Homework Planner by JBryn Gulling "Daily Record of Dysfunctional Thoughts" in Cognitive Therapy of Depression by BLupita Shutter and EWarren Lacy; process the journal material to challenge depressive thinking patterns and replace them with reality-based thoughts. Assign "behavioral experiments" in which depressive automatic thoughts are treated as hypotheses/prediction, reality-based alternative hypotheses/prediction are generated, and both are tested against the client's past, present, and/or future experiences. Facilitate and reinforce the client's shift from biased depressive self-talk and beliefs to reality-based cognitive messages that enhance self-confidence and increase adaptive actions (see "Positive Self-Talk" in the Adult Psychotherapy Homework Planner by JBryn Gulling. Objective Learn and implement behavioral strategies to overcome depression. Target Date: 2023-03-14 Frequency: weekly  Progress: 0 Modality: individual  Related Interventions Engage the client in "behavioral activation," increasing his/her activity level and contact with sources of reward, while identifying processes that inhibit activation (see Behavioral Activation for Depression by MBeverly Gust Dimidjian, and  Herman-Dunn; or assign "Identify and Schedule Pleasant Activities" in the Adult Psychotherapy Homework Planner by [facility]; use behavioral techniques such as instruction,  rehearsal, role-playing, role reversal, as needed, to facilitate activity in the client's daily life; reinforce success. Assist the client in developing skills that increase the likelihood of deriving pleasure from behavioral activation (e.g., assertiveness skills, developing an exercise plan, less internal/more external focus, increased social involvement); reinforce success. Objective Learn and implement problem-solving and decision-making skills. Target Date: 2023-03-14 Frequency: weekly  Progress: 0 Modality: individual  Related Interventions Encourage in the client the development of a positive problem orientation in which problems and solving them are viewed as a natural part of life and not something to be feared, despaired, or avoided. Conduct Problem-Solving Therapy (see Problem-Solving Therapy by Shawnee Knapp and Delane Ginger) using techniques such as psychoeducation, modeling, and role-playing to teach client problem-solving skills (i.e., defining a problem specifically, generating possible solutions, evaluating the pros and cons of each solution, selecting and implementing a plan of action, evaluating the efficacy of the plan, accepting or revising the plan); role-play application of the problem-solving skill to a real life issue (or assign "Applying Problem-Solving to Interpersonal Conflict" in the Adult Psychotherapy Homework Planner by Bryn Gulling). Objective Verbalize an understanding of healthy and unhealthy emotions with the intent of increasing the use of healthy emotions to guide actions. Target Date: 2023-03-14 Frequency: weekly  Progress: 0 Modality: individual  Related Interventions Use a process-experiential approach consistent with Emotion-Focused Therapy to create a safe, nurturing environment in which the client can  process emotions, learning to identify and regulate unhealthy feelings and to generate more adaptive ones that then guide actions (see Emotion-Focused Therapy for Depression by Andrey Spearman). Objective Verbalize insight into how past relationships may be influencing current experiences with depression. Target Date: 2023-03-14 Frequency: weekly  Progress: 0 Modality: individual  Related Interventions Explore experiences from the client's childhood that contribute to current depressed state. Explain a connection between previously unexpressed (repressed) feelings of anger (and helplessness) and current state of depression. Objective Increasingly verbalize hopeful and positive statements regarding self, others, and the future. Target Date: 2023-03-14 Frequency: weekly  Progress: 0 Modality: individual  Related Interventions Teach the client more about depression and how to recognize and accept some sadness as a normal variation in feeling. Assign the client to write at least one positive affirmation statement daily regarding himself/herself and the future (or assign "Positive Self-Talk" in the Adult Psychotherapy Homework Planner by Bryn Gulling). Objective Verbalize an understanding and resolution of current interpersonal problems. Target Date: 2023-03-14 Frequency: weekly  Progress: 0 Modality: individual  Related Interventions For role transitions (e.g., beginning or ending a relationship or career, moving, promotion, retirement, graduation), help the client mourn the loss of the old role while recognizing positive and negative aspects of the new role, and taking steps to gain mastery over the new role. For interpersonal deficits, help the client develop new interpersonal skills and relationships. 3. Appropriately grieve the loss in order to normalize mood and to return to previously adaptive level of functioning. 4. Become as knowledgeable as possible about the diagnosed condition and  about living as normally as possible. 5. Develop a consistent, positive self-image. 6. Develop healthy interpersonal relationships that lead to the alleviation and help prevent the relapse of depression. 7. Develop healthy thinking patterns and beliefs about self, others, and the world that lead to the alleviation and help prevent the relapse of depression. 8. Elevate self-esteem. Objective Increase insight into the historical and current sources of low self-esteem. Target Date: 2023-03-14 Frequency: weekly  Progress: 0 Modality: individual  Related Interventions Help the client become aware of his/her  fear of rejection and its connection with past rejection or abandonment experiences; begin to contrast past experiences of pain with present experiences of acceptance and competence. Objective Decrease the frequency of negative self-descriptive statements and increase frequency of positive self-descriptive statements. Target Date: 2023-03-14 Frequency: weekly  Progress: 0 Modality: individual  Related Interventions Assist the client in becoming aware of how he/she expresses or acts out negative feelings about himself/herself. Help the client reframe his/her negative assessment of himself/herself. Assist the client in developing positive self-talk as a way of boosting his/her confidence and self-image (or assign "Positive Self-Talk" in the Adult Psychotherapy Homework Planner by Bryn Gulling). Objective Identify and replace negative self-talk messages used to reinforce low self-esteem. Target Date: 2023-03-14 Frequency: weekly  Progress: 0 Modality: individual  Related Interventions Help the client identify his/her distorted, negative beliefs about self and the world and replace these messages with more realistic, affirmative messages (or assign "Journal and Replace Self-Defeating Thoughts" in the Adult Psychotherapy Homework Planner by [facility] or read What to Say When You Talk to Yourself by  Helmstetter). Objective Form realistic, appropriate, and attainable goals for self in all areas of life. Target Date: 2023-03-14 Frequency: weekly  Progress: 0 Modality: individual  Related Interventions Help the client analyze his/her goals to make sure they are realistic and attainable. Assign the client to make a list of goals for various areas of life and a plan for steps toward goal attainment. Objective Positively acknowledge verbal compliments from others. Target Date: 2023-03-14 Frequency: weekly  Progress: 0 Modality: individual  Related Interventions Assign the client to be aware of and acknowledge graciously (without discounting) praise and compliments from others. Objective Demonstrate an increased ability to identify and express personal feelings. Target Date: 2023-03-14 Frequency: weekly  Progress: 0 Modality: individual  Related Interventions Assist the client in identifying and labeling emotions. Objective Identify positive traits and talents about self. Target Date: 2023-03-14 Frequency: weekly  Progress: 0 Modality: individual  Related Interventions Assign the client the exercise of identifying his/her positive physical characteristics in a mirror to help him/her become more comfortable with himself/herself. Objective Articulate a plan to be proactive in trying to get identified needs met. Target Date: 2023-03-14 Frequency: weekly  Progress: 0 Modality: individual  Related Interventions Assist the client in identifying and verbalizing his/her needs, met and unmet. Assist the client in developing a specific action plan to get each need met (or assign "Satisfying Unmet Emotional Needs" in the Adult Psychotherapy Homework Planner by [facility]). 9. Enhance ability to effectively cope with the full variety of life's worries and anxieties. 10. Establish an inward sense of self-worth, confidence, and competence. 11. Interact socially without undue distress or  disability. 12. Learn and implement coping skills that result in a reduction of anxiety and worry, and improved daily functioning. 13. Medically stabilize physical condition. Objective Identify the losses or limitations that have been experienced due to the medical condition. Target Date: 2023-03-14 Frequency: weekly  Progress: 0 Modality: individual  Related Interventions Ask the client to list the changes, losses, or limitations that have resulted from the medical condition (or assign "The Impact of My Illness" in the Adult Psychotherapy Homework Planner by Bryn Gulling). Objective Engage in social, productive, and recreational activities that are possible in spite of medical condition. Target Date: 2023-03-14 Frequency: weekly  Progress: 0 Modality: individual  Related Interventions Sort out with the client activities that he/she can still enjoy either alone or with others (or assign "Identify and Schedule Pleasant Activities" in the Adult Psychotherapy Homework  Planner by Bryn Gulling). Solicit a commitment from the client to increase his/her activity level by engaging in enjoyable and challenging activities; reinforce such engagement. Objective Learn and implement skills for preventing lapses back into more stressful reactions. Target Date: 2023-03-14 Frequency: weekly  Progress: 0 Modality: individual  Related Interventions Teach the client relapse prevention skills including distinguishing between a lapse and relapse, identifying and rehearsing the management of high-risk situations using skills learned in therapy, building a less stressful lifestyle, and periodically attending "booster" sessions of therapy. Objective Demonstrate mastery of coping skills by applying them to daily life situations. Target Date: 2023-03-14 Frequency: weekly  Progress: 0 Modality: individual  Related Interventions Encourage skill development by having the client rehearse and practice coping skills in session  through imaginal and/or behavioral rehearsal. Facilitate generalization of skills into everyday life by assigning homework (e.g., "Plan Before Acting" or "Journal and Replace Self-Defeating Thoughts" in the Adult Psychotherapy Homework Planner by Bryn Gulling) in which the patient applies coping skills in graduating more demanding stressful situations; review, reinforcing success and problem-solving obstacles toward effective use of skills. Help the client internalize his/her new skill set and build self-efficacy by ensuring that the client "takes credit" for improvement and makes self-attributions for change. Objective Learn and implement skills for managing stress. Target Date: 2023-03-14 Frequency: weekly  Progress: 0 Modality: individual  Related Interventions Conduct skills training, building upon effective coping strategies the client possesses, and teaching new skills tailored to the specific stressor. Train problem-focused personal and interpersonal coping skills (e.g., problem-solving, communication, conflict resolution, accessing social supports). Train emotionally focused coping skills (e.g., calming skills, perspective taking, emotional regulation, cognitive reframing). Objective Work with therapist to develop a plan for coping with stress. Target Date: 2023-03-14 Frequency: weekly  Progress: 0 Modality: individual  Related Interventions Assist the client in developing a tailored coping action plan for preventing and/or managing identified stressful reactions using skills such as relaxation, exercise, cognitive reframing, and problem-solving. Objective Verbalize an increased understanding of the steps to grieving the losses brought on by the medical condition. Target Date: 2023-03-14 Frequency: weekly  Progress: 0 Modality: individual  Related Interventions Educate the client on the stages of the grieving process and answer any questions that he/she may have. 14. Recognize, accept, and  cope with feelings of depression. 15. Reduce fear, anxiety, and worry associated with the medical condition. 16. Reduce overall frequency, intensity, and duration of the anxiety so that daily functioning is not impaired. 17. Resolve the core conflict that is the source of anxiety. 18. Stabilize anxiety level while increasing ability to function on a daily basis. Objective Describe situations, thoughts, feelings, and actions associated with anxieties and worries, their impact on functioning, and attempts to resolve them. Target Date: 2023-03-14 Frequency: weekly  Progress: 0 Modality: individual  Related Interventions Ask the client to describe his/her past experiences of anxiety and their impact on functioning; assess the focus, excessiveness, and uncontrollability of the worry and the type, frequency, intensity, and duration of his/her anxiety symptoms (consider using a structured interview such as The Anxiety Disorders Interview Schedule-Adult Version). Objective Verbalize an understanding of the cognitive, physiological, and behavioral components of anxiety and its treatment. Target Date: 2023-03-14 Frequency: weekly  Progress: 0 Modality: individual  Related Interventions Discuss how generalized anxiety typically involves excessive worry about unrealistic threats, various bodily expressions of tension, overarousal, and hypervigilance, and avoidance of what is threatening that interact to maintain the problem (see Mastery of Your Anxiety and Worry: Therapist Guide by Hans Eden;  Treating Generalized Anxiety Disorder by Rygh and Amparo Bristol). Discuss how treatment targets worry, anxiety symptoms, and avoidance to help the client manage worry effectively, reduce overarousal, and eliminate unnecessary avoidance. Objective Learn and implement calming skills to reduce overall anxiety and manage anxiety symptoms. Target Date: 2023-03-14 Frequency: weekly  Progress: 0 Modality:  individual  Related Interventions Teach the client calming/relaxation skills (e.g., applied relaxation, progressive muscle relaxation, cue controlled relaxation; mindful breathing; biofeedback) and how to discriminate better between relaxation and tension; teach the client how to apply these skills to his/her daily life (e.g., New Directions in Progressive Muscle Relaxation by Casper Harrison, and Hazlett-Stevens; Treating Generalized Anxiety Disorder by Rygh and Amparo Bristol). Assign the client homework each session in which he/she practices relaxation exercises daily, gradually applying them progressively from non-anxiety-provoking to anxiety-provoking situations; review and reinforce success while providing corrective feedback toward improvement. Objective Verbalize an understanding of the role that cognitive biases play in excessive irrational worry and persistent anxiety symptoms. Target Date: 2023-03-14 Frequency: weekly  Progress: 0 Modality: individual  Related Interventions Discuss examples demonstrating that unrealistic worry typically overestimates the probability of threats and underestimates or overlooks the client's ability to manage realistic demands (or assign "Past Successful Anxiety Coping" in the Adult Psychotherapy Homework Planner by [facility]). Assist the client in analyzing his/her worries by examining potential biases such as the probability of the negative expectation occurring, the real consequences of it occurring, his/her ability to control the outcome, the worst possible outcome, and his/her ability to accept it (see "Analyze the Probability of a Feared Event" in the Adult Psychotherapy Homework Planner by Bryn Gulling; Cognitive Therapy of Anxiety Disorders by Alison Stalling). Objective Identify, challenge, and replace biased, fearful self-talk with positive, realistic, and empowering self-talk. Target Date: 2023-03-14 Frequency: weekly  Progress: 0 Modality: individual   Related Interventions Explore the client's schema and self-talk that mediate his/her fear response; assist him/her in challenging the biases; replace the distorted messages with reality-based alternatives and positive, realistic self-talk that will increase his/her self-confidence in coping with irrational fears (see Cognitive Therapy of Anxiety Disorders by Alison Stalling). Assign the client a homework exercise in which he/she identifies fearful self-talk, identifies biases in the self-talk, generates alternatives, and tests through behavioral experiments (or assign "Negative Thoughts Trigger Negative Feelings" in the Adult Psychotherapy Homework Planner by [facility]); review and reinforce success, providing corrective feedback toward improvement. Objective Identify and engage in pleasant activities on a daily basis. Target Date: 2023-03-14 Frequency: weekly  Progress: 0 Modality: individual  Related Interventions Engage the client in behavioral activation, increasing the client's contact with sources of reward, identifying processes that inhibit activation, and teaching skills to solve life problems (or assign "Identify and Schedule Pleasant Activities" in the Adult Psychotherapy Homework Planner by Jongsma); use behavioral techniques such as instruction, rehearsal, role-playing, role reversal as needed to assist adoption into the client's daily life; reinforce success. Objective Learn and implement relapse prevention strategies for managing possible future anxiety symptoms. Target Date: 2023-03-14 Frequency: weekly  Progress: 0 Modality: individual  Related Interventions Discuss with the client the distinction between a lapse and relapse, associating a lapse with an initial and reversible return of worry, anxiety symptoms, or urges to avoid, and relapse with the decision to continue the fearful and avoidant patterns. Identify and rehearse with the client the management of future situations or  circumstances in which lapses could occur. Develop a "coping card" on which coping strategies and other important information (e.g., "Breathe deeply and relax," "Challenge unrealistic worries," "  Use problem-solving") are written for the client's later use. Instruct the client to routinely use new therapeutic skills (e.g., relaxation, cognitive restructuring, exposure, and problem-solving) in daily life to address emergent worries, anxiety, and avoidant tendencies. Objective Learn to accept limitations in life and commit to tolerating, rather than avoiding, unpleasant emotions while accomplishing meaningful goals. Target Date: 2023-03-14 Frequency: weekly  Progress: 0 Modality: individual  Related Interventions Use techniques from Acceptance and Commitment Therapy to help client accept uncomfortable realities such as lack of complete control, imperfections, and uncertainty and tolerate unpleasant emotions and thoughts in order to accomplish value-consistent goals.  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

## 2022-03-31 ENCOUNTER — Ambulatory Visit: Payer: 59 | Admitting: Professional

## 2022-04-07 ENCOUNTER — Encounter: Payer: Self-pay | Admitting: Professional

## 2022-04-07 ENCOUNTER — Ambulatory Visit (INDEPENDENT_AMBULATORY_CARE_PROVIDER_SITE_OTHER): Payer: 59 | Admitting: Professional

## 2022-04-07 DIAGNOSIS — F331 Major depressive disorder, recurrent, moderate: Secondary | ICD-10-CM | POA: Diagnosis not present

## 2022-04-07 DIAGNOSIS — F411 Generalized anxiety disorder: Secondary | ICD-10-CM

## 2022-04-07 NOTE — Progress Notes (Signed)
[place] Counselor/Therapist Progress Note  Patient ID: [name] Brown, MRN: [id number],    Date: 04/07/2022  Time Spent: 61 minutes 1001-1102am  Treatment Type: Individual Therapy  Risk Assessment: Danger to Self:  No Self-injurious Behavior: No Danger to Others: No Duty to Warn:no  Subjective: This session was held via audio teletherapy The patient consented to a phone call and was located in his truck during this session. He is aware it is the responsibility of the patient to secure confidentiality on his end of the session. The provider was in a private home office for the duration of this session.    The patient arrived on time for his telephone session.   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 Accept the illness, and adapt life to the necessary limitations. 2. Alleviate depressive symptoms and return to previous level of effective functioning. Objective Verbalize an accurate understanding of depression. Target Date: 2023-03-14 Frequency: weekly  Progress: 0 Modality: individual  Related Interventions Consistent with the treatment model, discuss how cognitive, behavioral, interpersonal, and/or other factors (e.g., family history) contribute to depression. Objective Identify and replace thoughts and beliefs that support depression. Target Date: 2023-03-14 Frequency: weekly  Progress: 0 Modality: individual  Related Interventions Assign the client to  self-monitor thoughts, feelings, and actions in daily journal (e.g., "Negative Thoughts Trigger Negative Feelings" in the Adult Psychotherapy Homework Planner by Bryn Gulling; "Daily Record of Dysfunctional Thoughts" in Cognitive Therapy of Depression by Lupita Shutter, and Warren Lacy); process the journal material to challenge depressive thinking patterns and replace them with reality-based thoughts. Assign "behavioral experiments" in which depressive automatic thoughts are treated as hypotheses/prediction, reality-based alternative hypotheses/prediction are generated, and both are tested against the client's past, present, and/or future experiences. Facilitate and reinforce the client's shift from biased depressive self-talk and beliefs to reality-based cognitive messages that enhance self-confidence and increase adaptive actions (see "Positive Self-Talk" in the Adult Psychotherapy Homework Planner by Bryn Gulling). Objective Learn and implement behavioral strategies to overcome depression. Target Date: 2023-03-14 Frequency: weekly  Progress: 0 Modality: individual  Related Interventions Engage the client in "behavioral activation," increasing his/her activity level and contact with sources of reward, while identifying processes that inhibit activation (see Behavioral Activation for Depression by Beverly Gust, Dimidjian, and Herman-Dunn; or assign "Identify and Schedule Pleasant Activities" in the Adult Psychotherapy Homework Planner by [facility]); use behavioral techniques such as instruction, rehearsal, role-playing, role reversal, as needed, to facilitate activity in the client's daily life; reinforce success. Assist the client in developing skills that increase the likelihood of deriving pleasure from behavioral activation (e.g., assertiveness skills, developing an exercise plan, less internal/more external focus, increased social involvement); reinforce success. Objective Learn and implement problem-solving and  decision-making skills. Target Date: 2023-03-14 Frequency: weekly  Progress: 0 Modality: individual  Related Interventions Encourage in the client the development of a positive problem orientation in which problems and solving them are viewed as a natural part of life and not something to be feared, despaired, or avoided. Conduct Problem-Solving Therapy (see Problem-Solving Therapy by Shawnee Knapp  and Nezu) using techniques such as psychoeducation, modeling, and role-playing to teach client problem-solving skills (i.e., defining a problem specifically, generating possible solutions, evaluating the pros and cons of each solution, selecting and implementing a plan of action, evaluating the efficacy of the plan, accepting or revising the plan); role-play application of the problem-solving skill to a real life issue (or assign "Applying Problem-Solving to Interpersonal Conflict" in the Adult Psychotherapy Homework Planner by Bryn Gulling). Objective Verbalize an understanding of healthy and unhealthy emotions with the intent of increasing the use of healthy emotions to guide actions. Target Date: 2023-03-14 Frequency: weekly  Progress: 0 Modality: individual  Related Interventions Use a process-experiential approach consistent with Emotion-Focused Therapy to create a safe, nurturing environment in which the client can process emotions, learning to identify and regulate unhealthy feelings and to generate more adaptive ones that then guide actions (see Emotion-Focused Therapy for Depression by Andrey Spearman). Objective Verbalize insight into how past relationships may be influencing current experiences with depression. Target Date: 2023-03-14 Frequency: weekly  Progress: 0 Modality: individual  Related Interventions Explore experiences from the client's childhood that contribute to current depressed state. Explain a connection between previously unexpressed (repressed) feelings of anger (and helplessness)  and current state of depression. Objective Increasingly verbalize hopeful and positive statements regarding self, others, and the future. Target Date: 2023-03-14 Frequency: weekly  Progress: 0 Modality: individual  Related Interventions Teach the client more about depression and how to recognize and accept some sadness as a normal variation in feeling. Assign the client to write at least one positive affirmation statement daily regarding himself/herself and the future (or assign "Positive Self-Talk" in the Adult Psychotherapy Homework Planner by Bryn Gulling). Objective Verbalize an understanding and resolution of current interpersonal problems. Target Date: 2023-03-14 Frequency: weekly  Progress: 0 Modality: individual  Related Interventions For role transitions (e.g., beginning or ending a relationship or career, moving, promotion, retirement, graduation), help the client mourn the loss of the old role while recognizing positive and negative aspects of the new role, and taking steps to gain mastery over the new role. For interpersonal deficits, help the client develop new interpersonal skills and relationships. 3. Appropriately grieve the loss in order to normalize mood and to return to previously adaptive level of functioning. 4. Become as knowledgeable as possible about the diagnosed condition and about living as normally as possible. 5. Develop a consistent, positive self-image. 6. Develop healthy interpersonal relationships that lead to the alleviation and help prevent the relapse of depression. 7. Develop healthy thinking patterns and beliefs about self, others, and the world that lead to the alleviation and help prevent the relapse of depression. 8. Elevate self-esteem. Objective Increase insight into the historical and current sources of low self-esteem. Target Date: 2023-03-14 Frequency: weekly  Progress: 0 Modality: individual  Related Interventions Help the client become aware of  his/her fear of rejection and its connection with past rejection or abandonment experiences; begin to contrast past experiences of pain with present experiences of acceptance and competence. Objective Decrease the frequency of negative self-descriptive statements and increase frequency of positive self-descriptive statements. Target Date: 2023-03-14 Frequency: weekly  Progress: 0 Modality: individual  Related Interventions Assist the client in becoming aware of how he/she expresses or acts out negative feelings about himself/herself. Help the client reframe his/her negative assessment of himself/herself. Assist the client in developing positive self-talk as a way of boosting his/her confidence and self-image (or assign "Positive Self-Talk" in the Adult Psychotherapy Homework Planner by Bryn Gulling). Objective Identify and  replace negative self-talk messages used to reinforce low self-esteem. Target Date: 2023-03-14 Frequency: weekly  Progress: 0 Modality: individual  Related Interventions Help the client identify his/her distorted, negative beliefs about self and the world and replace these messages with more realistic, affirmative messages (or assign "Journal and Replace Self-Defeating Thoughts" in the Adult Psychotherapy Homework Planner by [facility] or read What to Say When You Talk to Yourself by Helmstetter). Objective Form realistic, appropriate, and attainable goals for self in all areas of life. Target Date: 2023-03-14 Frequency: weekly  Progress: 0 Modality: individual  Related Interventions Help the client analyze his/her goals to make sure they are realistic and attainable. Assign the client to make a list of goals for various areas of life and a plan for steps toward goal attainment. Objective Positively acknowledge verbal compliments from others. Target Date: 2023-03-14 Frequency: weekly  Progress: 0 Modality: individual  Related Interventions Assign the client to be aware of and  acknowledge graciously (without discounting) praise and compliments from others. Objective Demonstrate an increased ability to identify and express personal feelings. Target Date: 2023-03-14 Frequency: weekly  Progress: 0 Modality: individual  Related Interventions Assist the client in identifying and labeling emotions. Objective Identify positive traits and talents about self. Target Date: 2023-03-14 Frequency: weekly  Progress: 0 Modality: individual  Related Interventions Assign the client the exercise of identifying his/her positive physical characteristics in a mirror to help him/her become more comfortable with himself/herself. Objective Articulate a plan to be proactive in trying to get identified needs met. Target Date: 2023-03-14 Frequency: weekly  Progress: 0 Modality: individual  Related Interventions Assist the client in identifying and verbalizing his/her needs, met and unmet. Assist the client in developing a specific action plan to get each need met (or assign "Satisfying Unmet Emotional Needs" in the Adult Psychotherapy Homework Planner by [facility]). 9. Enhance ability to effectively cope with the full variety of life's worries and anxieties. 10. Establish an inward sense of self-worth, confidence, and competence. 11. Interact socially without undue distress or disability. 12. Learn and implement coping skills that result in a reduction of anxiety and worry, and improved daily functioning. 13. Medically stabilize physical condition. Objective Identify the losses or limitations that have been experienced due to the medical condition. Target Date: 2023-03-14 Frequency: weekly  Progress: 0 Modality: individual  Related Interventions Ask the client to list the changes, losses, or limitations that have resulted from the medical condition (or assign "The Impact of My Illness" in the Adult Psychotherapy Homework Planner by Bryn Gulling). Objective Engage in social, productive, and  recreational activities that are possible in spite of medical condition. Target Date: 2023-03-14 Frequency: weekly  Progress: 0 Modality: individual  Related Interventions Sort out with the client activities that he/she can still enjoy either alone or with others (or assign "Identify and Schedule Pleasant Activities" in the Adult Psychotherapy Homework Planner by Bryn Gulling). Solicit a commitment from the client to increase his/her activity level by engaging in enjoyable and challenging activities; reinforce such engagement. Objective Learn and implement skills for preventing lapses back into more stressful reactions. Target Date: 2023-03-14 Frequency: weekly  Progress: 0 Modality: individual  Related Interventions Teach the client relapse prevention skills including distinguishing between a lapse and relapse, identifying and rehearsing the management of high-risk situations using skills learned in therapy, building a less stressful lifestyle, and periodically attending "booster" sessions of therapy. Objective Demonstrate mastery of coping skills by applying them to daily life situations. Target Date: 2023-03-14 Frequency: weekly  Progress: 0 Modality: individual  Related Interventions Encourage skill development by having the client rehearse and practice coping skills in session through imaginal and/or behavioral rehearsal. Facilitate generalization of skills into everyday life by assigning homework (e.g., "Plan Before Acting" or "Journal and Replace Self-Defeating Thoughts" in the Adult Psychotherapy Homework Planner by Bryn Gulling) in which the patient applies coping skills in graduating more demanding stressful situations; review, reinforcing success and problem-solving obstacles toward effective use of skills. Help the client internalize his/her new skill set and build self-efficacy by ensuring that the client "takes credit" for improvement and makes self-attributions for change. Objective Learn  and implement skills for managing stress. Target Date: 2023-03-14 Frequency: weekly  Progress: 0 Modality: individual  Related Interventions Conduct skills training, building upon effective coping strategies the client possesses, and teaching new skills tailored to the specific stressor. Train problem-focused personal and interpersonal coping skills (e.g., problem-solving, communication, conflict resolution, accessing social supports). Train emotionally focused coping skills (e.g., calming skills, perspective taking, emotional regulation, cognitive reframing). Objective Work with therapist to develop a plan for coping with stress. Target Date: 2023-03-14 Frequency: weekly  Progress: 0 Modality: individual  Related Interventions Assist the client in developing a tailored coping action plan for preventing and/or managing identified stressful reactions using skills such as relaxation, exercise, cognitive reframing, and problem-solving. Objective Verbalize an increased understanding of the steps to grieving the losses brought on by the medical condition. Target Date: 2023-03-14 Frequency: weekly  Progress: 0 Modality: individual  Related Interventions Educate the client on the stages of the grieving process and answer any questions that he/she may have. 14. Recognize, accept, and cope with feelings of depression. 15. Reduce fear, anxiety, and worry associated with the medical condition. 16. Reduce overall frequency, intensity, and duration of the anxiety so that daily functioning is not impaired. 17. Resolve the core conflict that is the source of anxiety. 18. Stabilize anxiety level while increasing ability to function on a daily basis. Objective Describe situations, thoughts, feelings, and actions associated with anxieties and worries, their impact on functioning, and attempts to resolve them. Target Date: 2023-03-14 Frequency: weekly  Progress: 0 Modality: individual  Related  Interventions Ask the client to describe his/her past experiences of anxiety and their impact on functioning; assess the focus, excessiveness, and uncontrollability of the worry and the type, frequency, intensity, and duration of his/her anxiety symptoms (consider using a structured interview such as The Anxiety Disorders Interview Schedule-Adult Version). Objective Verbalize an understanding of the cognitive, physiological, and behavioral components of anxiety and its treatment. Target Date: 2023-03-14 Frequency: weekly  Progress: 0 Modality: individual  Related Interventions Discuss how generalized anxiety typically involves excessive worry about unrealistic threats, various bodily expressions of tension, overarousal, and hypervigilance, and avoidance of what is threatening that interact to maintain the problem (see Mastery of Your Anxiety and Worry: Therapist Guide by Corky Mull, and Barlow; Treating Generalized Anxiety Disorder by Rygh and Amparo Bristol). Discuss how treatment targets worry, anxiety symptoms, and avoidance to help the client manage worry effectively, reduce overarousal, and eliminate unnecessary avoidance. Objective Learn and implement calming skills to reduce overall anxiety and manage anxiety symptoms. Target Date: 2023-03-14 Frequency: weekly  Progress: 0 Modality: individual  Related Interventions Teach the client calming/relaxation skills (e.g., applied relaxation, progressive muscle relaxation, cue controlled relaxation; mindful breathing; biofeedback) and how to discriminate better between relaxation and tension; teach the client how to apply these skills to his/her daily life (e.g., New Directions in Progressive Muscle Relaxation by Casper Harrison, and Hazlett-Stevens; Treating Generalized Anxiety Disorder by  Rygh and Amparo Bristol). Assign the client homework each session in which he/she practices relaxation exercises daily, gradually applying them progressively from  non-anxiety-provoking to anxiety-provoking situations; review and reinforce success while providing corrective feedback toward improvement. Objective Verbalize an understanding of the role that cognitive biases play in excessive irrational worry and persistent anxiety symptoms. Target Date: 2023-03-14 Frequency: weekly  Progress: 0 Modality: individual  Related Interventions Discuss examples demonstrating that unrealistic worry typically overestimates the probability of threats and underestimates or overlooks the client's ability to manage realistic demands (or assign "Past Successful Anxiety Coping" in the Adult Psychotherapy Homework Planner by [facility]). Assist the client in analyzing his/her worries by examining potential biases such as the probability of the negative expectation occurring, the real consequences of it occurring, his/her ability to control the outcome, the worst possible outcome, and his/her ability to accept it (see "Analyze the Probability of a Feared Event" in the Adult Psychotherapy Homework Planner by Bryn Gulling; Cognitive Therapy of Anxiety Disorders by Alison Stalling). Objective Identify, challenge, and replace biased, fearful self-talk with positive, realistic, and empowering self-talk. Target Date: 2023-03-14 Frequency: weekly  Progress: 0 Modality: individual  Related Interventions Explore the client's schema and self-talk that mediate his/her fear response; assist him/her in challenging the biases; replace the distorted messages with reality-based alternatives and positive, realistic self-talk that will increase his/her self-confidence in coping with irrational fears (see Cognitive Therapy of Anxiety Disorders by Alison Stalling). Assign the client a homework exercise in which he/she identifies fearful self-talk, identifies biases in the self-talk, generates alternatives, and tests through behavioral experiments (or assign "Negative Thoughts Trigger Negative Feelings" in the  Adult Psychotherapy Homework Planner by [facility]); review and reinforce success, providing corrective feedback toward improvement. Objective Identify and engage in pleasant activities on a daily basis. Target Date: 2023-03-14 Frequency: weekly  Progress: 0 Modality: individual  Related Interventions Engage the client in behavioral activation, increasing the client's contact with sources of reward, identifying processes that inhibit activation, and teaching skills to solve life problems (or assign "Identify and Schedule Pleasant Activities" in the Adult Psychotherapy Homework Planner by Jongsma); use behavioral techniques such as instruction, rehearsal, role-playing, role reversal as needed to assist adoption into the client's daily life; reinforce success. Objective Learn and implement relapse prevention strategies for managing possible future anxiety symptoms. Target Date: 2023-03-14 Frequency: weekly  Progress: 0 Modality: individual  Related Interventions Discuss with the client the distinction between a lapse and relapse, associating a lapse with an initial and reversible return of worry, anxiety symptoms, or urges to avoid, and relapse with the decision to continue the fearful and avoidant patterns. Identify and rehearse with the client the management of future situations or circumstances in which lapses could occur. Develop a "coping card" on which coping strategies and other important information (e.g., "Breathe deeply and relax," "Challenge unrealistic worries," "Use problem-solving") are written for the client's later use. Instruct the client to routinely use new therapeutic skills (e.g., relaxation, cognitive restructuring, exposure, and problem-solving) in daily life to address emergent worries, anxiety, and avoidant tendencies. Objective Learn to accept limitations in life and commit to tolerating, rather than avoiding, unpleasant emotions while accomplishing meaningful goals. Target  Date: 2023-03-14 Frequency: weekly  Progress: 0 Modality: individual  Related Interventions Use techniques from Acceptance and Commitment Therapy to help client accept uncomfortable realities such as lack of complete control, imperfections, and uncertainty and tolerate unpleasant emotions and thoughts in order to accomplish value-consistent goals.  Diagnosis:Major depressive disorder, recurrent episode, moderate ([place])  Generalized anxiety disorder  Plan:  -meet again on Thursday, April 21, 2022 at 10am in-person.

## 2022-04-15 ENCOUNTER — Ambulatory Visit: Payer: 59 | Admitting: Professional

## 2022-04-21 ENCOUNTER — Ambulatory Visit (INDEPENDENT_AMBULATORY_CARE_PROVIDER_SITE_OTHER): Payer: 59 | Admitting: Professional

## 2022-04-21 ENCOUNTER — Encounter: Payer: Self-pay | Admitting: Professional

## 2022-04-21 DIAGNOSIS — F411 Generalized anxiety disorder: Secondary | ICD-10-CM | POA: Diagnosis not present

## 2022-04-21 DIAGNOSIS — F331 Major depressive disorder, recurrent, moderate: Secondary | ICD-10-CM

## 2022-04-21 NOTE — Progress Notes (Signed)
[place] Counselor/Therapist Progress Note  Patient ID: [name] Brown, MRN: [id number],    Date: 04/21/2022  Time Spent: 45 minutes 1001-1046am  Treatment Type: Individual Therapy  Risk Assessment: Danger to Self:  No Self-injurious Behavior: No Danger to Others: No Duty to Warn:no  Subjective: This session was held via audio teletherapy The patient consented to a phone call and was located in his truck during this session. He is aware it is the responsibility of the patient to secure confidentiality on his end of the session. The provider was in a private home office for the duration of this session.    The patient arrived on time for his telephone session.   Issues addressed:  1-positive -went to [place] to visit his parents -took ribs and chicken -he got there and table was set and his parents had invited several cousins 2-father -being admitted to get fluid removed from his heart -regrets times when he was out of touch   -reminded pt that parents are happy to see and hear from him whenever he make contact 3-anxiety and depression -still gets down when he spends too much time alone  Treatment Plan Problems Addressed  Anxiety, Depression, Low Self-Esteem, Medical Issues  Goals 1. Accept the illness, and adapt life to the necessary limitations. 2. Alleviate depressive symptoms and return to previous level of effective functioning. Objective Verbalize an accurate understanding of depression. Target Date: 2023-03-14 Frequency: biweekly  Progress: 50 Modality: individual  Related Interventions Consistent with the treatment model, discuss how cognitive, behavioral, interpersonal, and/or other factors (e.g., family history) contribute to depression. Objective Identify and replace thoughts and beliefs that support depression. Target Date: 2023-03-14 Frequency: biweekly  Progress: 50 Modality: individual  Related Interventions Assign the client to self-monitor  thoughts, feelings, and actions in daily journal (e.g., "Negative Thoughts Trigger Negative Feelings" in the Adult Psychotherapy Homework Planner by Jongsma; "Daily Record of Dysfunctional Thoughts" in Cognitive Therapy of Depression by Lupita Shutter, and Warren Lacy); process the journal material to challenge depressive thinking patterns and replace them with reality-based thoughts. Assign "behavioral experiments" in which depressive automatic thoughts are treated as hypotheses/prediction, reality-based alternative hypotheses/prediction are generated, and both are tested against the client's past, present, and/or future experiences. Facilitate and reinforce the client's shift from biased depressive self-talk and beliefs to reality-based cognitive messages that enhance self-confidence and increase adaptive actions (see "Positive Self-Talk" in the Adult Psychotherapy Homework Planner by Bryn Gulling). Objective Learn and implement behavioral strategies to overcome depression. Target Date: 2023-03-14 Frequency: biweekly  Progress: 30 Modality: individual  Related Interventions Engage the client in "behavioral activation," increasing his/her activity level and contact with sources of reward, while identifying processes that inhibit activation (see Behavioral Activation for Depression by Beverly Gust, Dimidjian, and Herman-Dunn; or assign "Identify and Schedule Pleasant Activities" in the Adult Psychotherapy Homework Planner by [facility]); use behavioral techniques such as instruction, rehearsal, role-playing, role reversal, as needed, to facilitate activity in the client's daily life; reinforce success. Assist the client in developing skills that increase the likelihood of deriving pleasure from behavioral activation (e.g., assertiveness skills, developing an exercise plan, less internal/more external focus, increased social involvement); reinforce success. Objective Learn and implement problem-solving and decision-making  skills. Target Date: 2023-03-14 Frequency: biweekly  Progress: 30 Modality: individual  Related Interventions Encourage in the client the development of a positive problem orientation in which problems and solving them are viewed as a natural part of life and not something to be feared, despaired, or avoided. Conduct Problem-Solving Therapy (  see Problem-Solving Therapy by Shawnee Knapp and Delane Ginger) using techniques such as psychoeducation, modeling, and role-playing to teach client problem-solving skills (i.e., defining a problem specifically, generating possible solutions, evaluating the pros and cons of each solution, selecting and implementing a plan of action, evaluating the efficacy of the plan, accepting or revising the plan); role-play application of the problem-solving skill to a real life issue (or assign "Applying Problem-Solving to Interpersonal Conflict" in the Adult Psychotherapy Homework Planner by Bryn Gulling). Objective Verbalize an understanding of healthy and unhealthy emotions with the intent of increasing the use of healthy emotions to guide actions. Target Date: 2023-03-14 Frequency: biweekly  Progress: 25 Modality: individual  Related Interventions Use a process-experiential approach consistent with Emotion-Focused Therapy to create a safe, nurturing environment in which the client can process emotions, learning to identify and regulate unhealthy feelings and to generate more adaptive ones that then guide actions (see Emotion-Focused Therapy for Depression by Andrey Spearman). Objective Verbalize insight into how past relationships may be influencing current experiences with depression. Target Date: 2023-03-14 Frequency: biweekly  Progress: 20 Modality: individual  Related Interventions Explore experiences from the client's childhood that contribute to current depressed state. Explain a connection between previously unexpressed (repressed) feelings of anger (and helplessness) and  current state of depression. Objective Increasingly verbalize hopeful and positive statements regarding self, others, and the future. Target Date: 2023-03-14 Frequency: biweekly  Progress: 30 Modality: individual  Related Interventions Teach the client more about depression and how to recognize and accept some sadness as a normal variation in feeling. Assign the client to write at least one positive affirmation statement daily regarding himself/herself and the future (or assign "Positive Self-Talk" in the Adult Psychotherapy Homework Planner by Bryn Gulling). Objective Verbalize an understanding and resolution of current interpersonal problems. Target Date: 2023-03-14 Frequency: biweekly  Progress: 30 Modality: individual  Related Interventions For role transitions (e.g., beginning or ending a relationship or career, moving, promotion, retirement, graduation), help the client mourn the loss of the old role while recognizing positive and negative aspects of the new role, and taking steps to gain mastery over the new role. For interpersonal deficits, help the client develop new interpersonal skills and relationships. 3. Appropriately grieve the loss in order to normalize mood and to return to previously adaptive level of functioning. 4. Become as knowledgeable as possible about the diagnosed condition and about living as normally as possible. 5. Develop a consistent, positive self-image. 6. Develop healthy interpersonal relationships that lead to the alleviation and help prevent the relapse of depression. 7. Develop healthy thinking patterns and beliefs about self, others, and the world that lead to the alleviation and help prevent the relapse of depression. 8. Elevate self-esteem. Objective Increase insight into the historical and current sources of low self-esteem. Target Date: 2023-03-14 Frequency: biweekly  Progress: 25 Modality: individual  Related Interventions Help the client become aware  of his/her fear of rejection and its connection with past rejection or abandonment experiences; begin to contrast past experiences of pain with present experiences of acceptance and competence. Objective Decrease the frequency of negative self-descriptive statements and increase frequency of positive self-descriptive statements. Target Date: 2023-03-14 Frequency: biweekly  Progress: 25 Modality: individual  Related Interventions Assist the client in becoming aware of how he/she expresses or acts out negative feelings about himself/herself. Help the client reframe his/her negative assessment of himself/herself. Assist the client in developing positive self-talk as a way of boosting his/her confidence and self-image (or assign "Positive Self-Talk" in the Adult Psychotherapy Homework Planner  by Bryn Gulling). Objective Identify and replace negative self-talk messages used to reinforce low self-esteem. Target Date: 2023-03-14 Frequency: biweekly  Progress: 25 Modality: individual  Related Interventions Help the client identify his/her distorted, negative beliefs about self and the world and replace these messages with more realistic, affirmative messages (or assign "Journal and Replace Self-Defeating Thoughts" in the Adult Psychotherapy Homework Planner by [facility] or read What to Say When You Talk to Yourself by Helmstetter). Objective Form realistic, appropriate, and attainable goals for self in all areas of life. Target Date: 2023-03-14 Frequency: biweekly  Progress: 20 Modality: individual  Related Interventions Help the client analyze his/her goals to make sure they are realistic and attainable. Assign the client to make a list of goals for various areas of life and a plan for steps toward goal attainment. Objective Positively acknowledge verbal compliments from others. Target Date: 2023-03-14 Frequency: biweekly  Progress: 30 Modality: individual  Related Interventions Assign the client to be  aware of and acknowledge graciously (without discounting) praise and compliments from others. Objective Demonstrate an increased ability to identify and express personal feelings. Target Date: 2023-03-14 Frequency: biweekly  Progress: 35 Modality: individual  Related Interventions Assist the client in identifying and labeling emotions. Objective Identify positive traits and talents about self. Target Date: 2023-03-14 Frequency: biweekly  Progress: 50 Modality: individual  Related Interventions Assign the client the exercise of identifying his/her positive physical characteristics in a mirror to help him/her become more comfortable with himself/herself. Objective Articulate a plan to be proactive in trying to get identified needs met. Target Date: 2023-03-14 Frequency: biweekly  Progress: 25 Modality: individual  Related Interventions Assist the client in identifying and verbalizing his/her needs, met and unmet. Assist the client in developing a specific action plan to get each need met (or assign "Satisfying Unmet Emotional Needs" in the Adult Psychotherapy Homework Planner by [facility]). 9. Enhance ability to effectively cope with the full variety of life's worries and anxieties. 10. Establish an inward sense of self-worth, confidence, and competence. 11. Interact socially without undue distress or disability. 12. Learn and implement coping skills that result in a reduction of anxiety and worry, and improved daily functioning. 13. Medically stabilize physical condition. Objective Identify the losses or limitations that have been experienced due to the medical condition. Target Date: 2023-03-14 Frequency: biweekly  Progress: 20 Modality: individual  Related Interventions Ask the client to list the changes, losses, or limitations that have resulted from the medical condition (or assign "The Impact of My Illness" in the Adult Psychotherapy Homework Planner by Bryn Gulling). Objective Engage in  social, productive, and recreational activities that are possible in spite of medical condition. Target Date: 2023-03-14 Frequency: biweekly  Progress: 20 Modality: individual  Related Interventions Sort out with the client activities that he/she can still enjoy either alone or with others (or assign "Identify and Schedule Pleasant Activities" in the Adult Psychotherapy Homework Planner by Bryn Gulling). Solicit a commitment from the client to increase his/her activity level by engaging in enjoyable and challenging activities; reinforce such engagement. Objective Learn and implement skills for preventing lapses back into more stressful reactions. Target Date: 2023-03-14 Frequency: biweekly  Progress: 30 Modality: individual  Related Interventions Teach the client relapse prevention skills including distinguishing between a lapse and relapse, identifying and rehearsing the management of high-risk situations using skills learned in therapy, building a less stressful lifestyle, and periodically attending "booster" sessions of therapy. Objective Demonstrate mastery of coping skills by applying them to daily life situations. Target Date: 2023-03-14 Frequency: biweekly  Progress: 30 Modality: individual  Related Interventions Encourage skill development by having the client rehearse and practice coping skills in session through imaginal and/or behavioral rehearsal. Facilitate generalization of skills into everyday life by assigning homework (e.g., "Plan Before Acting" or "Journal and Replace Self-Defeating Thoughts" in the Adult Psychotherapy Homework Planner by Bryn Gulling) in which the patient applies coping skills in graduating more demanding stressful situations; review, reinforcing success and problem-solving obstacles toward effective use of skills. Help the client internalize his/her new skill set and build self-efficacy by ensuring that the client "takes credit" for improvement and makes  self-attributions for change. Objective Learn and implement skills for managing stress. Target Date: 2023-03-14 Frequency: biweekly  Progress: 40 Modality: individual  Related Interventions Conduct skills training, building upon effective coping strategies the client possesses, and teaching new skills tailored to the specific stressor. Train problem-focused personal and interpersonal coping skills (e.g., problem-solving, communication, conflict resolution, accessing social supports). Train emotionally focused coping skills (e.g., calming skills, perspective taking, emotional regulation, cognitive reframing). Objective Work with therapist to develop a plan for coping with stress. Target Date: 2023-03-14 Frequency: biweekly  Progress: 50 Modality: individual  Related Interventions Assist the client in developing a tailored coping action plan for preventing and/or managing identified stressful reactions using skills such as relaxation, exercise, cognitive reframing, and problem-solving. Objective Verbalize an increased understanding of the steps to grieving the losses brought on by the medical condition. Target Date: 2023-03-14 Frequency: biweekly  Progress: 40 Modality: individual  Related Interventions Educate the client on the stages of the grieving process and answer any questions that he/she may have. 14. Recognize, accept, and cope with feelings of depression. 15. Reduce fear, anxiety, and worry associated with the medical condition. 16. Reduce overall frequency, intensity, and duration of the anxiety so that daily functioning is not impaired. 17. Resolve the core conflict that is the source of anxiety. 18. Stabilize anxiety level while increasing ability to function on a daily basis. Objective Describe situations, thoughts, feelings, and actions associated with anxieties and worries, their impact on functioning, and attempts to resolve them. Target Date: 2023-03-14 Frequency: biweekly   Progress: 30 Modality: individual  Related Interventions Ask the client to describe his/her past experiences of anxiety and their impact on functioning; assess the focus, excessiveness, and uncontrollability of the worry and the type, frequency, intensity, and duration of his/her anxiety symptoms (consider using a structured interview such as The Anxiety Disorders Interview Schedule-Adult Version). Objective Verbalize an understanding of the cognitive, physiological, and behavioral components of anxiety and its treatment. Target Date: 2023-03-14 Frequency: biweekly  Progress: 30 Modality: individual  Related Interventions Discuss how generalized anxiety typically involves excessive worry about unrealistic threats, various bodily expressions of tension, overarousal, and hypervigilance, and avoidance of what is threatening that interact to maintain the problem (see Mastery of Your Anxiety and Worry: Therapist Guide by Corky Mull, and Barlow; Treating Generalized Anxiety Disorder by Rygh and Amparo Bristol). Discuss how treatment targets worry, anxiety symptoms, and avoidance to help the client manage worry effectively, reduce overarousal, and eliminate unnecessary avoidance. Objective Learn and implement calming skills to reduce overall anxiety and manage anxiety symptoms. Target Date: 2023-03-14 Frequency: biweekly  Progress: 35 Modality: individual  Related Interventions Teach the client calming/relaxation skills (e.g., applied relaxation, progressive muscle relaxation, cue controlled relaxation; mindful breathing; biofeedback) and how to discriminate better between relaxation and tension; teach the client how to apply these skills to his/her daily life (e.g., New Directions in Progressive Muscle Relaxation by Casper Harrison, and Hazlett-Stevens;  Treating Generalized Anxiety Disorder by Rygh and Amparo Bristol). Assign the client homework each session in which he/she practices relaxation exercises  daily, gradually applying them progressively from non-anxiety-provoking to anxiety-provoking situations; review and reinforce success while providing corrective feedback toward improvement. Objective Verbalize an understanding of the role that cognitive biases play in excessive irrational worry and persistent anxiety symptoms. Target Date: 2023-03-14 Frequency: biweekly  Progress: 60 Modality: individual  Related Interventions Discuss examples demonstrating that unrealistic worry typically overestimates the probability of threats and underestimates or overlooks the client's ability to manage realistic demands (or assign "Past Successful Anxiety Coping" in the Adult Psychotherapy Homework Planner by [facility]). Assist the client in analyzing his/her worries by examining potential biases such as the probability of the negative expectation occurring, the real consequences of it occurring, his/her ability to control the outcome, the worst possible outcome, and his/her ability to accept it (see "Analyze the Probability of a Feared Event" in the Adult Psychotherapy Homework Planner by Bryn Gulling; Cognitive Therapy of Anxiety Disorders by Alison Stalling). Objective Identify, challenge, and replace biased, fearful self-talk with positive, realistic, and empowering self-talk. Target Date: 2023-03-14 Frequency: biweekly  Progress: 30 Modality: individual  Related Interventions Explore the client's schema and self-talk that mediate his/her fear response; assist him/her in challenging the biases; replace the distorted messages with reality-based alternatives and positive, realistic self-talk that will increase his/her self-confidence in coping with irrational fears (see Cognitive Therapy of Anxiety Disorders by Alison Stalling). Assign the client a homework exercise in which he/she identifies fearful self-talk, identifies biases in the self-talk, generates alternatives, and tests through behavioral experiments (or  assign "Negative Thoughts Trigger Negative Feelings" in the Adult Psychotherapy Homework Planner by [facility]); review and reinforce success, providing corrective feedback toward improvement. Objective Identify and engage in pleasant activities on a daily basis. Target Date: 2023-03-14 Frequency: biweekly  Progress: 30 Modality: individual  Related Interventions Engage the client in behavioral activation, increasing the client's contact with sources of reward, identifying processes that inhibit activation, and teaching skills to solve life problems (or assign "Identify and Schedule Pleasant Activities" in the Adult Psychotherapy Homework Planner by Jongsma); use behavioral techniques such as instruction, rehearsal, role-playing, role reversal as needed to assist adoption into the client's daily life; reinforce success. Objective Learn and implement relapse prevention strategies for managing possible future anxiety symptoms. Target Date: 2023-03-14 Frequency: biweekly  Progress: 30 Modality: individual  Related Interventions Discuss with the client the distinction between a lapse and relapse, associating a lapse with an initial and reversible return of worry, anxiety symptoms, or urges to avoid, and relapse with the decision to continue the fearful and avoidant patterns. Identify and rehearse with the client the management of future situations or circumstances in which lapses could occur. Develop a "coping card" on which coping strategies and other important information (e.g., "Breathe deeply and relax," "Challenge unrealistic worries," "Use problem-solving") are written for the client's later use. Instruct the client to routinely use new therapeutic skills (e.g., relaxation, cognitive restructuring, exposure, and problem-solving) in daily life to address emergent worries, anxiety, and avoidant tendencies. Objective Learn to accept limitations in life and commit to tolerating, rather than avoiding,  unpleasant emotions while accomplishing meaningful goals. Target Date: 2023-03-14 Frequency: biweekly  Progress: 40 Modality: individual  Related Interventions Use techniques from Acceptance and Commitment Therapy to help client accept uncomfortable realities such as lack of complete control, imperfections, and uncertainty and tolerate unpleasant emotions and thoughts in order to accomplish value-consistent goals.  Diagnosis:Major depressive disorder,  recurrent episode, moderate (HCC)  Generalized anxiety disorder  Plan:  -meet again on Thursday, May 05, 2022 at 10am in-person.

## 2022-04-26 ENCOUNTER — Other Ambulatory Visit: Payer: Self-pay

## 2022-04-26 DIAGNOSIS — M109 Gout, unspecified: Secondary | ICD-10-CM

## 2022-04-26 NOTE — Telephone Encounter (Signed)
Last prescribed by Dr Sheppard Coil.

## 2022-04-27 MED ORDER — ALLOPURINOL 300 MG PO TABS: 300.0000 mg | ORAL_TABLET | Freq: Every day | ORAL | 3 refills | Status: DC

## 2022-04-27 MED ORDER — ATORVASTATIN CALCIUM 10 MG PO TABS
ORAL_TABLET | ORAL | 3 refills | Status: DC
Start: 2022-04-27 — End: 2023-01-23

## 2022-04-28 ENCOUNTER — Other Ambulatory Visit: Payer: Self-pay | Admitting: Gastroenterology

## 2022-04-28 DIAGNOSIS — Z8719 Personal history of other diseases of the digestive system: Secondary | ICD-10-CM

## 2022-05-05 ENCOUNTER — Encounter: Payer: Self-pay | Admitting: Professional

## 2022-05-05 ENCOUNTER — Ambulatory Visit (INDEPENDENT_AMBULATORY_CARE_PROVIDER_SITE_OTHER): Payer: 59 | Admitting: Professional

## 2022-05-05 DIAGNOSIS — F411 Generalized anxiety disorder: Secondary | ICD-10-CM

## 2022-05-05 DIAGNOSIS — F331 Major depressive disorder, recurrent, moderate: Secondary | ICD-10-CM | POA: Diagnosis not present

## 2022-05-05 NOTE — Progress Notes (Signed)
[place] Counselor/Therapist Progress Note  Patient ID: [name] Brown, MRN: [id number],    Date: 05/05/2022  Time Spent: 45 minutes 905-950am  Treatment Type: Individual Therapy  Risk Assessment: Danger to Self:  No Self-injurious Behavior: No Danger to Others: No Duty to Warn:no  Subjective: This session was held via audio teletherapy The patient consented to a phone call and was located in his truck during this session. He is aware it is the responsibility of the patient to secure confidentiality on his end of the session. The provider was in a private home office for the duration of this session.    The patient arrived late for his telephone session.   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   -looks at work as a second home 4-physical -has an endoscopy scheduled -curious to see what is going on -he admits to stomach pain -he admits he doesn't eat properly  Treatment Plan Problems Addressed  Anxiety, Depression, Low Self-Esteem, Medical Issues  Goals 1. Accept the illness, and adapt life to the necessary limitations. 2. Alleviate depressive symptoms and return to previous level of effective functioning. Objective Verbalize an accurate understanding of depression. Target Date: 2023-03-14 Frequency: biweekly  Progress: 50 Modality: individual  Related Interventions Consistent with the treatment model, discuss how cognitive,  behavioral, interpersonal, and/or other factors (e.g., family history) contribute to depression. Objective Identify and replace thoughts and beliefs that support depression. Target Date: 2023-03-14 Frequency: biweekly  Progress: 50 Modality: individual  Related Interventions Assign the client to self-monitor thoughts, feelings, and actions in daily journal (e.g., "Negative Thoughts Trigger Negative Feelings" in the Adult Psychotherapy Homework Planner by Jongsma; "Daily Record of Dysfunctional Thoughts" in Cognitive Therapy of Depression by Lupita Shutter, and Warren Lacy); process the journal material to challenge depressive thinking patterns and replace them with reality-based thoughts. Assign "behavioral experiments" in which depressive automatic thoughts are treated as hypotheses/prediction, reality-based alternative hypotheses/prediction are generated, and both are tested against the client's past, present, and/or future experiences. Facilitate and reinforce the client's shift from biased depressive self-talk and beliefs to reality-based cognitive messages that enhance self-confidence and increase adaptive actions (see "Positive Self-Talk" in the Adult Psychotherapy Homework Planner by Bryn Gulling). Objective Learn and implement behavioral strategies to overcome depression. Target Date: 2023-03-14 Frequency: biweekly  Progress: 30 Modality: individual  Related Interventions Engage the client in "behavioral activation," increasing his/her activity level and contact with sources of reward, while identifying processes that inhibit activation (see Behavioral Activation for Depression by Beverly Gust, Dimidjian, and Herman-Dunn; or assign "Identify and Schedule Pleasant Activities" in the Adult Psychotherapy Homework Planner by [facility]); use behavioral techniques such as instruction, rehearsal, role-playing, role reversal, as needed, to facilitate activity in the client's daily life; reinforce success. Assist the  client in developing skills that increase the likelihood of deriving pleasure from behavioral activation (e.g., assertiveness skills, developing an exercise plan, less internal/more external focus, increased social involvement); reinforce success. Objective Learn and implement problem-solving and decision-making  skills. Target Date: 2023-03-14 Frequency: biweekly  Progress: 30 Modality: individual  Related Interventions Encourage in the client the development of a positive problem orientation in which problems and solving them are viewed as a natural part of life and not something to be feared, despaired, or avoided. Conduct Problem-Solving Therapy (see Problem-Solving Therapy by Shawnee Knapp and Delane Ginger) using techniques such as psychoeducation, modeling, and role-playing to teach client problem-solving skills (i.e., defining a problem specifically, generating possible solutions, evaluating the pros and cons of each solution, selecting and implementing a plan of action, evaluating the efficacy of the plan, accepting or revising the plan); role-play application of the problem-solving skill to a real life issue (or assign "Applying Problem-Solving to Interpersonal Conflict" in the Adult Psychotherapy Homework Planner by Bryn Gulling). Objective Verbalize an understanding of healthy and unhealthy emotions with the intent of increasing the use of healthy emotions to guide actions. Target Date: 2023-03-14 Frequency: biweekly  Progress: 25 Modality: individual  Related Interventions Use a process-experiential approach consistent with Emotion-Focused Therapy to create a safe, nurturing environment in which the client can process emotions, learning to identify and regulate unhealthy feelings and to generate more adaptive ones that then guide actions (see Emotion-Focused Therapy for Depression by Andrey Spearman). Objective Verbalize insight into how past relationships may be influencing current experiences with  depression. Target Date: 2023-03-14 Frequency: biweekly  Progress: 20 Modality: individual  Related Interventions Explore experiences from the client's childhood that contribute to current depressed state. Explain a connection between previously unexpressed (repressed) feelings of anger (and helplessness) and current state of depression. Objective Increasingly verbalize hopeful and positive statements regarding self, others, and the future. Target Date: 2023-03-14 Frequency: biweekly  Progress: 30 Modality: individual  Related Interventions Teach the client more about depression and how to recognize and accept some sadness as a normal variation in feeling. Assign the client to write at least one positive affirmation statement daily regarding himself/herself and the future (or assign "Positive Self-Talk" in the Adult Psychotherapy Homework Planner by Bryn Gulling). Objective Verbalize an understanding and resolution of current interpersonal problems. Target Date: 2023-03-14 Frequency: biweekly  Progress: 30 Modality: individual  Related Interventions For role transitions (e.g., beginning or ending a relationship or career, moving, promotion, retirement, graduation), help the client mourn the loss of the old role while recognizing positive and negative aspects of the new role, and taking steps to gain mastery over the new role. For interpersonal deficits, help the client develop new interpersonal skills and relationships. 3. Appropriately grieve the loss in order to normalize mood and to return to previously adaptive level of functioning. 4. Become as knowledgeable as possible about the diagnosed condition and about living as normally as possible. 5. Develop a consistent, positive self-image. 6. Develop healthy interpersonal relationships that lead to the alleviation and help prevent the relapse of depression. 7. Develop healthy thinking patterns and beliefs about self, others, and the world that  lead to the alleviation and help prevent the relapse of depression. 8. Elevate self-esteem. Objective Increase insight into the historical and current sources of low self-esteem. Target Date: 2023-03-14 Frequency: biweekly  Progress: 25 Modality: individual  Related Interventions Help the client become aware of his/her fear of rejection and its connection with past rejection or abandonment experiences; begin to contrast past experiences of pain with present experiences of acceptance and competence. Objective Decrease the frequency of negative self-descriptive statements and increase frequency of positive self-descriptive statements. Target Date: 2023-03-14 Frequency: biweekly  Progress: 25 Modality: individual  Related Interventions Assist  the client in becoming aware of how he/she expresses or acts out negative feelings about himself/herself. Help the client reframe his/her negative assessment of himself/herself. Assist the client in developing positive self-talk as a way of boosting his/her confidence and self-image (or assign "Positive Self-Talk" in the Adult Psychotherapy Homework Planner by Bryn Gulling). Objective Identify and replace negative self-talk messages used to reinforce low self-esteem. Target Date: 2023-03-14 Frequency: biweekly  Progress: 25 Modality: individual  Related Interventions Help the client identify his/her distorted, negative beliefs about self and the world and replace these messages with more realistic, affirmative messages (or assign "Journal and Replace Self-Defeating Thoughts" in the Adult Psychotherapy Homework Planner by [facility] or read What to Say When You Talk to Yourself by Helmstetter). Objective Form realistic, appropriate, and attainable goals for self in all areas of life. Target Date: 2023-03-14 Frequency: biweekly  Progress: 20 Modality: individual  Related Interventions Help the client analyze his/her goals to make sure they are realistic and  attainable. Assign the client to make a list of goals for various areas of life and a plan for steps toward goal attainment. Objective Positively acknowledge verbal compliments from others. Target Date: 2023-03-14 Frequency: biweekly  Progress: 30 Modality: individual  Related Interventions Assign the client to be aware of and acknowledge graciously (without discounting) praise and compliments from others. Objective Demonstrate an increased ability to identify and express personal feelings. Target Date: 2023-03-14 Frequency: biweekly  Progress: 35 Modality: individual  Related Interventions Assist the client in identifying and labeling emotions. Objective Identify positive traits and talents about self. Target Date: 2023-03-14 Frequency: biweekly  Progress: 50 Modality: individual  Related Interventions Assign the client the exercise of identifying his/her positive physical characteristics in a mirror to help him/her become more comfortable with himself/herself. Objective Articulate a plan to be proactive in trying to get identified needs met. Target Date: 2023-03-14 Frequency: biweekly  Progress: 25 Modality: individual  Related Interventions Assist the client in identifying and verbalizing his/her needs, met and unmet. Assist the client in developing a specific action plan to get each need met (or assign "Satisfying Unmet Emotional Needs" in the Adult Psychotherapy Homework Planner by [facility]). 9. Enhance ability to effectively cope with the full variety of life's worries and anxieties. 10. Establish an inward sense of self-worth, confidence, and competence. 11. Interact socially without undue distress or disability. 12. Learn and implement coping skills that result in a reduction of anxiety and worry, and improved daily functioning. 13. Medically stabilize physical condition. Objective Identify the losses or limitations that have been experienced due to the medical  condition. Target Date: 2023-03-14 Frequency: biweekly  Progress: 20 Modality: individual  Related Interventions Ask the client to list the changes, losses, or limitations that have resulted from the medical condition (or assign "The Impact of My Illness" in the Adult Psychotherapy Homework Planner by Bryn Gulling). Objective Engage in social, productive, and recreational activities that are possible in spite of medical condition. Target Date: 2023-03-14 Frequency: biweekly  Progress: 20 Modality: individual  Related Interventions Sort out with the client activities that he/she can still enjoy either alone or with others (or assign "Identify and Schedule Pleasant Activities" in the Adult Psychotherapy Homework Planner by Bryn Gulling). Solicit a commitment from the client to increase his/her activity level by engaging in enjoyable and challenging activities; reinforce such engagement. Objective Learn and implement skills for preventing lapses back into more stressful reactions. Target Date: 2023-03-14 Frequency: biweekly  Progress: 30 Modality: individual  Related Interventions Teach the client relapse prevention  skills including distinguishing between a lapse and relapse, identifying and rehearsing the management of high-risk situations using skills learned in therapy, building a less stressful lifestyle, and periodically attending "booster" sessions of therapy. Objective Demonstrate mastery of coping skills by applying them to daily life situations. Target Date: 2023-03-14 Frequency: biweekly  Progress: 30 Modality: individual  Related Interventions Encourage skill development by having the client rehearse and practice coping skills in session through imaginal and/or behavioral rehearsal. Facilitate generalization of skills into everyday life by assigning homework (e.g., "Plan Before Acting" or "Journal and Replace Self-Defeating Thoughts" in the Adult Psychotherapy Homework Planner by Bryn Gulling) in  which the patient applies coping skills in graduating more demanding stressful situations; review, reinforcing success and problem-solving obstacles toward effective use of skills. Help the client internalize his/her new skill set and build self-efficacy by ensuring that the client "takes credit" for improvement and makes self-attributions for change. Objective Learn and implement skills for managing stress. Target Date: 2023-03-14 Frequency: biweekly  Progress: 40 Modality: individual  Related Interventions Conduct skills training, building upon effective coping strategies the client possesses, and teaching new skills tailored to the specific stressor. Train problem-focused personal and interpersonal coping skills (e.g., problem-solving, communication, conflict resolution, accessing social supports). Train emotionally focused coping skills (e.g., calming skills, perspective taking, emotional regulation, cognitive reframing). Objective Work with therapist to develop a plan for coping with stress. Target Date: 2023-03-14 Frequency: biweekly  Progress: 50 Modality: individual  Related Interventions Assist the client in developing a tailored coping action plan for preventing and/or managing identified stressful reactions using skills such as relaxation, exercise, cognitive reframing, and problem-solving. Objective Verbalize an increased understanding of the steps to grieving the losses brought on by the medical condition. Target Date: 2023-03-14 Frequency: biweekly  Progress: 40 Modality: individual  Related Interventions Educate the client on the stages of the grieving process and answer any questions that he/she may have. 14. Recognize, accept, and cope with feelings of depression. 15. Reduce fear, anxiety, and worry associated with the medical condition. 16. Reduce overall frequency, intensity, and duration of the anxiety so that daily functioning is not impaired. 17. Resolve the core  conflict that is the source of anxiety. 18. Stabilize anxiety level while increasing ability to function on a daily basis. Objective Describe situations, thoughts, feelings, and actions associated with anxieties and worries, their impact on functioning, and attempts to resolve them. Target Date: 2023-03-14 Frequency: biweekly  Progress: 30 Modality: individual  Related Interventions Ask the client to describe his/her past experiences of anxiety and their impact on functioning; assess the focus, excessiveness, and uncontrollability of the worry and the type, frequency, intensity, and duration of his/her anxiety symptoms (consider using a structured interview such as The Anxiety Disorders Interview Schedule-Adult Version). Objective Verbalize an understanding of the cognitive, physiological, and behavioral components of anxiety and its treatment. Target Date: 2023-03-14 Frequency: biweekly  Progress: 30 Modality: individual  Related Interventions Discuss how generalized anxiety typically involves excessive worry about unrealistic threats, various bodily expressions of tension, overarousal, and hypervigilance, and avoidance of what is threatening that interact to maintain the problem (see Mastery of Your Anxiety and Worry: Therapist Guide by Corky Mull, and Barlow; Treating Generalized Anxiety Disorder by Rygh and Amparo Bristol). Discuss how treatment targets worry, anxiety symptoms, and avoidance to help the client manage worry effectively, reduce overarousal, and eliminate unnecessary avoidance. Objective Learn and implement calming skills to reduce overall anxiety and manage anxiety symptoms. Target Date: 2023-03-14 Frequency: biweekly  Progress: 35 Modality: individual  Related Interventions Teach the client calming/relaxation skills (e.g., applied relaxation, progressive muscle relaxation, cue controlled relaxation; mindful breathing; biofeedback) and how to discriminate better between  relaxation and tension; teach the client how to apply these skills to his/her daily life (e.g., New Directions in Progressive Muscle Relaxation by Casper Harrison, and Hazlett-Stevens; Treating Generalized Anxiety Disorder by Rygh and Amparo Bristol). Assign the client homework each session in which he/she practices relaxation exercises daily, gradually applying them progressively from non-anxiety-provoking to anxiety-provoking situations; review and reinforce success while providing corrective feedback toward improvement. Objective Verbalize an understanding of the role that cognitive biases play in excessive irrational worry and persistent anxiety symptoms. Target Date: 2023-03-14 Frequency: biweekly  Progress: 60 Modality: individual  Related Interventions Discuss examples demonstrating that unrealistic worry typically overestimates the probability of threats and underestimates or overlooks the client's ability to manage realistic demands (or assign "Past Successful Anxiety Coping" in the Adult Psychotherapy Homework Planner by [facility]). Assist the client in analyzing his/her worries by examining potential biases such as the probability of the negative expectation occurring, the real consequences of it occurring, his/her ability to control the outcome, the worst possible outcome, and his/her ability to accept it (see "Analyze the Probability of a Feared Event" in the Adult Psychotherapy Homework Planner by Bryn Gulling; Cognitive Therapy of Anxiety Disorders by Alison Stalling). Objective Identify, challenge, and replace biased, fearful self-talk with positive, realistic, and empowering self-talk. Target Date: 2023-03-14 Frequency: biweekly  Progress: 30 Modality: individual  Related Interventions Explore the client's schema and self-talk that mediate his/her fear response; assist him/her in challenging the biases; replace the distorted messages with reality-based alternatives and positive, realistic  self-talk that will increase his/her self-confidence in coping with irrational fears (see Cognitive Therapy of Anxiety Disorders by Alison Stalling). Assign the client a homework exercise in which he/she identifies fearful self-talk, identifies biases in the self-talk, generates alternatives, and tests through behavioral experiments (or assign "Negative Thoughts Trigger Negative Feelings" in the Adult Psychotherapy Homework Planner by [facility]); review and reinforce success, providing corrective feedback toward improvement. Objective Identify and engage in pleasant activities on a daily basis. Target Date: 2023-03-14 Frequency: biweekly  Progress: 30 Modality: individual  Related Interventions Engage the client in behavioral activation, increasing the client's contact with sources of reward, identifying processes that inhibit activation, and teaching skills to solve life problems (or assign "Identify and Schedule Pleasant Activities" in the Adult Psychotherapy Homework Planner by Jongsma); use behavioral techniques such as instruction, rehearsal, role-playing, role reversal as needed to assist adoption into the client's daily life; reinforce success. Objective Learn and implement relapse prevention strategies for managing possible future anxiety symptoms. Target Date: 2023-03-14 Frequency: biweekly  Progress: 30 Modality: individual  Related Interventions Discuss with the client the distinction between a lapse and relapse, associating a lapse with an initial and reversible return of worry, anxiety symptoms, or urges to avoid, and relapse with the decision to continue the fearful and avoidant patterns. Identify and rehearse with the client the management of future situations or circumstances in which lapses could occur. Develop a "coping card" on which coping strategies and other important information (e.g., "Breathe deeply and relax," "Challenge unrealistic worries," "Use problem-solving") are written  for the client's later use. Instruct the client to routinely use new therapeutic skills (e.g., relaxation, cognitive restructuring, exposure, and problem-solving) in daily life to address emergent worries, anxiety, and avoidant tendencies. Objective Learn to accept limitations in life and commit to tolerating, rather than avoiding, unpleasant emotions while accomplishing meaningful  goals. Target Date: 2023-03-14 Frequency: biweekly  Progress: 40 Modality: individual  Related Interventions Use techniques from Acceptance and Commitment Therapy to help client accept uncomfortable realities such as lack of complete control, imperfections, and uncertainty and tolerate unpleasant emotions and thoughts in order to accomplish value-consistent goals.  Diagnosis:Major depressive disorder, recurrent episode, moderate (HCC)  Generalized anxiety disorder  Plan:  -begin considering options for socialization -meet again on Friday, May 27, 2022 at 10am in-person.

## 2022-05-27 ENCOUNTER — Ambulatory Visit: Payer: 59 | Admitting: Professional

## 2022-05-27 ENCOUNTER — Encounter: Payer: Self-pay | Admitting: Professional

## 2022-05-27 DIAGNOSIS — F331 Major depressive disorder, recurrent, moderate: Secondary | ICD-10-CM | POA: Diagnosis not present

## 2022-05-27 DIAGNOSIS — F411 Generalized anxiety disorder: Secondary | ICD-10-CM

## 2022-05-27 NOTE — Progress Notes (Signed)
[place] Counselor/Therapist Progress Note  Patient ID: [name] Brown, MRN: [id number],    Date: 05/27/2022  Time Spent:  51 minutes 1007-1058am  Treatment Type: Individual Therapy  Risk Assessment: Danger to Self:  No Self-injurious Behavior: No Danger to Others: No Duty to Warn:no  Subjective: The patient arrived late for his in person session.   Issues addressed:  1-homework- no; "I'm a loner" -begin considering options for socialization 2-relationships a-Susan -was talking but that ended -she is critical of him -they have been apart and they agreed to end the relationship B-how to explore relationship opportunities -Celebrate Recovery -grandmother crocheted a sign that hangs in his house about being responsible for your own joy   -pt reads this often -pt thinks when it "gets bad enough" that maybe then he will try and find social outlets -challenged pt to not wait until that time and invest in himself  anxiety and depression -still "flip flops" -realizes anxiety is self-inflicted 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 Accept the illness, and adapt life to the necessary limitations. 2. Alleviate depressive symptoms and return to previous level of effective functioning. Objective Verbalize an accurate understanding of depression. Target Date: 2023-03-14 Frequency: biweekly  Progress: 50 Modality: individual  Related Interventions Consistent with the treatment model, discuss how cognitive, behavioral, interpersonal, and/or other factors (e.g., family history) contribute to depression. Objective Identify and replace thoughts and beliefs that support depression. Target Date:  2023-03-14 Frequency: biweekly  Progress: 50 Modality: individual  Related Interventions Assign the client to self-monitor thoughts, feelings, and actions in daily journal (e.g., "Negative Thoughts Trigger Negative Feelings" in the Adult Psychotherapy Homework Planner by Jongsma; "Daily Record of Dysfunctional Thoughts" in Cognitive Therapy of Depression by Lupita Shutter, and Warren Lacy); process the journal material to challenge depressive thinking patterns and replace them with reality-based thoughts. Assign "behavioral experiments" in which depressive automatic thoughts are treated as hypotheses/prediction, reality-based alternative hypotheses/prediction are generated, and both are tested against the client's past, present, and/or future experiences. Facilitate and reinforce the client's shift from biased depressive self-talk and beliefs to reality-based cognitive messages that enhance self-confidence and increase adaptive actions (see "Positive Self-Talk" in the Adult Psychotherapy Homework Planner by Bryn Gulling). Objective Learn and implement behavioral strategies to overcome depression. Target Date: 2023-03-14 Frequency: biweekly  Progress: 30 Modality: individual  Related Interventions Engage the client in "behavioral activation," increasing his/her activity level and contact with sources of reward, while identifying processes that inhibit activation (see Behavioral Activation for Depression by Beverly Gust, Dimidjian, and Herman-Dunn; or assign "Identify and Schedule Pleasant Activities" in the Adult Psychotherapy Homework Planner by [facility]); use behavioral techniques such as instruction, rehearsal, role-playing, role reversal, as needed, to facilitate activity in the client's daily life; reinforce success. Assist the client in developing skills that increase the likelihood of deriving pleasure from behavioral activation (e.g., assertiveness skills, developing an exercise plan, less internal/more external  focus, increased social involvement); reinforce success. Objective Learn and implement problem-solving and decision-making skills. Target Date: 2023-03-14 Frequency: biweekly  Progress: 30 Modality: individual  Related Interventions Encourage in the client the development of a positive problem orientation in which problems and solving them are viewed as a natural part of life and not something  to be feared, despaired, or avoided. Conduct Problem-Solving Therapy (see Problem-Solving Therapy by Shawnee Knapp and Delane Ginger) using techniques such as psychoeducation, modeling, and role-playing to teach client problem-solving skills (i.e., defining a problem specifically, generating possible solutions, evaluating the pros and cons of each solution, selecting and implementing a plan of action, evaluating the efficacy of the plan, accepting or revising the plan); role-play application of the problem-solving skill to a real life issue (or assign "Applying Problem-Solving to Interpersonal Conflict" in the Adult Psychotherapy Homework Planner by Bryn Gulling). Objective Verbalize an understanding of healthy and unhealthy emotions with the intent of increasing the use of healthy emotions to guide actions. Target Date: 2023-03-14 Frequency: biweekly  Progress: 25 Modality: individual  Related Interventions Use a process-experiential approach consistent with Emotion-Focused Therapy to create a safe, nurturing environment in which the client can process emotions, learning to identify and regulate unhealthy feelings and to generate more adaptive ones that then guide actions (see Emotion-Focused Therapy for Depression by Andrey Spearman). Objective Verbalize insight into how past relationships may be influencing current experiences with depression. Target Date: 2023-03-14 Frequency: biweekly  Progress: 20 Modality: individual  Related Interventions Explore experiences from the client's childhood that contribute to current  depressed state. Explain a connection between previously unexpressed (repressed) feelings of anger (and helplessness) and current state of depression. Objective Increasingly verbalize hopeful and positive statements regarding self, others, and the future. Target Date: 2023-03-14 Frequency: biweekly  Progress: 30 Modality: individual  Related Interventions Teach the client more about depression and how to recognize and accept some sadness as a normal variation in feeling. Assign the client to write at least one positive affirmation statement daily regarding himself/herself and the future (or assign "Positive Self-Talk" in the Adult Psychotherapy Homework Planner by Bryn Gulling). Objective Verbalize an understanding and resolution of current interpersonal problems. Target Date: 2023-03-14 Frequency: biweekly  Progress: 30 Modality: individual  Related Interventions For role transitions (e.g., beginning or ending a relationship or career, moving, promotion, retirement, graduation), help the client mourn the loss of the old role while recognizing positive and negative aspects of the new role, and taking steps to gain mastery over the new role. For interpersonal deficits, help the client develop new interpersonal skills and relationships. 3. Appropriately grieve the loss in order to normalize mood and to return to previously adaptive level of functioning. 4. Become as knowledgeable as possible about the diagnosed condition and about living as normally as possible. 5. Develop a consistent, positive self-image. 6. Develop healthy interpersonal relationships that lead to the alleviation and help prevent the relapse of depression. 7. Develop healthy thinking patterns and beliefs about self, others, and the world that lead to the alleviation and help prevent the relapse of depression. 8. Elevate self-esteem. Objective Increase insight into the historical and current sources of low self-esteem. Target Date:  2023-03-14 Frequency: biweekly  Progress: 25 Modality: individual  Related Interventions Help the client become aware of his/her fear of rejection and its connection with past rejection or abandonment experiences; begin to contrast past experiences of pain with present experiences of acceptance and competence. Objective Decrease the frequency of negative self-descriptive statements and increase frequency of positive self-descriptive statements. Target Date: 2023-03-14 Frequency: biweekly  Progress: 25 Modality: individual  Related Interventions Assist the client in becoming aware of how he/she expresses or acts out negative feelings about himself/herself. Help the client reframe his/her negative assessment of himself/herself. Assist the client in developing positive self-talk as a way of boosting his/her confidence and self-image (or  assign "Positive Self-Talk" in the Adult Psychotherapy Homework Planner by Bryn Gulling). Objective Identify and replace negative self-talk messages used to reinforce low self-esteem. Target Date: 2023-03-14 Frequency: biweekly  Progress: 25 Modality: individual  Related Interventions Help the client identify his/her distorted, negative beliefs about self and the world and replace these messages with more realistic, affirmative messages (or assign "Journal and Replace Self-Defeating Thoughts" in the Adult Psychotherapy Homework Planner by [facility] or read What to Say When You Talk to Yourself by Helmstetter). Objective Form realistic, appropriate, and attainable goals for self in all areas of life. Target Date: 2023-03-14 Frequency: biweekly  Progress: 20 Modality: individual  Related Interventions Help the client analyze his/her goals to make sure they are realistic and attainable. Assign the client to make a list of goals for various areas of life and a plan for steps toward goal attainment. Objective Positively acknowledge verbal compliments from others. Target  Date: 2023-03-14 Frequency: biweekly  Progress: 30 Modality: individual  Related Interventions Assign the client to be aware of and acknowledge graciously (without discounting) praise and compliments from others. Objective Demonstrate an increased ability to identify and express personal feelings. Target Date: 2023-03-14 Frequency: biweekly  Progress: 35 Modality: individual  Related Interventions Assist the client in identifying and labeling emotions. Objective Identify positive traits and talents about self. Target Date: 2023-03-14 Frequency: biweekly  Progress: 50 Modality: individual  Related Interventions Assign the client the exercise of identifying his/her positive physical characteristics in a mirror to help him/her become more comfortable with himself/herself. Objective Articulate a plan to be proactive in trying to get identified needs met. Target Date: 2023-03-14 Frequency: biweekly  Progress: 25 Modality: individual  Related Interventions Assist the client in identifying and verbalizing his/her needs, met and unmet. Assist the client in developing a specific action plan to get each need met (or assign "Satisfying Unmet Emotional Needs" in the Adult Psychotherapy Homework Planner by [facility]). 9. Enhance ability to effectively cope with the full variety of life's worries and anxieties. 10. Establish an inward sense of self-worth, confidence, and competence. 11. Interact socially without undue distress or disability. 12. Learn and implement coping skills that result in a reduction of anxiety and worry, and improved daily functioning. 13. Medically stabilize physical condition. Objective Identify the losses or limitations that have been experienced due to the medical condition. Target Date: 2023-03-14 Frequency: biweekly  Progress: 20 Modality: individual  Related Interventions Ask the client to list the changes, losses, or limitations that have resulted from the medical  condition (or assign "The Impact of My Illness" in the Adult Psychotherapy Homework Planner by Bryn Gulling). Objective Engage in social, productive, and recreational activities that are possible in spite of medical condition. Target Date: 2023-03-14 Frequency: biweekly  Progress: 20 Modality: individual  Related Interventions Sort out with the client activities that he/she can still enjoy either alone or with others (or assign "Identify and Schedule Pleasant Activities" in the Adult Psychotherapy Homework Planner by Bryn Gulling). Solicit a commitment from the client to increase his/her activity level by engaging in enjoyable and challenging activities; reinforce such engagement. Objective Learn and implement skills for preventing lapses back into more stressful reactions. Target Date: 2023-03-14 Frequency: biweekly  Progress: 30 Modality: individual  Related Interventions Teach the client relapse prevention skills including distinguishing between a lapse and relapse, identifying and rehearsing the management of high-risk situations using skills learned in therapy, building a less stressful lifestyle, and periodically attending "booster" sessions of therapy. Objective Demonstrate mastery of coping skills by applying them  to daily life situations. Target Date: 2023-03-14 Frequency: biweekly  Progress: 30 Modality: individual  Related Interventions Encourage skill development by having the client rehearse and practice coping skills in session through imaginal and/or behavioral rehearsal. Facilitate generalization of skills into everyday life by assigning homework (e.g., "Plan Before Acting" or "Journal and Replace Self-Defeating Thoughts" in the Adult Psychotherapy Homework Planner by Bryn Gulling) in which the patient applies coping skills in graduating more demanding stressful situations; review, reinforcing success and problem-solving obstacles toward effective use of skills. Help the client internalize  his/her new skill set and build self-efficacy by ensuring that the client "takes credit" for improvement and makes self-attributions for change. Objective Learn and implement skills for managing stress. Target Date: 2023-03-14 Frequency: biweekly  Progress: 40 Modality: individual  Related Interventions Conduct skills training, building upon effective coping strategies the client possesses, and teaching new skills tailored to the specific stressor. Train problem-focused personal and interpersonal coping skills (e.g., problem-solving, communication, conflict resolution, accessing social supports). Train emotionally focused coping skills (e.g., calming skills, perspective taking, emotional regulation, cognitive reframing). Objective Work with therapist to develop a plan for coping with stress. Target Date: 2023-03-14 Frequency: biweekly  Progress: 50 Modality: individual  Related Interventions Assist the client in developing a tailored coping action plan for preventing and/or managing identified stressful reactions using skills such as relaxation, exercise, cognitive reframing, and problem-solving. Objective Verbalize an increased understanding of the steps to grieving the losses brought on by the medical condition. Target Date: 2023-03-14 Frequency: biweekly  Progress: 40 Modality: individual  Related Interventions Educate the client on the stages of the grieving process and answer any questions that he/she may have. 14. Recognize, accept, and cope with feelings of depression. 15. Reduce fear, anxiety, and worry associated with the medical condition. 16. Reduce overall frequency, intensity, and duration of the anxiety so that daily functioning is not impaired. 17. Resolve the core conflict that is the source of anxiety. 18. Stabilize anxiety level while increasing ability to function on a daily basis. Objective Describe situations, thoughts, feelings, and actions associated with anxieties  and worries, their impact on functioning, and attempts to resolve them. Target Date: 2023-03-14 Frequency: biweekly  Progress: 30 Modality: individual  Related Interventions Ask the client to describe his/her past experiences of anxiety and their impact on functioning; assess the focus, excessiveness, and uncontrollability of the worry and the type, frequency, intensity, and duration of his/her anxiety symptoms (consider using a structured interview such as The Anxiety Disorders Interview Schedule-Adult Version). Objective Verbalize an understanding of the cognitive, physiological, and behavioral components of anxiety and its treatment. Target Date: 2023-03-14 Frequency: biweekly  Progress: 30 Modality: individual  Related Interventions Discuss how generalized anxiety typically involves excessive worry about unrealistic threats, various bodily expressions of tension, overarousal, and hypervigilance, and avoidance of what is threatening that interact to maintain the problem (see Mastery of Your Anxiety and Worry: Therapist Guide by Corky Mull, and Barlow; Treating Generalized Anxiety Disorder by Rygh and Amparo Bristol). Discuss how treatment targets worry, anxiety symptoms, and avoidance to help the client manage worry effectively, reduce overarousal, and eliminate unnecessary avoidance. Objective Learn and implement calming skills to reduce overall anxiety and manage anxiety symptoms. Target Date: 2023-03-14 Frequency: biweekly  Progress: 35 Modality: individual  Related Interventions Teach the client calming/relaxation skills (e.g., applied relaxation, progressive muscle relaxation, cue controlled relaxation; mindful breathing; biofeedback) and how to discriminate better between relaxation and tension; teach the client how to apply these skills to his/her daily life (e.g., New  Directions in Progressive Muscle Relaxation by Casper Harrison, and Hazlett-Stevens; Treating Generalized Anxiety  Disorder by Rygh and Amparo Bristol). Assign the client homework each session in which he/she practices relaxation exercises daily, gradually applying them progressively from non-anxiety-provoking to anxiety-provoking situations; review and reinforce success while providing corrective feedback toward improvement. Objective Verbalize an understanding of the role that cognitive biases play in excessive irrational worry and persistent anxiety symptoms. Target Date: 2023-03-14 Frequency: biweekly  Progress: 60 Modality: individual  Related Interventions Discuss examples demonstrating that unrealistic worry typically overestimates the probability of threats and underestimates or overlooks the client's ability to manage realistic demands (or assign "Past Successful Anxiety Coping" in the Adult Psychotherapy Homework Planner by [facility]). Assist the client in analyzing his/her worries by examining potential biases such as the probability of the negative expectation occurring, the real consequences of it occurring, his/her ability to control the outcome, the worst possible outcome, and his/her ability to accept it (see "Analyze the Probability of a Feared Event" in the Adult Psychotherapy Homework Planner by Bryn Gulling; Cognitive Therapy of Anxiety Disorders by Alison Stalling). Objective Identify, challenge, and replace biased, fearful self-talk with positive, realistic, and empowering self-talk. Target Date: 2023-03-14 Frequency: biweekly  Progress: 30 Modality: individual  Related Interventions Explore the client's schema and self-talk that mediate his/her fear response; assist him/her in challenging the biases; replace the distorted messages with reality-based alternatives and positive, realistic self-talk that will increase his/her self-confidence in coping with irrational fears (see Cognitive Therapy of Anxiety Disorders by Alison Stalling). Assign the client a homework exercise in which he/she identifies fearful  self-talk, identifies biases in the self-talk, generates alternatives, and tests through behavioral experiments (or assign "Negative Thoughts Trigger Negative Feelings" in the Adult Psychotherapy Homework Planner by [facility]); review and reinforce success, providing corrective feedback toward improvement. Objective Identify and engage in pleasant activities on a daily basis. Target Date: 2023-03-14 Frequency: biweekly  Progress: 30 Modality: individual  Related Interventions Engage the client in behavioral activation, increasing the client's contact with sources of reward, identifying processes that inhibit activation, and teaching skills to solve life problems (or assign "Identify and Schedule Pleasant Activities" in the Adult Psychotherapy Homework Planner by Jongsma); use behavioral techniques such as instruction, rehearsal, role-playing, role reversal as needed to assist adoption into the client's daily life; reinforce success. Objective Learn and implement relapse prevention strategies for managing possible future anxiety symptoms. Target Date: 2023-03-14 Frequency: biweekly  Progress: 30 Modality: individual  Related Interventions Discuss with the client the distinction between a lapse and relapse, associating a lapse with an initial and reversible return of worry, anxiety symptoms, or urges to avoid, and relapse with the decision to continue the fearful and avoidant patterns. Identify and rehearse with the client the management of future situations or circumstances in which lapses could occur. Develop a "coping card" on which coping strategies and other important information (e.g., "Breathe deeply and relax," "Challenge unrealistic worries," "Use problem-solving") are written for the client's later use. Instruct the client to routinely use new therapeutic skills (e.g., relaxation, cognitive restructuring, exposure, and problem-solving) in daily life to address emergent worries, anxiety, and  avoidant tendencies. Objective Learn to accept limitations in life and commit to tolerating, rather than avoiding, unpleasant emotions while accomplishing meaningful goals. Target Date: 2023-03-14 Frequency: biweekly  Progress: 40 Modality: individual  Related Interventions Use techniques from Acceptance and Commitment Therapy to help client accept uncomfortable realities such as lack of complete control, imperfections, and uncertainty and tolerate unpleasant emotions and thoughts  in order to accomplish value-consistent goals.  Diagnosis:Major depressive disorder, recurrent episode, moderate (HCC)  Generalized anxiety disorder  Plan:  -consider [facility] Recovery -meet again on Thursday, June 09, 2022 at [facility] in-person.

## 2022-06-09 ENCOUNTER — Ambulatory Visit: Payer: 59 | Admitting: Professional

## 2022-06-09 ENCOUNTER — Encounter: Payer: Self-pay | Admitting: Professional

## 2022-06-09 DIAGNOSIS — F411 Generalized anxiety disorder: Secondary | ICD-10-CM | POA: Diagnosis not present

## 2022-06-09 DIAGNOSIS — F331 Major depressive disorder, recurrent, moderate: Secondary | ICD-10-CM

## 2022-06-09 NOTE — Progress Notes (Signed)
[place] Counselor/Therapist Progress Note  Patient ID: RAUN ROUTH, MRN: [id number],    Date: 06/09/2022  Time Spent:  42 minutes [id number]  Treatment Type: Individual Therapy  Risk Assessment: Danger to Self:  No Self-injurious Behavior: No Danger to Others: No Duty to Warn:no  Subjective: This session was held via audio teletherapy The patient consented to a phone call and was located in his truck during this session. He is aware it is the responsibility of the patient to secure confidentiality on his end of the session. The provider was in a private home office for the duration of this session.    The patient arrived late for his telephone session.   Issues addressed:  1-homework- he did but has not made any moves -consider [facility] 2-self-esteem a-struggles to identify himself as a  b-discussed if he is a good person c-what would cause you to feel fulfilled -waking up and looking forward to what I'm doing and who I'm around -how to explore ways he can contribute 3-social -accountable to himself for creating the life he wants -how to access social opportunities -avoiding all or none thinking -creating healthy balance between alone time and social time  Treatment Plan Problems Addressed  Anxiety, Depression, Low Self-Esteem, Medical Issues  Goals 1. Accept the illness, and adapt life to the necessary limitations. 2. Alleviate depressive symptoms and return to previous level of effective functioning. Objective Verbalize an accurate understanding of depression. Target Date: 2023-03-14 Frequency: biweekly  Progress: 50 Modality: individual  Related Interventions Consistent with the treatment model, discuss how cognitive, behavioral, interpersonal, and/or other factors (e.g., family history) contribute to depression. Objective Identify and replace thoughts and beliefs that support depression. Target Date: 2023-03-14 Frequency: biweekly   Progress: 50 Modality: individual  Related Interventions Assign the client to self-monitor thoughts, feelings, and actions in daily journal (e.g., "Negative Thoughts Trigger Negative Feelings" in the Adult Psychotherapy Homework Planner by Jongsma; "Daily Record of Dysfunctional Thoughts" in Cognitive Therapy of Depression by Lupita Shutter, and Warren Lacy); process the journal material to challenge depressive thinking patterns and replace them with reality-based thoughts. Assign "behavioral experiments" in which depressive automatic thoughts are treated as hypotheses/prediction, reality-based alternative hypotheses/prediction are generated, and both are tested against the client's past, present, and/or future experiences. Facilitate and reinforce the client's shift from biased depressive self-talk and beliefs to reality-based cognitive messages that enhance self-confidence and increase adaptive actions (see "Positive Self-Talk" in the Adult Psychotherapy Homework Planner by Bryn Gulling). Objective Learn and implement behavioral strategies to overcome depression. Target Date: 2023-03-14 Frequency: biweekly  Progress: 30 Modality: individual  Related Interventions Engage the client in "behavioral activation," increasing his/her activity level and contact with sources of reward, while identifying processes that inhibit activation (see Behavioral Activation for Depression by Beverly Gust, Dimidjian, and Herman-Dunn; or assign "Identify and Schedule Pleasant Activities" in the Adult Psychotherapy Homework Planner by [facility] use behavioral techniques such as instruction, rehearsal, role-playing, role reversal, as needed, to facilitate activity in the client's daily life; reinforce success. Assist the client in developing skills that increase the likelihood of deriving pleasure from behavioral activation (e.g., assertiveness skills, developing an exercise plan, less internal/more external focus, increased social  involvement); reinforce success. Objective Learn and implement problem-solving and decision-making skills. Target Date: 2023-03-14 Frequency: biweekly  Progress: 30 Modality: individual  Related Interventions Encourage in the client the development of a positive problem orientation in which problems and solving them are viewed as a natural part of life and not  something to be feared, despaired, or avoided. Conduct Problem-Solving Therapy (see Problem-Solving Therapy by Shawnee Knapp and Delane Ginger) using techniques such as psychoeducation, modeling, and role-playing to teach client problem-solving skills (i.e., defining a problem specifically, generating possible solutions, evaluating the pros and cons of each solution, selecting and implementing a plan of action, evaluating the efficacy of the plan, accepting or revising the plan); role-play application of the problem-solving skill to a real life issue (or assign "Applying Problem-Solving to Interpersonal Conflict" in the Adult Psychotherapy Homework Planner by Bryn Gulling). Objective Verbalize an understanding of healthy and unhealthy emotions with the intent of increasing the use of healthy emotions to guide actions. Target Date: 2023-03-14 Frequency: biweekly  Progress: 25 Modality: individual  Related Interventions Use a process-experiential approach consistent with Emotion-Focused Therapy to create a safe, nurturing environment in which the client can process emotions, learning to identify and regulate unhealthy feelings and to generate more adaptive ones that then guide actions (see Emotion-Focused Therapy for Depression by Andrey Spearman). Objective Verbalize insight into how past relationships may be influencing current experiences with depression. Target Date: 2023-03-14 Frequency: biweekly  Progress: 20 Modality: individual  Related Interventions Explore experiences from the client's childhood that contribute to current depressed  state. Explain a connection between previously unexpressed (repressed) feelings of anger (and helplessness) and current state of depression. Objective Increasingly verbalize hopeful and positive statements regarding self, others, and the future. Target Date: 2023-03-14 Frequency: biweekly  Progress: 30 Modality: individual  Related Interventions Teach the client more about depression and how to recognize and accept some sadness as a normal variation in feeling. Assign the client to write at least one positive affirmation statement daily regarding himself/herself and the future (or assign "Positive Self-Talk" in the Adult Psychotherapy Homework Planner by Bryn Gulling). Objective Verbalize an understanding and resolution of current interpersonal problems. Target Date: 2023-03-14 Frequency: biweekly  Progress: 30 Modality: individual  Related Interventions For role transitions (e.g., beginning or ending a relationship or career, moving, promotion, retirement, graduation), help the client mourn the loss of the old role while recognizing positive and negative aspects of the new role, and taking steps to gain mastery over the new role. For interpersonal deficits, help the client develop new interpersonal skills and relationships. 3. Appropriately grieve the loss in order to normalize mood and to return to previously adaptive level of functioning. 4. Become as knowledgeable as possible about the diagnosed condition and about living as normally as possible. 5. Develop a consistent, positive self-image. 6. Develop healthy interpersonal relationships that lead to the alleviation and help prevent the relapse of depression. 7. Develop healthy thinking patterns and beliefs about self, others, and the world that lead to the alleviation and help prevent the relapse of depression. 8. Elevate self-esteem. Objective Increase insight into the historical and current sources of low self-esteem. Target Date:  2023-03-14 Frequency: biweekly  Progress: 25 Modality: individual  Related Interventions Help the client become aware of his/her fear of rejection and its connection with past rejection or abandonment experiences; begin to contrast past experiences of pain with present experiences of acceptance and competence. Objective Decrease the frequency of negative self-descriptive statements and increase frequency of positive self-descriptive statements. Target Date: 2023-03-14 Frequency: biweekly  Progress: 25 Modality: individual  Related Interventions Assist the client in becoming aware of how he/she expresses or acts out negative feelings about himself/herself. Help the client reframe his/her negative assessment of himself/herself. Assist the client in developing positive self-talk as a way of boosting his/her confidence and self-image (  or assign "Positive Self-Talk" in the Adult Psychotherapy Homework Planner by Bryn Gulling). Objective Identify and replace negative self-talk messages used to reinforce low self-esteem. Target Date: 2023-03-14 Frequency: biweekly  Progress: 25 Modality: individual  Related Interventions Help the client identify his/her distorted, negative beliefs about self and the world and replace these messages with more realistic, affirmative messages (or assign "Journal and Replace Self-Defeating Thoughts" in the Adult Psychotherapy Homework Planner by [facility] or read What to Say When You Talk to Yourself by Helmstetter). Objective Form realistic, appropriate, and attainable goals for self in all areas of life. Target Date: 2023-03-14 Frequency: biweekly  Progress: 20 Modality: individual  Related Interventions Help the client analyze his/her goals to make sure they are realistic and attainable. Assign the client to make a list of goals for various areas of life and a plan for steps toward goal attainment. Objective Positively acknowledge verbal compliments from others. Target  Date: 2023-03-14 Frequency: biweekly  Progress: 30 Modality: individual  Related Interventions Assign the client to be aware of and acknowledge graciously (without discounting) praise and compliments from others. Objective Demonstrate an increased ability to identify and express personal feelings. Target Date: 2023-03-14 Frequency: biweekly  Progress: 35 Modality: individual  Related Interventions Assist the client in identifying and labeling emotions. Objective Identify positive traits and talents about self. Target Date: 2023-03-14 Frequency: biweekly  Progress: 50 Modality: individual  Related Interventions Assign the client the exercise of identifying his/her positive physical characteristics in a mirror to help him/her become more comfortable with himself/herself. Objective Articulate a plan to be proactive in trying to get identified needs met. Target Date: 2023-03-14 Frequency: biweekly  Progress: 25 Modality: individual  Related Interventions Assist the client in identifying and verbalizing his/her needs, met and unmet. Assist the client in developing a specific action plan to get each need met (or assign "Satisfying Unmet Emotional Needs" in the Adult Psychotherapy Homework Planner by [facility]). 9. Enhance ability to effectively cope with the full variety of life's worries and anxieties. 10. Establish an inward sense of self-worth, confidence, and competence. 11. Interact socially without undue distress or disability. 12. Learn and implement coping skills that result in a reduction of anxiety and worry, and improved daily functioning. 13. Medically stabilize physical condition. Objective Identify the losses or limitations that have been experienced due to the medical condition. Target Date: 2023-03-14 Frequency: biweekly  Progress: 20 Modality: individual  Related Interventions Ask the client to list the changes, losses, or limitations that have resulted from the medical  condition (or assign "The Impact of My Illness" in the Adult Psychotherapy Homework Planner by Bryn Gulling). Objective Engage in social, productive, and recreational activities that are possible in spite of medical condition. Target Date: 2023-03-14 Frequency: biweekly  Progress: 20 Modality: individual  Related Interventions Sort out with the client activities that he/she can still enjoy either alone or with others (or assign "Identify and Schedule Pleasant Activities" in the Adult Psychotherapy Homework Planner by Bryn Gulling). Solicit a commitment from the client to increase his/her activity level by engaging in enjoyable and challenging activities; reinforce such engagement. Objective Learn and implement skills for preventing lapses back into more stressful reactions. Target Date: 2023-03-14 Frequency: biweekly  Progress: 30 Modality: individual  Related Interventions Teach the client relapse prevention skills including distinguishing between a lapse and relapse, identifying and rehearsing the management of high-risk situations using skills learned in therapy, building a less stressful lifestyle, and periodically attending "booster" sessions of therapy. Objective Demonstrate mastery of coping skills by applying  them to daily life situations. Target Date: 2023-03-14 Frequency: biweekly  Progress: 30 Modality: individual  Related Interventions Encourage skill development by having the client rehearse and practice coping skills in session through imaginal and/or behavioral rehearsal. Facilitate generalization of skills into everyday life by assigning homework (e.g., "Plan Before Acting" or "Journal and Replace Self-Defeating Thoughts" in the Adult Psychotherapy Homework Planner by Bryn Gulling) in which the patient applies coping skills in graduating more demanding stressful situations; review, reinforcing success and problem-solving obstacles toward effective use of skills. Help the client internalize  his/her new skill set and build self-efficacy by ensuring that the client "takes credit" for improvement and makes self-attributions for change. Objective Learn and implement skills for managing stress. Target Date: 2023-03-14 Frequency: biweekly  Progress: 40 Modality: individual  Related Interventions Conduct skills training, building upon effective coping strategies the client possesses, and teaching new skills tailored to the specific stressor. Train problem-focused personal and interpersonal coping skills (e.g., problem-solving, communication, conflict resolution, accessing social supports). Train emotionally focused coping skills (e.g., calming skills, perspective taking, emotional regulation, cognitive reframing). Objective Work with therapist to develop a plan for coping with stress. Target Date: 2023-03-14 Frequency: biweekly  Progress: 50 Modality: individual  Related Interventions Assist the client in developing a tailored coping action plan for preventing and/or managing identified stressful reactions using skills such as relaxation, exercise, cognitive reframing, and problem-solving. Objective Verbalize an increased understanding of the steps to grieving the losses brought on by the medical condition. Target Date: 2023-03-14 Frequency: biweekly  Progress: 40 Modality: individual  Related Interventions Educate the client on the stages of the grieving process and answer any questions that he/she may have. 14. Recognize, accept, and cope with feelings of depression. 15. Reduce fear, anxiety, and worry associated with the medical condition. 16. Reduce overall frequency, intensity, and duration of the anxiety so that daily functioning is not impaired. 17. Resolve the core conflict that is the source of anxiety. 18. Stabilize anxiety level while increasing ability to function on a daily basis. Objective Describe situations, thoughts, feelings, and actions associated with anxieties  and worries, their impact on functioning, and attempts to resolve them. Target Date: 2023-03-14 Frequency: biweekly  Progress: 30 Modality: individual  Related Interventions Ask the client to describe his/her past experiences of anxiety and their impact on functioning; assess the focus, excessiveness, and uncontrollability of the worry and the type, frequency, intensity, and duration of his/her anxiety symptoms (consider using a structured interview such as The Anxiety Disorders Interview Schedule-Adult Version). Objective Verbalize an understanding of the cognitive, physiological, and behavioral components of anxiety and its treatment. Target Date: 2023-03-14 Frequency: biweekly  Progress: 30 Modality: individual  Related Interventions Discuss how generalized anxiety typically involves excessive worry about unrealistic threats, various bodily expressions of tension, overarousal, and hypervigilance, and avoidance of what is threatening that interact to maintain the problem (see Mastery of Your Anxiety and Worry: Therapist Guide by Corky Mull, and Barlow; Treating Generalized Anxiety Disorder by Rygh and Amparo Bristol). Discuss how treatment targets worry, anxiety symptoms, and avoidance to help the client manage worry effectively, reduce overarousal, and eliminate unnecessary avoidance. Objective Learn and implement calming skills to reduce overall anxiety and manage anxiety symptoms. Target Date: 2023-03-14 Frequency: biweekly  Progress: 35 Modality: individual  Related Interventions Teach the client calming/relaxation skills (e.g., applied relaxation, progressive muscle relaxation, cue controlled relaxation; mindful breathing; biofeedback) and how to discriminate better between relaxation and tension; teach the client how to apply these skills to his/her daily life (e.g.,  New Directions in Progressive Muscle Relaxation by Casper Harrison, and Hazlett-Stevens; Treating Generalized Anxiety  Disorder by Rygh and Amparo Bristol). Assign the client homework each session in which he/she practices relaxation exercises daily, gradually applying them progressively from non-anxiety-provoking to anxiety-provoking situations; review and reinforce success while providing corrective feedback toward improvement. Objective Verbalize an understanding of the role that cognitive biases play in excessive irrational worry and persistent anxiety symptoms. Target Date: 2023-03-14 Frequency: biweekly  Progress: 60 Modality: individual  Related Interventions Discuss examples demonstrating that unrealistic worry typically overestimates the probability of threats and underestimates or overlooks the client's ability to manage realistic demands (or assign "Past Successful Anxiety Coping" in the Adult Psychotherapy Homework Planner by [facility]). Assist the client in analyzing his/her worries by examining potential biases such as the probability of the negative expectation occurring, the real consequences of it occurring, his/her ability to control the outcome, the worst possible outcome, and his/her ability to accept it (see "Analyze the Probability of a Feared Event" in the Adult Psychotherapy Homework Planner by Bryn Gulling; Cognitive Therapy of Anxiety Disorders by Alison Stalling). Objective Identify, challenge, and replace biased, fearful self-talk with positive, realistic, and empowering self-talk. Target Date: 2023-03-14 Frequency: biweekly  Progress: 30 Modality: individual  Related Interventions Explore the client's schema and self-talk that mediate his/her fear response; assist him/her in challenging the biases; replace the distorted messages with reality-based alternatives and positive, realistic self-talk that will increase his/her self-confidence in coping with irrational fears (see Cognitive Therapy of Anxiety Disorders by Alison Stalling). Assign the client a homework exercise in which he/she identifies fearful  self-talk, identifies biases in the self-talk, generates alternatives, and tests through behavioral experiments (or assign "Negative Thoughts Trigger Negative Feelings" in the Adult Psychotherapy Homework Planner by [facility]); review and reinforce success, providing corrective feedback toward improvement. Objective Identify and engage in pleasant activities on a daily basis. Target Date: 2023-03-14 Frequency: biweekly  Progress: 30 Modality: individual  Related Interventions Engage the client in behavioral activation, increasing the client's contact with sources of reward, identifying processes that inhibit activation, and teaching skills to solve life problems (or assign "Identify and Schedule Pleasant Activities" in the Adult Psychotherapy Homework Planner by Jongsma); use behavioral techniques such as instruction, rehearsal, role-playing, role reversal as needed to assist adoption into the client's daily life; reinforce success. Objective Learn and implement relapse prevention strategies for managing possible future anxiety symptoms. Target Date: 2023-03-14 Frequency: biweekly  Progress: 30 Modality: individual  Related Interventions Discuss with the client the distinction between a lapse and relapse, associating a lapse with an initial and reversible return of worry, anxiety symptoms, or urges to avoid, and relapse with the decision to continue the fearful and avoidant patterns. Identify and rehearse with the client the management of future situations or circumstances in which lapses could occur. Develop a "coping card" on which coping strategies and other important information (e.g., "Breathe deeply and relax," "Challenge unrealistic worries," "Use problem-solving") are written for the client's later use. Instruct the client to routinely use new therapeutic skills (e.g., relaxation, cognitive restructuring, exposure, and problem-solving) in daily life to address emergent worries, anxiety, and  avoidant tendencies. Objective Learn to accept limitations in life and commit to tolerating, rather than avoiding, unpleasant emotions while accomplishing meaningful goals. Target Date: 2023-03-14 Frequency: biweekly  Progress: 40 Modality: individual  Related Interventions Use techniques from Acceptance and Commitment Therapy to help client accept uncomfortable realities such as lack of complete control, imperfections, and uncertainty and tolerate unpleasant emotions and  thoughts in order to accomplish value-consistent goals.  Diagnosis:Major depressive disorder, recurrent episode, moderate (HCC)  Generalized anxiety disorder  Plan:  -engage at least one social activity during your time off -meet again on Friday, June 24, 2022 at 10am in-person.

## 2022-06-24 ENCOUNTER — Ambulatory Visit: Payer: 59 | Admitting: Professional

## 2022-06-27 ENCOUNTER — Ambulatory Visit
Admission: RE | Admit: 2022-06-27 | Discharge: 2022-06-27 | Disposition: A | Discharge status: Home / Self Care | Payer: 59 | Source: Ambulatory Visit | Attending: Gastroenterology | Admitting: Gastroenterology

## 2022-06-27 DIAGNOSIS — Z8719 Personal history of other diseases of the digestive system: Secondary | ICD-10-CM

## 2022-07-14 ENCOUNTER — Ambulatory Visit (INDEPENDENT_AMBULATORY_CARE_PROVIDER_SITE_OTHER): Payer: 59 | Admitting: Professional

## 2022-07-14 ENCOUNTER — Encounter: Payer: Self-pay | Admitting: Professional

## 2022-07-14 DIAGNOSIS — F411 Generalized anxiety disorder: Secondary | ICD-10-CM

## 2022-07-14 DIAGNOSIS — F331 Major depressive disorder, recurrent, moderate: Secondary | ICD-10-CM

## 2022-07-14 NOTE — Progress Notes (Signed)
[facility] Counselor/Therapist Progress Note  Patient ID: RAMIN ZOLL, MRN: [id number],    Date: 07/14/2022  Time Spent:  40 minutes 1001-1041am  Treatment Type: Individual Therapy  Risk Assessment: Danger to Self:  No Self-injurious Behavior: No Danger to Others: No Duty to Warn:no  Subjective: This session was held via audio teletherapy The patient consented to a phone call and was located in his truck during this session. He is aware it is the responsibility of the patient to secure confidentiality on his end of the session. The provider was in a private home office for the duration of this session.    The patient arrived late for his telephone session.   Issues addressed:  1-homework- none -engage at least one social activity during your time off 2-mother passed away unexpectedly -apparently had a stroke and died on 07-27-2022 -Christmas was spent in ICU with family -sat and watched her starve to death since she could not swallow -his dad is doing the best he can 2-carpal tunnel surgery next Thursday -has to have because he cannot sleep 3-coping with loss -educated -support to patient as he talked about his mom and now supporting his dad  Treatment Plan Problems Addressed  Anxiety, Depression, Low Self-Esteem, Medical Issues  Goals 1. Accept the illness, and adapt life to the necessary limitations. 2. Alleviate depressive symptoms and return to previous level of effective functioning. Objective Verbalize an accurate understanding of depression. Target Date: 2023-03-14 Frequency: biweekly  Progress: 50 Modality: individual  Related Interventions Consistent with the treatment model, discuss how cognitive, behavioral, interpersonal, and/or other factors (e.g., family history) contribute to depression. Objective Identify and replace thoughts and beliefs that support depression. Target Date: 2023-03-14 Frequency: biweekly  Progress: 50 Modality:  individual  Related Interventions Assign the client to self-monitor thoughts, feelings, and actions in daily journal (e.g., "Negative Thoughts Trigger Negative Feelings" in the Adult Psychotherapy Homework Planner by Jongsma; "Daily Record of Dysfunctional Thoughts" in Cognitive Therapy of Depression by Lupita Shutter, and Warren Lacy); process the journal material to challenge depressive thinking patterns and replace them with reality-based thoughts. Assign "behavioral experiments" in which depressive automatic thoughts are treated as hypotheses/prediction, reality-based alternative hypotheses/prediction are generated, and both are tested against the client's past, present, and/or future experiences. Facilitate and reinforce the client's shift from biased depressive self-talk and beliefs to reality-based cognitive messages that enhance self-confidence and increase adaptive actions (see "Positive Self-Talk" in the Adult Psychotherapy Homework Planner by Bryn Gulling). Objective Learn and implement behavioral strategies to overcome depression. Target Date: 2023-03-14 Frequency: biweekly  Progress: 30 Modality: individual  Related Interventions Engage the client in "behavioral activation," increasing his/her activity level and contact with sources of reward, while identifying processes that inhibit activation (see Behavioral Activation for Depression by Beverly Gust, Dimidjian, and Herman-Dunn; or assign "Identify and Schedule Pleasant Activities" in the Adult Psychotherapy Homework Planner by [facility]); use behavioral techniques such as instruction, rehearsal, role-playing, role reversal, as needed, to facilitate activity in the client's daily life; reinforce success. Assist the client in developing skills that increase the likelihood of deriving pleasure from behavioral activation (e.g., assertiveness skills, developing an exercise plan, less internal/more external focus, increased social involvement); reinforce  success. Objective Learn and implement problem-solving and decision-making skills. Target Date: 2023-03-14 Frequency: biweekly  Progress: 30 Modality: individual  Related Interventions Encourage in the client the development of a positive problem orientation in which problems and solving them are viewed as a natural part of life and not something  to be feared, despaired, or avoided. Conduct Problem-Solving Therapy (see Problem-Solving Therapy by Shawnee Knapp and Delane Ginger) using techniques such as psychoeducation, modeling, and role-playing to teach client problem-solving skills (i.e., defining a problem specifically, generating possible solutions, evaluating the pros and cons of each solution, selecting and implementing a plan of action, evaluating the efficacy of the plan, accepting or revising the plan); role-play application of the problem-solving skill to a real life issue (or assign "Applying Problem-Solving to Interpersonal Conflict" in the Adult Psychotherapy Homework Planner by Bryn Gulling). Objective Verbalize an understanding of healthy and unhealthy emotions with the intent of increasing the use of healthy emotions to guide actions. Target Date: 2023-03-14 Frequency: biweekly  Progress: 25 Modality: individual  Related Interventions Use a process-experiential approach consistent with Emotion-Focused Therapy to create a safe, nurturing environment in which the client can process emotions, learning to identify and regulate unhealthy feelings and to generate more adaptive ones that then guide actions (see Emotion-Focused Therapy for Depression by Andrey Spearman). Objective Verbalize insight into how past relationships may be influencing current experiences with depression. Target Date: 2023-03-14 Frequency: biweekly  Progress: 20 Modality: individual  Related Interventions Explore experiences from the client's childhood that contribute to current depressed state. Explain a connection between  previously unexpressed (repressed) feelings of anger (and helplessness) and current state of depression. Objective Increasingly verbalize hopeful and positive statements regarding self, others, and the future. Target Date: 2023-03-14 Frequency: biweekly  Progress: 30 Modality: individual  Related Interventions Teach the client more about depression and how to recognize and accept some sadness as a normal variation in feeling. Assign the client to write at least one positive affirmation statement daily regarding himself/herself and the future (or assign "Positive Self-Talk" in the Adult Psychotherapy Homework Planner by Bryn Gulling). Objective Verbalize an understanding and resolution of current interpersonal problems. Target Date: 2023-03-14 Frequency: biweekly  Progress: 30 Modality: individual  Related Interventions For role transitions (e.g., beginning or ending a relationship or career, moving, promotion, retirement, graduation), help the client mourn the loss of the old role while recognizing positive and negative aspects of the new role, and taking steps to gain mastery over the new role. For interpersonal deficits, help the client develop new interpersonal skills and relationships. 3. Appropriately grieve the loss in order to normalize mood and to return to previously adaptive level of functioning. 4. Become as knowledgeable as possible about the diagnosed condition and about living as normally as possible. 5. Develop a consistent, positive self-image. 6. Develop healthy interpersonal relationships that lead to the alleviation and help prevent the relapse of depression. 7. Develop healthy thinking patterns and beliefs about self, others, and the world that lead to the alleviation and help prevent the relapse of depression. 8. Elevate self-esteem. Objective Increase insight into the historical and current sources of low self-esteem. Target Date: 2023-03-14 Frequency: biweekly  Progress: 25  Modality: individual  Related Interventions Help the client become aware of his/her fear of rejection and its connection with past rejection or abandonment experiences; begin to contrast past experiences of pain with present experiences of acceptance and competence. Objective Decrease the frequency of negative self-descriptive statements and increase frequency of positive self-descriptive statements. Target Date: 2023-03-14 Frequency: biweekly  Progress: 25 Modality: individual  Related Interventions Assist the client in becoming aware of how he/she expresses or acts out negative feelings about himself/herself. Help the client reframe his/her negative assessment of himself/herself. Assist the client in developing positive self-talk as a way of boosting his/her confidence and self-image (or  assign "Positive Self-Talk" in the Adult Psychotherapy Homework Planner by Bryn Gulling). Objective Identify and replace negative self-talk messages used to reinforce low self-esteem. Target Date: 2023-03-14 Frequency: biweekly  Progress: 25 Modality: individual  Related Interventions Help the client identify his/her distorted, negative beliefs about self and the world and replace these messages with more realistic, affirmative messages (or assign "Journal and Replace Self-Defeating Thoughts" in the Adult Psychotherapy Homework Planner by [facility] or read What to Say When You Talk to Yourself by Helmstetter). Objective Form realistic, appropriate, and attainable goals for self in all areas of life. Target Date: 2023-03-14 Frequency: biweekly  Progress: 20 Modality: individual  Related Interventions Help the client analyze his/her goals to make sure they are realistic and attainable. Assign the client to make a list of goals for various areas of life and a plan for steps toward goal attainment. Objective Positively acknowledge verbal compliments from others. Target Date: 2023-03-14 Frequency: biweekly   Progress: 30 Modality: individual  Related Interventions Assign the client to be aware of and acknowledge graciously (without discounting) praise and compliments from others. Objective Demonstrate an increased ability to identify and express personal feelings. Target Date: 2023-03-14 Frequency: biweekly  Progress: 35 Modality: individual  Related Interventions Assist the client in identifying and labeling emotions. Objective Identify positive traits and talents about self. Target Date: 2023-03-14 Frequency: biweekly  Progress: 50 Modality: individual  Related Interventions Assign the client the exercise of identifying his/her positive physical characteristics in a mirror to help him/her become more comfortable with himself/herself. Objective Articulate a plan to be proactive in trying to get identified needs met. Target Date: 2023-03-14 Frequency: biweekly  Progress: 25 Modality: individual  Related Interventions Assist the client in identifying and verbalizing his/her needs, met and unmet. Assist the client in developing a specific action plan to get each need met (or assign "Satisfying Unmet Emotional Needs" in the Adult Psychotherapy Homework Planner by [facility]). 9. Enhance ability to effectively cope with the full variety of life's worries and anxieties. 10. Establish an inward sense of self-worth, confidence, and competence. 11. Interact socially without undue distress or disability. 12. Learn and implement coping skills that result in a reduction of anxiety and worry, and improved daily functioning. 13. Medically stabilize physical condition. Objective Identify the losses or limitations that have been experienced due to the medical condition. Target Date: 2023-03-14 Frequency: biweekly  Progress: 20 Modality: individual  Related Interventions Ask the client to list the changes, losses, or limitations that have resulted from the medical condition (or assign "The Impact of My  Illness" in the Adult Psychotherapy Homework Planner by Bryn Gulling). Objective Engage in social, productive, and recreational activities that are possible in spite of medical condition. Target Date: 2023-03-14 Frequency: biweekly  Progress: 20 Modality: individual  Related Interventions Sort out with the client activities that he/she can still enjoy either alone or with others (or assign "Identify and Schedule Pleasant Activities" in the Adult Psychotherapy Homework Planner by Bryn Gulling). Solicit a commitment from the client to increase his/her activity level by engaging in enjoyable and challenging activities; reinforce such engagement. Objective Learn and implement skills for preventing lapses back into more stressful reactions. Target Date: 2023-03-14 Frequency: biweekly  Progress: 30 Modality: individual  Related Interventions Teach the client relapse prevention skills including distinguishing between a lapse and relapse, identifying and rehearsing the management of high-risk situations using skills learned in therapy, building a less stressful lifestyle, and periodically attending "booster" sessions of therapy. Objective Demonstrate mastery of coping skills by applying them  to daily life situations. Target Date: 2023-03-14 Frequency: biweekly  Progress: 30 Modality: individual  Related Interventions Encourage skill development by having the client rehearse and practice coping skills in session through imaginal and/or behavioral rehearsal. Facilitate generalization of skills into everyday life by assigning homework (e.g., "Plan Before Acting" or "Journal and Replace Self-Defeating Thoughts" in the Adult Psychotherapy Homework Planner by Bryn Gulling) in which the patient applies coping skills in graduating more demanding stressful situations; review, reinforcing success and problem-solving obstacles toward effective use of skills. Help the client internalize his/her new skill set and build  self-efficacy by ensuring that the client "takes credit" for improvement and makes self-attributions for change. Objective Learn and implement skills for managing stress. Target Date: 2023-03-14 Frequency: biweekly  Progress: 40 Modality: individual  Related Interventions Conduct skills training, building upon effective coping strategies the client possesses, and teaching new skills tailored to the specific stressor. Train problem-focused personal and interpersonal coping skills (e.g., problem-solving, communication, conflict resolution, accessing social supports). Train emotionally focused coping skills (e.g., calming skills, perspective taking, emotional regulation, cognitive reframing). Objective Work with therapist to develop a plan for coping with stress. Target Date: 2023-03-14 Frequency: biweekly  Progress: 50 Modality: individual  Related Interventions Assist the client in developing a tailored coping action plan for preventing and/or managing identified stressful reactions using skills such as relaxation, exercise, cognitive reframing, and problem-solving. Objective Verbalize an increased understanding of the steps to grieving the losses brought on by the medical condition. Target Date: 2023-03-14 Frequency: biweekly  Progress: 40 Modality: individual  Related Interventions Educate the client on the stages of the grieving process and answer any questions that he/she may have. 14. Recognize, accept, and cope with feelings of depression. 15. Reduce fear, anxiety, and worry associated with the medical condition. 16. Reduce overall frequency, intensity, and duration of the anxiety so that daily functioning is not impaired. 17. Resolve the core conflict that is the source of anxiety. 18. Stabilize anxiety level while increasing ability to function on a daily basis. Objective Describe situations, thoughts, feelings, and actions associated with anxieties and worries, their impact on  functioning, and attempts to resolve them. Target Date: 2023-03-14 Frequency: biweekly  Progress: 30 Modality: individual  Related Interventions Ask the client to describe his/her past experiences of anxiety and their impact on functioning; assess the focus, excessiveness, and uncontrollability of the worry and the type, frequency, intensity, and duration of his/her anxiety symptoms (consider using a structured interview such as The Anxiety Disorders Interview Schedule-Adult Version). Objective Verbalize an understanding of the cognitive, physiological, and behavioral components of anxiety and its treatment. Target Date: 2023-03-14 Frequency: biweekly  Progress: 30 Modality: individual  Related Interventions Discuss how generalized anxiety typically involves excessive worry about unrealistic threats, various bodily expressions of tension, overarousal, and hypervigilance, and avoidance of what is threatening that interact to maintain the problem (see Mastery of Your Anxiety and Worry: Therapist Guide by Corky Mull, and Barlow; Treating Generalized Anxiety Disorder by Rygh and Amparo Bristol). Discuss how treatment targets worry, anxiety symptoms, and avoidance to help the client manage worry effectively, reduce overarousal, and eliminate unnecessary avoidance. Objective Learn and implement calming skills to reduce overall anxiety and manage anxiety symptoms. Target Date: 2023-03-14 Frequency: biweekly  Progress: 35 Modality: individual  Related Interventions Teach the client calming/relaxation skills (e.g., applied relaxation, progressive muscle relaxation, cue controlled relaxation; mindful breathing; biofeedback) and how to discriminate better between relaxation and tension; teach the client how to apply these skills to his/her daily life (e.g., New  Directions in Progressive Muscle Relaxation by Casper Harrison, and Hazlett-Stevens; Treating Generalized Anxiety Disorder by Rygh and  Amparo Bristol). Assign the client homework each session in which he/she practices relaxation exercises daily, gradually applying them progressively from non-anxiety-provoking to anxiety-provoking situations; review and reinforce success while providing corrective feedback toward improvement. Objective Verbalize an understanding of the role that cognitive biases play in excessive irrational worry and persistent anxiety symptoms. Target Date: 2023-03-14 Frequency: biweekly  Progress: 60 Modality: individual  Related Interventions Discuss examples demonstrating that unrealistic worry typically overestimates the probability of threats and underestimates or overlooks the client's ability to manage realistic demands (or assign "Past Successful Anxiety Coping" in the Adult Psychotherapy Homework Planner by [facility]). Assist the client in analyzing his/her worries by examining potential biases such as the probability of the negative expectation occurring, the real consequences of it occurring, his/her ability to control the outcome, the worst possible outcome, and his/her ability to accept it (see "Analyze the Probability of a Feared Event" in the Adult Psychotherapy Homework Planner by Bryn Gulling; Cognitive Therapy of Anxiety Disorders by Alison Stalling). Objective Identify, challenge, and replace biased, fearful self-talk with positive, realistic, and empowering self-talk. Target Date: 2023-03-14 Frequency: biweekly  Progress: 30 Modality: individual  Related Interventions Explore the client's schema and self-talk that mediate his/her fear response; assist him/her in challenging the biases; replace the distorted messages with reality-based alternatives and positive, realistic self-talk that will increase his/her self-confidence in coping with irrational fears (see Cognitive Therapy of Anxiety Disorders by Alison Stalling). Assign the client a homework exercise in which he/she identifies fearful self-talk, identifies  biases in the self-talk, generates alternatives, and tests through behavioral experiments (or assign "Negative Thoughts Trigger Negative Feelings" in the Adult Psychotherapy Homework Planner by [facility]); review and reinforce success, providing corrective feedback toward improvement. Objective Identify and engage in pleasant activities on a daily basis. Target Date: 2023-03-14 Frequency: biweekly  Progress: 30 Modality: individual  Related Interventions Engage the client in behavioral activation, increasing the client's contact with sources of reward, identifying processes that inhibit activation, and teaching skills to solve life problems (or assign "Identify and Schedule Pleasant Activities" in the Adult Psychotherapy Homework Planner by Jongsma); use behavioral techniques such as instruction, rehearsal, role-playing, role reversal as needed to assist adoption into the client's daily life; reinforce success. Objective Learn and implement relapse prevention strategies for managing possible future anxiety symptoms. Target Date: 2023-03-14 Frequency: biweekly  Progress: 30 Modality: individual  Related Interventions Discuss with the client the distinction between a lapse and relapse, associating a lapse with an initial and reversible return of worry, anxiety symptoms, or urges to avoid, and relapse with the decision to continue the fearful and avoidant patterns. Identify and rehearse with the client the management of future situations or circumstances in which lapses could occur. Develop a "coping card" on which coping strategies and other important information (e.g., "Breathe deeply and relax," "Challenge unrealistic worries," "Use problem-solving") are written for the client's later use. Instruct the client to routinely use new therapeutic skills (e.g., relaxation, cognitive restructuring, exposure, and problem-solving) in daily life to address emergent worries, anxiety, and avoidant  tendencies. Objective Learn to accept limitations in life and commit to tolerating, rather than avoiding, unpleasant emotions while accomplishing meaningful goals. Target Date: 2023-03-14 Frequency: biweekly  Progress: 40 Modality: individual  Related Interventions Use techniques from Acceptance and Commitment Therapy to help client accept uncomfortable realities such as lack of complete control, imperfections, and uncertainty and tolerate unpleasant emotions and thoughts  in order to accomplish value-consistent goals.  Diagnosis:Major depressive disorder, recurrent episode, moderate (HCC)  Generalized anxiety disorder  Plan:   -meet again on Tuesday, August 02, 2022 at 5pm by phone.

## 2022-08-02 ENCOUNTER — Ambulatory Visit: Payer: 59 | Admitting: Professional

## 2022-08-02 ENCOUNTER — Encounter: Payer: Self-pay | Admitting: Professional

## 2022-08-02 DIAGNOSIS — F411 Generalized anxiety disorder: Secondary | ICD-10-CM | POA: Diagnosis not present

## 2022-08-02 DIAGNOSIS — F331 Major depressive disorder, recurrent, moderate: Secondary | ICD-10-CM | POA: Diagnosis not present

## 2022-08-02 NOTE — Progress Notes (Signed)
[facility] Counselor/Therapist Progress Note  Patient ID: [name] Brown, MRN: [id number],    Date: 08/02/2022  Time Spent:  39 minutes 5-539pm  Treatment Type: Individual Therapy  Risk Assessment: Danger to Self:  No Self-injurious Behavior: No Danger to Others: No Duty to Warn:no  Subjective: This session was held via audio teletherapy The patient consented to a phone call and was located in his truck during this session. He is aware it is the responsibility of the patient to secure confidentiality on his end of the session. The provider was in a private home office for the duration of this session.    The patient arrived late for his telephone session.   Issues addressed:  1-hand surgery -went well and recovering nicely -learned that he will not take a local in the future due to the sounds that occur -brother was with him for support -able to return to work on light duty the next day 2-grief -still working through loss of mother -believes he is grieving in a healthy way admitting that he has his moments -pt is glad that his mother is no longer suffering -she lived a long and happy life and he is thankful Administrator, [employer] -struggling with his health -pt trying to spend as much time as possible since accepting the reality of death -pt calls his dad or sibling daily to check in 4-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 Accept the illness, and adapt life to the necessary limitations. 2. Alleviate depressive symptoms and return to previous level of effective functioning. Objective Verbalize an accurate understanding of depression. Target Date: 2023-03-14 Frequency: biweekly  Progress: 50 Modality: individual  Related Interventions Consistent with the treatment model,  discuss how cognitive, behavioral, interpersonal, and/or other factors (e.g., family history) contribute to depression. Objective Identify and replace thoughts and beliefs that support depression. Target Date: 2023-03-14 Frequency: biweekly  Progress: 50 Modality: individual  Related Interventions Assign the client to self-monitor thoughts, feelings, and actions in daily journal (e.g., "Negative Thoughts Trigger Negative Feelings" in the Adult Psychotherapy Homework Planner by Jongsma; "Daily Record of Dysfunctional Thoughts" in Cognitive Therapy of Depression by Lupita Shutter, and Warren Lacy); process the journal material to challenge depressive thinking patterns and replace them with reality-based thoughts. Assign "behavioral experiments" in which depressive automatic thoughts are treated as hypotheses/prediction, reality-based alternative hypotheses/prediction are generated, and both are tested against the client's past, present, and/or future experiences. Facilitate and reinforce the client's shift from biased depressive self-talk and beliefs to reality-based cognitive messages that enhance self-confidence and increase adaptive actions (see "Positive Self-Talk" in the Adult Psychotherapy Homework Planner by Bryn Gulling). Objective Learn and implement behavioral strategies to overcome depression. Target Date: 2023-03-14 Frequency: biweekly  Progress: 30 Modality: individual  Related Interventions Engage the client in "behavioral activation," increasing his/her activity level and contact with sources of reward, while identifying processes that inhibit activation (see Behavioral Activation for Depression by Beverly Gust, Dimidjian, and Herman-Dunn; or assign "Identify and Schedule Pleasant Activities" in the Adult Psychotherapy Homework Planner by [facility]); use behavioral techniques such as instruction, rehearsal, role-playing, role reversal, as needed, to facilitate activity in the client's daily life;  reinforce success. Assist the client in developing skills that increase the likelihood of deriving pleasure from behavioral activation (e.g., assertiveness skills, developing an exercise plan, less internal/more external focus, increased  social involvement); reinforce success. Objective Learn and implement problem-solving and decision-making skills. Target Date: 2023-03-14 Frequency: biweekly  Progress: 30 Modality: individual  Related Interventions Encourage in the client the development of a positive problem orientation in which problems and solving them are viewed as a natural part of life and not something to be feared, despaired, or avoided. Conduct Problem-Solving Therapy (see Problem-Solving Therapy by Shawnee Knapp and Delane Ginger) using techniques such as psychoeducation, modeling, and role-playing to teach client problem-solving skills (i.e., defining a problem specifically, generating possible solutions, evaluating the pros and cons of each solution, selecting and implementing a plan of action, evaluating the efficacy of the plan, accepting or revising the plan); role-play application of the problem-solving skill to a real life issue (or assign "Applying Problem-Solving to Interpersonal Conflict" in the Adult Psychotherapy Homework Planner by Bryn Gulling). Objective Verbalize an understanding of healthy and unhealthy emotions with the intent of increasing the use of healthy emotions to guide actions. Target Date: 2023-03-14 Frequency: biweekly  Progress: 25 Modality: individual  Related Interventions Use a process-experiential approach consistent with Emotion-Focused Therapy to create a safe, nurturing environment in which the client can process emotions, learning to identify and regulate unhealthy feelings and to generate more adaptive ones that then guide actions (see Emotion-Focused Therapy for Depression by Andrey Spearman). Objective Verbalize insight into how past relationships may be  influencing current experiences with depression. Target Date: 2023-03-14 Frequency: biweekly  Progress: 20 Modality: individual  Related Interventions Explore experiences from the client's childhood that contribute to current depressed state. Explain a connection between previously unexpressed (repressed) feelings of anger (and helplessness) and current state of depression. Objective Increasingly verbalize hopeful and positive statements regarding self, others, and the future. Target Date: 2023-03-14 Frequency: biweekly  Progress: 30 Modality: individual  Related Interventions Teach the client more about depression and how to recognize and accept some sadness as a normal variation in feeling. Assign the client to write at least one positive affirmation statement daily regarding himself/herself and the future (or assign "Positive Self-Talk" in the Adult Psychotherapy Homework Planner by Bryn Gulling). Objective Verbalize an understanding and resolution of current interpersonal problems. Target Date: 2023-03-14 Frequency: biweekly  Progress: 30 Modality: individual  Related Interventions For role transitions (e.g., beginning or ending a relationship or career, moving, promotion, retirement, graduation), help the client mourn the loss of the old role while recognizing positive and negative aspects of the new role, and taking steps to gain mastery over the new role. For interpersonal deficits, help the client develop new interpersonal skills and relationships. 3. Appropriately grieve the loss in order to normalize mood and to return to previously adaptive level of functioning. 4. Become as knowledgeable as possible about the diagnosed condition and about living as normally as possible. 5. Develop a consistent, positive self-image. 6. Develop healthy interpersonal relationships that lead to the alleviation and help prevent the relapse of depression. 7. Develop healthy thinking patterns and beliefs  about self, others, and the world that lead to the alleviation and help prevent the relapse of depression. 8. Elevate self-esteem. Objective Increase insight into the historical and current sources of low self-esteem. Target Date: 2023-03-14 Frequency: biweekly  Progress: 25 Modality: individual  Related Interventions Help the client become aware of his/her fear of rejection and its connection with past rejection or abandonment experiences; begin to contrast past experiences of pain with present experiences of acceptance and competence. Objective Decrease the frequency of negative self-descriptive statements and increase frequency of positive self-descriptive statements. Target Date: 2023-03-14  Frequency: biweekly  Progress: 25 Modality: individual  Related Interventions Assist the client in becoming aware of how he/she expresses or acts out negative feelings about himself/herself. Help the client reframe his/her negative assessment of himself/herself. Assist the client in developing positive self-talk as a way of boosting his/her confidence and self-image (or assign "Positive Self-Talk" in the Adult Psychotherapy Homework Planner by Bryn Gulling). Objective Identify and replace negative self-talk messages used to reinforce low self-esteem. Target Date: 2023-03-14 Frequency: biweekly  Progress: 25 Modality: individual  Related Interventions Help the client identify his/her distorted, negative beliefs about self and the world and replace these messages with more realistic, affirmative messages (or assign "Journal and Replace Self-Defeating Thoughts" in the Adult Psychotherapy Homework Planner by [facility] or read What to Say When You Talk to Yourself by Helmstetter). Objective Form realistic, appropriate, and attainable goals for self in all areas of life. Target Date: 2023-03-14 Frequency: biweekly  Progress: 20 Modality: individual  Related Interventions Help the client analyze his/her goals to  make sure they are realistic and attainable. Assign the client to make a list of goals for various areas of life and a plan for steps toward goal attainment. Objective Positively acknowledge verbal compliments from others. Target Date: 2023-03-14 Frequency: biweekly  Progress: 30 Modality: individual  Related Interventions Assign the client to be aware of and acknowledge graciously (without discounting) praise and compliments from others. Objective Demonstrate an increased ability to identify and express personal feelings. Target Date: 2023-03-14 Frequency: biweekly  Progress: 35 Modality: individual  Related Interventions Assist the client in identifying and labeling emotions. Objective Identify positive traits and talents about self. Target Date: 2023-03-14 Frequency: biweekly  Progress: 50 Modality: individual  Related Interventions Assign the client the exercise of identifying his/her positive physical characteristics in a mirror to help him/her become more comfortable with himself/herself. Objective Articulate a plan to be proactive in trying to get identified needs met. Target Date: 2023-03-14 Frequency: biweekly  Progress: 25 Modality: individual  Related Interventions Assist the client in identifying and verbalizing his/her needs, met and unmet. Assist the client in developing a specific action plan to get each need met (or assign "Satisfying Unmet Emotional Needs" in the Adult Psychotherapy Homework Planner by [facility]). 9. Enhance ability to effectively cope with the full variety of life's worries and anxieties. 10. Establish an inward sense of self-worth, confidence, and competence. 11. Interact socially without undue distress or disability. 12. Learn and implement coping skills that result in a reduction of anxiety and worry, and improved daily functioning. 13. Medically stabilize physical condition. Objective Identify the losses or limitations that have been experienced  due to the medical condition. Target Date: 2023-03-14 Frequency: biweekly  Progress: 20 Modality: individual  Related Interventions Ask the client to list the changes, losses, or limitations that have resulted from the medical condition (or assign "The Impact of My Illness" in the Adult Psychotherapy Homework Planner by Bryn Gulling). Objective Engage in social, productive, and recreational activities that are possible in spite of medical condition. Target Date: 2023-03-14 Frequency: biweekly  Progress: 20 Modality: individual  Related Interventions Sort out with the client activities that he/she can still enjoy either alone or with others (or assign "Identify and Schedule Pleasant Activities" in the Adult Psychotherapy Homework Planner by Bryn Gulling). Solicit a commitment from the client to increase his/her activity level by engaging in enjoyable and challenging activities; reinforce such engagement. Objective Learn and implement skills for preventing lapses back into more stressful reactions. Target Date: 2023-03-14 Frequency: biweekly  Progress:  30 Modality: individual  Related Interventions Teach the client relapse prevention skills including distinguishing between a lapse and relapse, identifying and rehearsing the management of high-risk situations using skills learned in therapy, building a less stressful lifestyle, and periodically attending "booster" sessions of therapy. Objective Demonstrate mastery of coping skills by applying them to daily life situations. Target Date: 2023-03-14 Frequency: biweekly  Progress: 30 Modality: individual  Related Interventions Encourage skill development by having the client rehearse and practice coping skills in session through imaginal and/or behavioral rehearsal. Facilitate generalization of skills into everyday life by assigning homework (e.g., "Plan Before Acting" or "Journal and Replace Self-Defeating Thoughts" in the Adult Psychotherapy Homework  Planner by Bryn Gulling) in which the patient applies coping skills in graduating more demanding stressful situations; review, reinforcing success and problem-solving obstacles toward effective use of skills. Help the client internalize his/her new skill set and build self-efficacy by ensuring that the client "takes credit" for improvement and makes self-attributions for change. Objective Learn and implement skills for managing stress. Target Date: 2023-03-14 Frequency: biweekly  Progress: 40 Modality: individual  Related Interventions Conduct skills training, building upon effective coping strategies the client possesses, and teaching new skills tailored to the specific stressor. Train problem-focused personal and interpersonal coping skills (e.g., problem-solving, communication, conflict resolution, accessing social supports). Train emotionally focused coping skills (e.g., calming skills, perspective taking, emotional regulation, cognitive reframing). Objective Work with therapist to develop a plan for coping with stress. Target Date: 2023-03-14 Frequency: biweekly  Progress: 50 Modality: individual  Related Interventions Assist the client in developing a tailored coping action plan for preventing and/or managing identified stressful reactions using skills such as relaxation, exercise, cognitive reframing, and problem-solving. Objective Verbalize an increased understanding of the steps to grieving the losses brought on by the medical condition. Target Date: 2023-03-14 Frequency: biweekly  Progress: 40 Modality: individual  Related Interventions Educate the client on the stages of the grieving process and answer any questions that he/she may have. 14. Recognize, accept, and cope with feelings of depression. 15. Reduce fear, anxiety, and worry associated with the medical condition. 16. Reduce overall frequency, intensity, and duration of the anxiety so that daily functioning is not impaired. 17.  Resolve the core conflict that is the source of anxiety. 18. Stabilize anxiety level while increasing ability to function on a daily basis. Objective Describe situations, thoughts, feelings, and actions associated with anxieties and worries, their impact on functioning, and attempts to resolve them. Target Date: 2023-03-14 Frequency: biweekly  Progress: 30 Modality: individual  Related Interventions Ask the client to describe his/her past experiences of anxiety and their impact on functioning; assess the focus, excessiveness, and uncontrollability of the worry and the type, frequency, intensity, and duration of his/her anxiety symptoms (consider using a structured interview such as The Anxiety Disorders Interview Schedule-Adult Version). Objective Verbalize an understanding of the cognitive, physiological, and behavioral components of anxiety and its treatment. Target Date: 2023-03-14 Frequency: biweekly  Progress: 30 Modality: individual  Related Interventions Discuss how generalized anxiety typically involves excessive worry about unrealistic threats, various bodily expressions of tension, overarousal, and hypervigilance, and avoidance of what is threatening that interact to maintain the problem (see Mastery of Your Anxiety and Worry: Therapist Guide by Corky Mull, and Barlow; Treating Generalized Anxiety Disorder by Rygh and Amparo Bristol). Discuss how treatment targets worry, anxiety symptoms, and avoidance to help the client manage worry effectively, reduce overarousal, and eliminate unnecessary avoidance. Objective Learn and implement calming skills to reduce overall anxiety and manage anxiety symptoms.  Target Date: 2023-03-14 Frequency: biweekly  Progress: 35 Modality: individual  Related Interventions Teach the client calming/relaxation skills (e.g., applied relaxation, progressive muscle relaxation, cue controlled relaxation; mindful breathing; biofeedback) and how to discriminate  better between relaxation and tension; teach the client how to apply these skills to his/her daily life (e.g., New Directions in Progressive Muscle Relaxation by Casper [facility], and Hazlett-Stevens; Treating Generalized Anxiety Disorder by Rygh and Amparo Bristol). Assign the client homework each session in which he/she practices relaxation exercises daily, gradually applying them progressively from non-anxiety-provoking to anxiety-provoking situations; review and reinforce success while providing corrective feedback toward improvement. Objective Verbalize an understanding of the role that cognitive biases play in excessive irrational worry and persistent anxiety symptoms. Target Date: 2023-03-14 Frequency: biweekly  Progress: 60 Modality: individual  Related Interventions Discuss examples demonstrating that unrealistic worry typically overestimates the probability of threats and underestimates or overlooks the client's ability to manage realistic demands (or assign "Past Successful Anxiety Coping" in the Adult Psychotherapy Homework Planner by [facility]). Assist the client in analyzing his/her worries by examining potential biases such as the probability of the negative expectation occurring, the real consequences of it occurring, his/her ability to control the outcome, the worst possible outcome, and his/her ability to accept it (see "Analyze the Probability of a Feared Event" in the Adult Psychotherapy Homework Planner by Bryn Gulling; Cognitive Therapy of Anxiety Disorders by Alison Stalling). Objective Identify, challenge, and replace biased, fearful self-talk with positive, realistic, and empowering self-talk. Target Date: 2023-03-14 Frequency: biweekly  Progress: 30 Modality: individual  Related Interventions Explore the client's schema and self-talk that mediate his/her fear response; assist him/her in challenging the biases; replace the distorted messages with reality-based alternatives and positive,  realistic self-talk that will increase his/her self-confidence in coping with irrational fears (see Cognitive Therapy of Anxiety Disorders by Alison Stalling). Assign the client a homework exercise in which he/she identifies fearful self-talk, identifies biases in the self-talk, generates alternatives, and tests through behavioral experiments (or assign "Negative Thoughts Trigger Negative Feelings" in the Adult Psychotherapy Homework Planner by [facility]); review and reinforce success, providing corrective feedback toward improvement. Objective Identify and engage in pleasant activities on a daily basis. Target Date: 2023-03-14 Frequency: biweekly  Progress: 30 Modality: individual  Related Interventions Engage the client in behavioral activation, increasing the client's contact with sources of reward, identifying processes that inhibit activation, and teaching skills to solve life problems (or assign "Identify and Schedule Pleasant Activities" in the Adult Psychotherapy Homework Planner by Jongsma); use behavioral techniques such as instruction, rehearsal, role-playing, role reversal as needed to assist adoption into the client's daily life; reinforce success. Objective Learn and implement relapse prevention strategies for managing possible future anxiety symptoms. Target Date: 2023-03-14 Frequency: biweekly  Progress: 30 Modality: individual  Related Interventions Discuss with the client the distinction between a lapse and relapse, associating a lapse with an initial and reversible return of worry, anxiety symptoms, or urges to avoid, and relapse with the decision to continue the fearful and avoidant patterns. Identify and rehearse with the client the management of future situations or circumstances in which lapses could occur. Develop a "coping card" on which coping strategies and other important information (e.g., "Breathe deeply and relax," "Challenge unrealistic worries," "Use problem-solving") are  written for the client's later use. Instruct the client to routinely use new therapeutic skills (e.g., relaxation, cognitive restructuring, exposure, and problem-solving) in daily life to address emergent worries, anxiety, and avoidant tendencies. Objective Learn to accept limitations in life and  commit to tolerating, rather than avoiding, unpleasant emotions while accomplishing meaningful goals. Target Date: 2023-03-14 Frequency: biweekly  Progress: 40 Modality: individual  Related Interventions Use techniques from Acceptance and Commitment Therapy to help client accept uncomfortable realities such as lack of complete control, imperfections, and uncertainty and tolerate unpleasant emotions and thoughts in order to accomplish value-consistent goals.  Diagnosis:Major depressive disorder, recurrent episode, moderate (HCC)  Generalized anxiety disorder  Plan:  -pt will call as needed for an appointment. -pt educated on how to reach Clinician if needed

## 2022-08-05 ENCOUNTER — Ambulatory Visit: Payer: 59 | Admitting: Professional

## 2022-09-08 IMAGING — CT CT ABDOMEN W/ CM
2 of 3 series · 13 of 32 positions shown, 19 images · IV contrast (APPLIED)
Comparison: 04/26/2019 abdominal ultrasound. CT of 05/20/2015 from
[HOSPITAL].

CLINICAL DATA: Cirrhosis. Right adrenal gland tumor removed. Prior
hernia repair.

EXAM:
CT ABDOMEN WITH CONTRAST
TECHNIQUE: Multidetector CT imaging of the abdomen was performed using the
standard protocol following bolus administration of intravenous
contrast.
CONTRAST:  100mL Y4ZTYF-DWW IOPAMIDOL (Y4ZTYF-DWW) INJECTION 61%

[Series 2: abdroutine 5.0 i40s 1 · axial · 0.79mm/px · z∈[-294,-94]mm · 9 of 51 slices shown, 15 images]
[im 6/51  soft-tissue]
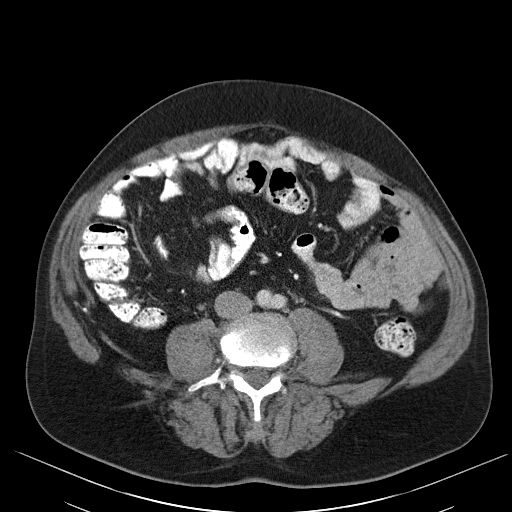
[im 6/51  bone]
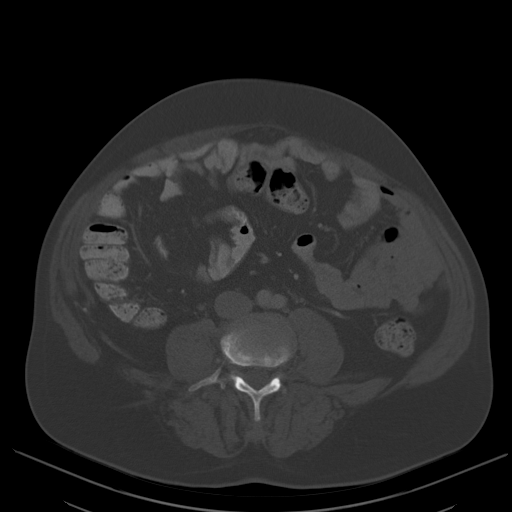
[im 11/51  soft-tissue]
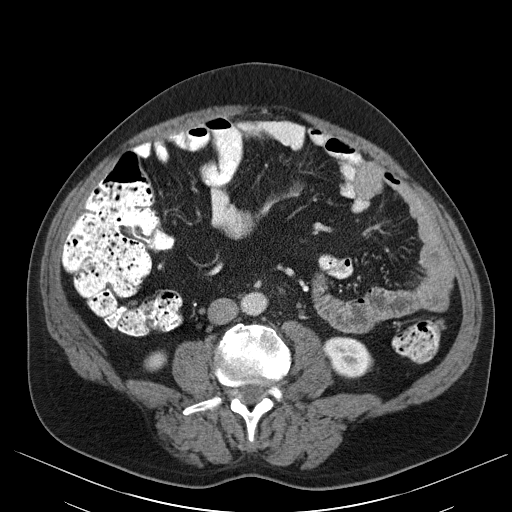
[im 16/51  soft-tissue]
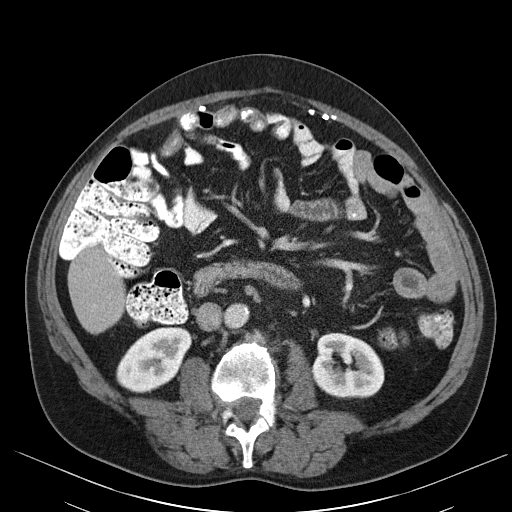
[im 21/51  soft-tissue]
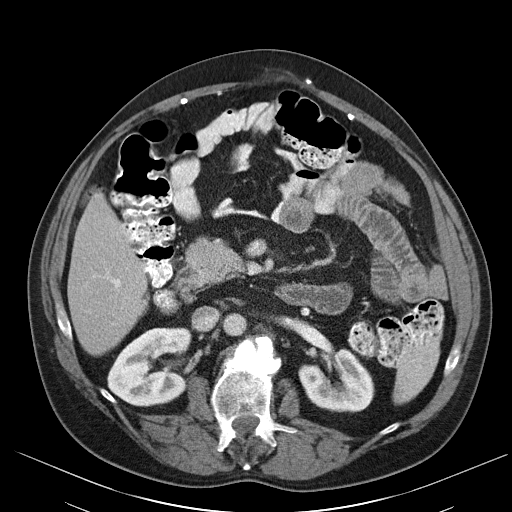
[im 26/51  soft-tissue]
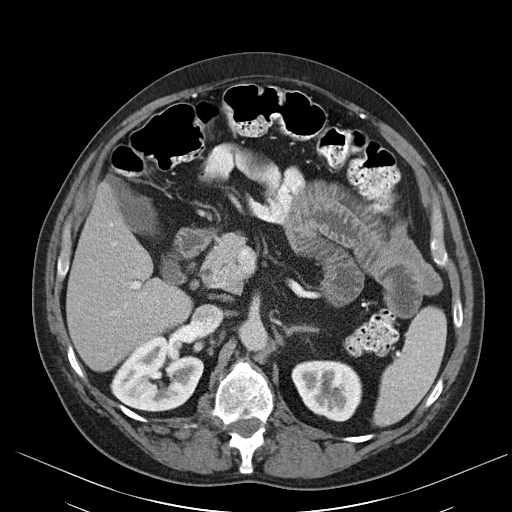
[im 31/51  soft-tissue]
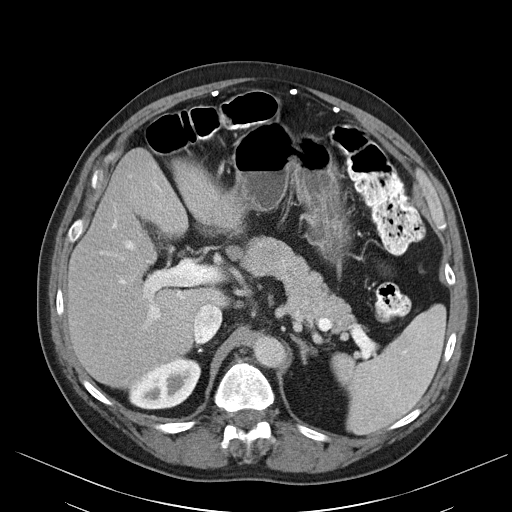
[im 31/51  lung]
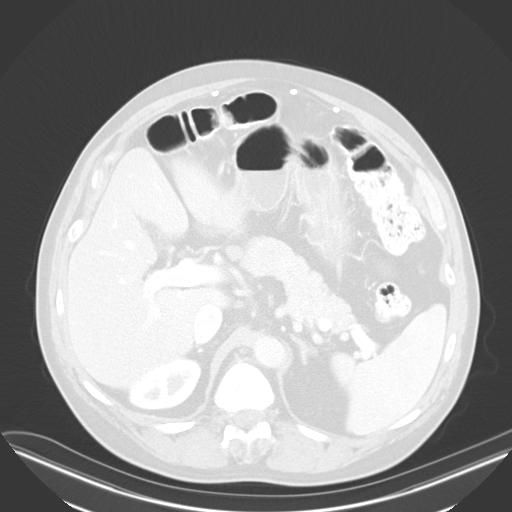
[im 36/51  soft-tissue]
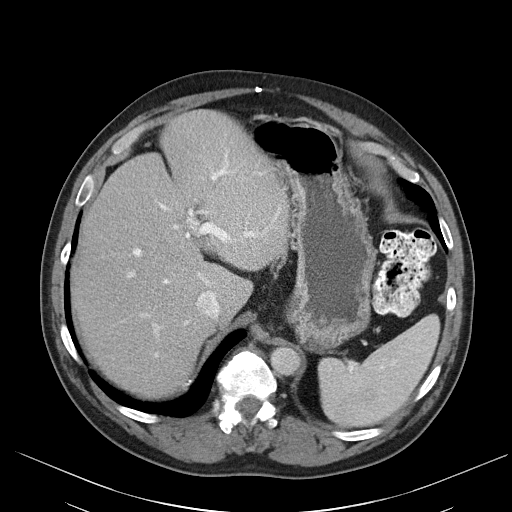
[im 36/51  lung]
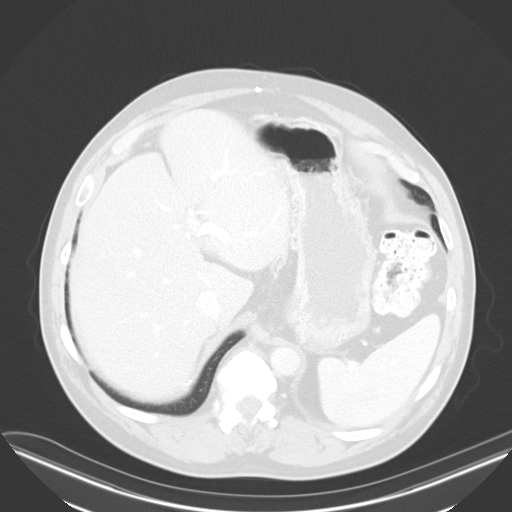
[im 41/51  soft-tissue]
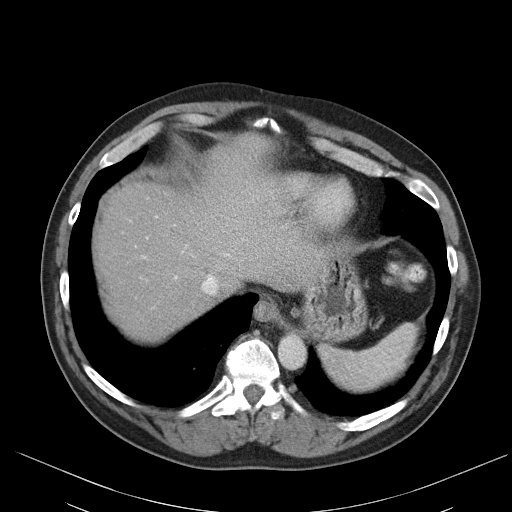
[im 41/51  lung]
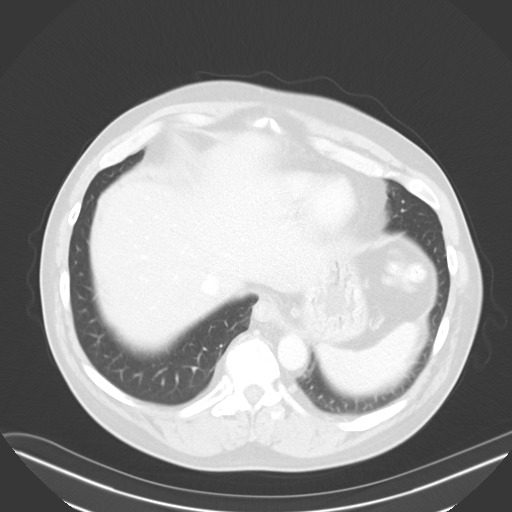
[im 46/51  soft-tissue]
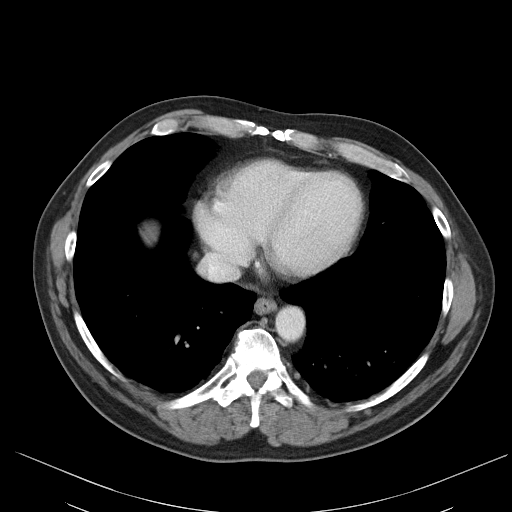
[im 46/51  lung]
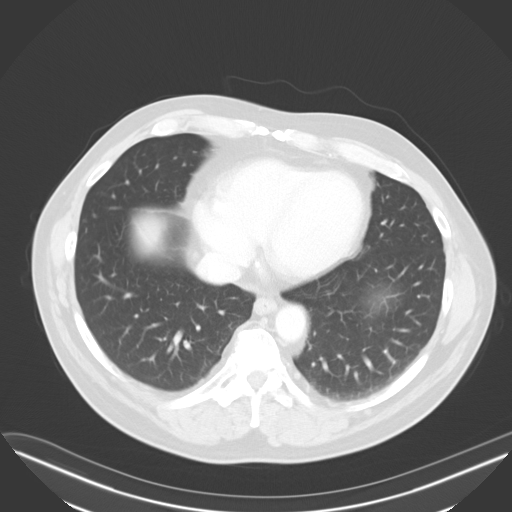
[im 46/51  bone]
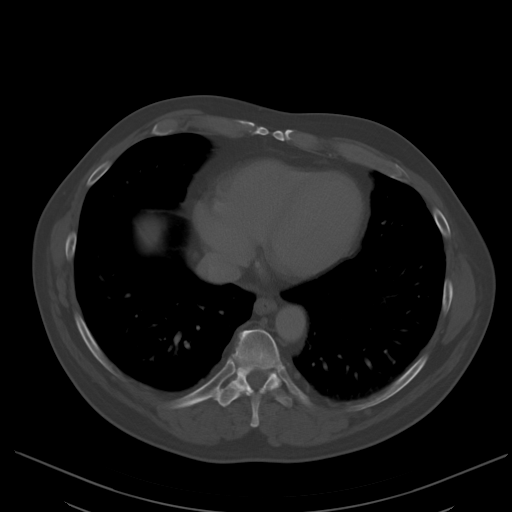

[Series 6: lungs · axial · 0.79mm/px · z∈[-172,-134]mm · 4 of 57 slices shown]
[im 5/57  soft-tissue]
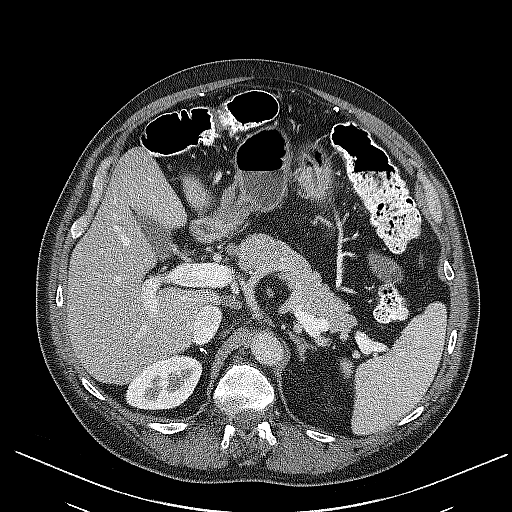
[im 15/57  soft-tissue]
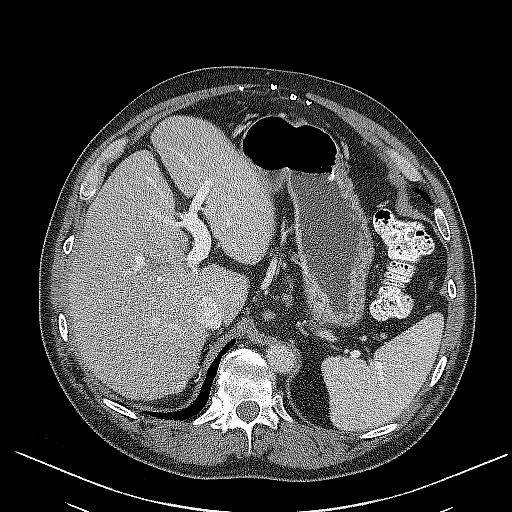
[im 19/57  soft-tissue]
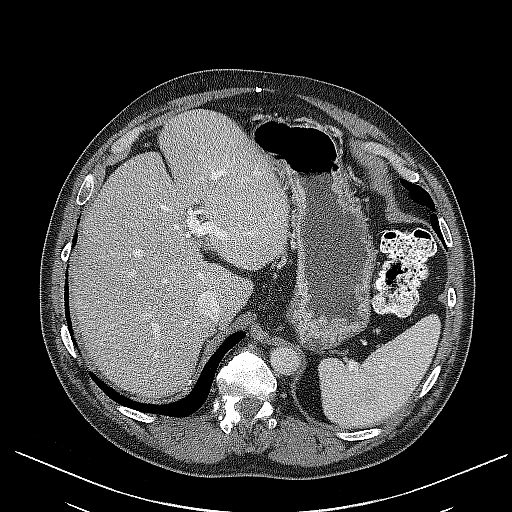
[im 24/57  soft-tissue]
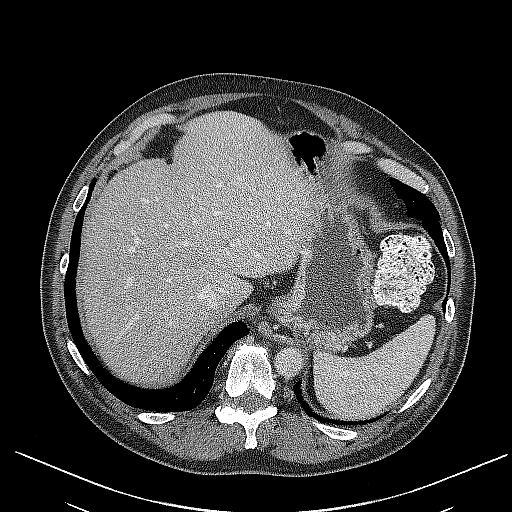

[13 of 32 positions shown; findings below may reference images not displayed]

FINDINGS: Lower chest: Clear lung bases. Normal heart size without pericardial
or pleural effusion.

Hepatobiliary: Mild cirrhosis, as evidenced by caudate and lateral
segment left liver lobe enlargement. Mild medial segment left liver
lobe atrophy. Although no arterial phase images were performed, no
suspicious liver lesion identified.

Normal gallbladder, without biliary ductal dilatation.

Pancreas: Normal, without mass or ductal dilatation.

Spleen: Tiny low-density splenic lesions x2 are of no clinical
significance. No splenomegaly.

Adrenals/Urinary Tract: Right adrenalectomy. Normal left adrenal
gland. Normal kidneys, without hydronephrosis.

Stomach/Bowel: Normal stomach, without wall thickening. Normal
abdominal bowel loops.

Vascular/Lymphatic: Aortic atherosclerosis. Patent portal and
splenic veins. Mildly prominent porta hepatis nodes are likely
reactive.

Other: No ascites. Prior ventral abdominal wall hernia repair. Mild
laxity within a portion of the hernia including on [DATE] contains
only fat.

Musculoskeletal: Lower thoracic and upper lumbar advanced
spondylosis and kyphosis. Mild convex right thoracolumbar spine
curvature.
IMPRESSION: 1. Cirrhosis without portal venous hypertension or hepatocellular
carcinoma.
2.  No acute abdominal process.
3. Status post right adrenalectomy.
4.  Aortic Atherosclerosis ([id number]).

## 2022-10-07 ENCOUNTER — Encounter: Payer: Self-pay | Admitting: Gastroenterology

## 2022-10-23 ENCOUNTER — Other Ambulatory Visit: Payer: Self-pay | Admitting: Family Medicine

## 2022-10-23 DIAGNOSIS — F411 Generalized anxiety disorder: Secondary | ICD-10-CM

## 2022-11-08 ENCOUNTER — Ambulatory Visit (AMBULATORY_SURGERY_CENTER): Payer: 59

## 2022-11-08 VITALS — Ht 68.0 in | Wt 195.0 lb

## 2022-11-08 DIAGNOSIS — Z86010 Personal history of colonic polyps: Secondary | ICD-10-CM

## 2022-11-08 MED ORDER — NA SULFATE-K SULFATE-MG SULF 17.5-3.13-1.6 GM/177ML PO SOLN
1.0000 | Freq: Once | ORAL | 0 refills | Status: AC
Start: 2022-11-08 — End: 2022-11-08

## 2022-11-08 NOTE — Progress Notes (Signed)

## 2022-11-22 ENCOUNTER — Encounter: Payer: Self-pay | Admitting: Gastroenterology

## 2022-11-28 ENCOUNTER — Telehealth: Payer: Self-pay | Admitting: Gastroenterology

## 2022-11-28 NOTE — Telephone Encounter (Signed)
Patient needs updated prep instructions.

## 2022-11-28 NOTE — Telephone Encounter (Signed)
New prep instructions sent via mychart hard copy will be mailed to address on file at end of day.

## 2022-11-29 ENCOUNTER — Other Ambulatory Visit: Payer: Self-pay | Admitting: Family Medicine

## 2022-11-30 ENCOUNTER — Encounter: Payer: 59 | Admitting: Gastroenterology

## 2022-12-30 ENCOUNTER — Telehealth: Payer: Self-pay | Admitting: Family Medicine

## 2022-12-30 DIAGNOSIS — Z125 Encounter for screening for malignant neoplasm of prostate: Secondary | ICD-10-CM

## 2022-12-30 DIAGNOSIS — F1010 Alcohol abuse, uncomplicated: Secondary | ICD-10-CM

## 2022-12-30 DIAGNOSIS — Z0000 Encounter for general adult medical examination without abnormal findings: Secondary | ICD-10-CM

## 2022-12-30 DIAGNOSIS — I10 Essential (primary) hypertension: Secondary | ICD-10-CM

## 2022-12-30 DIAGNOSIS — E782 Mixed hyperlipidemia: Secondary | ICD-10-CM

## 2022-12-30 DIAGNOSIS — K7030 Alcoholic cirrhosis of liver without ascites: Secondary | ICD-10-CM

## 2022-12-30 DIAGNOSIS — M109 Gout, unspecified: Secondary | ICD-10-CM

## 2022-12-30 NOTE — Telephone Encounter (Signed)
Pt called.  He is requesting a refill on his Buspirone. He is also requesting a refill on his Lactulose.  He is aware that pcp did not orignally prescribe this med.

## 2023-01-02 ENCOUNTER — Other Ambulatory Visit: Payer: Self-pay | Admitting: Family Medicine

## 2023-01-02 DIAGNOSIS — F411 Generalized anxiety disorder: Secondary | ICD-10-CM

## 2023-01-03 ENCOUNTER — Ambulatory Visit: Payer: Self-pay | Admitting: General Surgery

## 2023-01-03 NOTE — H&P (Signed)
REFERRING PHYSICIAN:  Sebastian Ache, MD  PROVIDER:  Elenora Gamma, MD  MRN: [id number] DOB: Feb 16, 1966 DATE OF ENCOUNTER: 01/03/2023  Subjective   Chief Complaint: No chief complaint on file.     History of Present Illness: [name] Brown is a 57 y.o. male who is seen today as an office consultation at the request of Dr. Berneice Heinrich for evaluation of No chief complaint on file. .  Patient presents to the office to discuss removal of anal warts.  He has been working with Dr. Berneice Heinrich for his genital warts but has an area that is refractory to medical treatment and Dr. Berneice Heinrich has recommended excision.  He has several warts around his anus that he would like removed as well.     Review of Systems: A complete review of systems was obtained from the patient.  I have reviewed this information and discussed as appropriate with the patient.  See HPI as well for other ROS.   Medical History: Past Medical History:  Diagnosis Date   Liver disease    Substance abuse (CMS/HHS-HCC)     There is no problem list on file for this patient.   Past Surgical History:  Procedure Laterality Date   ADRENALECTOMY     HERNIA REPAIR       No Known Allergies  Current Outpatient Medications on File Prior to Visit  Medication Sig Dispense Refill   allopurinoL (ZYLOPRIM) 300 MG tablet Take 1 tablet by mouth once daily     atorvastatin (LIPITOR) 10 MG tablet Take 10 mg by mouth once daily     busPIRone (BUSPAR) 15 MG tablet Take 15 mg by mouth 2 (two) times daily     escitalopram oxalate (LEXAPRO) 20 MG tablet Take 20 mg by mouth once daily     finasteride (PROSCAR) 5 mg tablet Take 5 mg by mouth once daily     imiquimod (ALDARA) 5 % cream APPLY TOPICALLY TO THE AFFECTED AREA 3 TIMES A WEEK TO GENITAL WART AFFECTED SKIN. CAN REDUCE USE TO TWICE WEEKLY IF IRRITATION     lactulose (CEPHULAC) 10 gram packet Take by mouth     No current facility-administered medications on file prior to visit.     Family History  Problem Relation Age of Onset   Skin cancer Father    Breast cancer Sister    Obesity Brother    High blood pressure (Hypertension) Brother    Coronary Artery Disease (Blocked arteries around heart) Brother    Diabetes Brother      Social History   Tobacco Use  Smoking Status Never  Smokeless Tobacco Never     Social History   Socioeconomic History   Marital status: Single  Tobacco Use   Smoking status: Never   Smokeless tobacco: Never  Vaping Use   Vaping status: Never Used  Substance and Sexual Activity   Alcohol use: Not Currently   Drug use: Yes    Types: Marijuana   Social Determinants of Health    Received from [employer]   Social Network    Objective:    Vitals:   01/03/23 1032 01/03/23 1033  BP: 120/76   Pulse: 78   Temp: 36.8 C (98.2 F)   SpO2: 99%   Weight: 89.8 kg (198 lb)   Height: 170.2 cm (5\' 7" )   PainSc:  0-No pain     Exam Gen: NAD Abd: soft Rectal: Multiple masses throughout the anterior region of the patient's perianal region.  Several  anterior internal condyloma noted as well on digital rectal exam.   Labs, Imaging and Diagnostic Testing: Urology notes reviewed.  Assessment and Plan:  Diagnoses and all orders for this visit:  Anal condyloma    56 year old male who presents to the office with multiple perianal condyloma.  We discussed that this will most likely require laser ablation internally and externally.  We discussed that this can have a lengthy and painful recovery of approximately 3-4 weeks.  This can be done as a combined procedure with Dr. Berneice Heinrich if needed.  Patient would like to think about the timing of surgery further.  We will call him next week and see what he decided.    Vanita Panda, MD Colon and Rectal Surgery [facility]

## 2023-01-04 NOTE — Telephone Encounter (Signed)
Patient has not been seen since 01/2022. Please schedule patient for medication refills with Dr. Ashley Royalty. Buspar sent for continuity.

## 2023-01-05 ENCOUNTER — Telehealth: Payer: Self-pay | Admitting: Gastroenterology

## 2023-01-05 NOTE — Telephone Encounter (Signed)
PT wants to know if he can get colonoscopy with anal warts Requesting call back

## 2023-01-06 NOTE — Telephone Encounter (Signed)
The pt states that he has anal warts and other concerns and wants to know If he can set up a colonoscopy.  I have advised that if he has questions and concerns he should make an appt with a provider to discuss. He has never been seen in the office only had colon in 2016 with Dr Christella Hartigan.  He has agreed and will call back to set up.

## 2023-01-08 NOTE — Addendum Note (Signed)
Addended by: Mammie Lorenzo on: 01/08/2023 09:33 PM   Modules accepted: Orders

## 2023-01-09 NOTE — Telephone Encounter (Signed)
Patient informed. 

## 2023-01-10 ENCOUNTER — Encounter: Payer: 59 | Admitting: Gastroenterology

## 2023-01-12 ENCOUNTER — Other Ambulatory Visit: Payer: Self-pay | Admitting: Urology

## 2023-01-23 ENCOUNTER — Encounter: Payer: Self-pay | Admitting: Family Medicine

## 2023-01-23 ENCOUNTER — Ambulatory Visit: Payer: 59 | Admitting: Family Medicine

## 2023-01-23 VITALS — BP 111/70 | HR 62 | Ht 68.0 in | Wt 195.0 lb

## 2023-01-23 DIAGNOSIS — M109 Gout, unspecified: Secondary | ICD-10-CM

## 2023-01-23 DIAGNOSIS — G4700 Insomnia, unspecified: Secondary | ICD-10-CM

## 2023-01-23 DIAGNOSIS — E782 Mixed hyperlipidemia: Secondary | ICD-10-CM

## 2023-01-23 DIAGNOSIS — F411 Generalized anxiety disorder: Secondary | ICD-10-CM | POA: Diagnosis not present

## 2023-01-23 DIAGNOSIS — K7030 Alcoholic cirrhosis of liver without ascites: Secondary | ICD-10-CM

## 2023-01-23 DIAGNOSIS — F1010 Alcohol abuse, uncomplicated: Secondary | ICD-10-CM

## 2023-01-23 MED ORDER — ATORVASTATIN CALCIUM 10 MG PO TABS: ORAL_TABLET | ORAL | 3 refills | Status: DC

## 2023-01-23 MED ORDER — ESCITALOPRAM OXALATE 20 MG PO TABS: 20.0000 mg | ORAL_TABLET | Freq: Every day | ORAL | 3 refills | Status: DC

## 2023-01-23 MED ORDER — BUSPIRONE HCL 15 MG PO TABS
ORAL_TABLET | ORAL | 0 refills | Status: DC
Start: 2023-01-23 — End: 2023-02-27

## 2023-01-23 MED ORDER — ZOLPIDEM TARTRATE 5 MG PO TABS
2.5000 mg | ORAL_TABLET | Freq: Every evening | ORAL | 1 refills | Status: DC | PRN
Start: 2023-01-23 — End: 2023-02-15

## 2023-01-23 MED ORDER — ALLOPURINOL 300 MG PO TABS
300.0000 mg | ORAL_TABLET | Freq: Every day | ORAL | 3 refills | Status: DC
Start: 2023-01-23 — End: 2024-02-21

## 2023-01-29 NOTE — Progress Notes (Signed)
[name] Brown - 57 y.o. male MRN [id number]  Date of birth: [birth year]  Subjective Chief Complaint  Patient presents with   Follow-up    HPI [name] Brown is a 57 y.o. male here today for follow up visit.   History of depression and anxiety history of alcohol abuse as well.  Current treatment with Lexapro and BuSpar.  He reports the medications continue to work pretty well for him at this time.  He has not noted any significant side effects.  He does use Ambien as needed for insomnia.  This is working well for him.   He does have cirrhosis related to his previous alcohol abuse.  He has been alcohol free for several years at this point.  He does follow with GI.  Remains on lactulose.  Tolerating atorvastatin well for hyperlipidemia at this time.  ROS:  A comprehensive ROS was completed and negative except as noted per HPI  Allergies  Allergen Reactions   Viibryd [Vilazodone Hcl]     Pt lost sense of taste     Past Medical History:  Diagnosis Date   Alcohol abuse    Allergy    seasonal but not treated    Anxiety    Depression    Enlarged prostate    Gout    History of hematuria    Hyperlipidemia    Nocturia    Right adrenal mass [facility])     Past Surgical History:  Procedure Laterality Date   EYE SURGERY  12-13 yrs ago   lasix eye surgery   INCISIONAL HERNIA REPAIR N/A 02/02/2016   Procedure: LAPAROSCOPIC INCISIONAL HERNIA;  Surgeon: Avel Peace, MD;  Location: [facility];  Service: General;  Laterality: N/A;   INSERTION OF MESH N/A 02/02/2016   Procedure: INSERTION OF MESH;  Surgeon: Avel Peace, MD;  Location: [facility];  Service: General;  Laterality: N/A;   KNEE SURGERY Left 1989   arthroscopy   nerve cautery     jaw bone    ROBOTIC ADRENALECTOMY Right 08/05/2015   Procedure: XI ROBOTIC RIGHT ADRENALECTOMY ;  Surgeon: Sebastian Ache, MD;  Location: [facility];  Service: Urology;  Laterality: Right;    Social History   Socioeconomic History   Marital status:  Single    Spouse name: Not on file   Number of children: Not on file   Years of education: Not on file   Highest education level: Not on file  Occupational History   Not on file  Tobacco Use   Smoking status: Never   Smokeless tobacco: Never  Substance and Sexual Activity   Alcohol use: Never    Alcohol/week: 75.0 standard drinks of alcohol    Types: 60 Cans of beer, 15 Shots of liquor per week    Comment: 5 years alcohol free   Drug use: Yes    Types: Marijuana    Comment: couple of times a month   Sexual activity: Yes    Partners: Female  Other Topics Concern   Not on file  Social History Narrative   Not on file   Social Determinants of Health   Financial Resource Strain: Not on file  Food Insecurity: Not on file  Transportation Needs: Not on file  Physical Activity: Not on file  Stress: Not on file  Social Connections: Unknown (10/30/2021)   Received from [facility]   Social Network    Social Network: Not on file    Family History  Problem Relation Age of Onset  Hyperlipidemia Father    Heart failure Father    Alcohol abuse Maternal Grandfather    Colon cancer Neg Hx    Stomach cancer Neg Hx    Rectal cancer Neg Hx    Colon polyps Neg Hx    Esophageal cancer Neg Hx     Health Maintenance  Topic Date Due   Colonoscopy  02/03/2023 (Originally 04/16/2020)   COVID-19 Vaccine (3 - 2023-24 season) 02/08/2023 (Originally 02/18/2022)   INFLUENZA VACCINE  09/18/2023 (Originally 01/19/2023)   DTaP/Tdap/Td (2 - Td or Tdap) 01/05/2025   Hepatitis C Screening  Completed   HIV Screening  Completed   Zoster Vaccines- Shingrix  Completed   HPV VACCINES  Aged Out     ----------------------------------------------------------------------------------------------------------------------------------------------------------------------------------------------------------------- Physical Exam BP 111/70 (BP Location: Left Arm, Patient Position: Sitting, Cuff Size: Normal)    Pulse 62   Ht 5\' 8"  (1.727 m)   Wt 195 lb (88.5 kg)   SpO2 96%   BMI 29.65 kg/m   Physical Exam Constitutional:      Appearance: Normal appearance.  Eyes:     General: No scleral icterus. Cardiovascular:     Rate and Rhythm: Normal rate and regular rhythm.  Pulmonary:     Effort: Pulmonary effort is normal.     Breath sounds: Normal breath sounds.  Musculoskeletal:     Cervical back: Neck supple.  Neurological:     Mental Status: He is alert.  Psychiatric:        Mood and Affect: Mood normal.        Behavior: Behavior normal.     ------------------------------------------------------------------------------------------------------------------------------------------------------------------------------------------------------------------- Assessment and Plan  Hepatic cirrhosis (HCC) He is followed by gastroenterology.  Stable at this time.  He had ultrasound in January of this year for surveillance to be sure he is not developing hepatocellular carcinoma.    Alcohol abuse Prior history of alcohol abuse, has been able to remain abstinent for several years.  Anxiety state He did see a therapist for a while which was helpful.  Continues on Lexapro with BuSpar.  Stable at this time.  Hyperlipidemia With atorvastatin.  Will plan to continue this at current strength.  Insomnia Remains on Ambien, will continue this at current strength.   Meds ordered this encounter  Medications   allopurinol (ZYLOPRIM) 300 MG tablet    Sig: Take 1 tablet (300 mg total) by mouth daily.    Dispense:  90 tablet    Refill:  3   atorvastatin (LIPITOR) 10 MG tablet    Sig: TAKE 1 TABLET(10 MG) BY MOUTH DAILY    Dispense:  90 tablet    Refill:  3   busPIRone (BUSPAR) 15 MG tablet    Sig: TAKE 1 TABLET(15 MG) BY MOUTH TWICE DAILY.    Dispense:  60 tablet    Refill:  0   escitalopram (LEXAPRO) 20 MG tablet    Sig: Take 1 tablet (20 mg total) by mouth daily.    Dispense:  90 tablet     Refill:  3   zolpidem (AMBIEN) 5 MG tablet    Sig: Take 0.5-1 tablets (2.5-5 mg total) by mouth at bedtime as needed. for sleep    Dispense:  30 tablet    Refill:  1    Return in about 6 months (around 07/26/2023) for Mood.    This visit occurred during the SARS-CoV-2 public health emergency.  Safety protocols were in place, including screening questions prior to the visit, additional usage of staff PPE, and extensive  cleaning of exam room while observing appropriate contact time as indicated for disinfecting solutions.

## 2023-01-29 NOTE — Assessment & Plan Note (Signed)
Remains on Ambien, will continue this at current strength.

## 2023-01-29 NOTE — Assessment & Plan Note (Signed)
He is followed by gastroenterology.  Stable at this time.  He had ultrasound in January of this year for surveillance to be sure he is not developing hepatocellular carcinoma.

## 2023-01-29 NOTE — Assessment & Plan Note (Signed)
Prior history of alcohol abuse, has been able to remain abstinent for several years.

## 2023-01-29 NOTE — Assessment & Plan Note (Signed)
He did see a therapist for a while which was helpful.  Continues on Lexapro with BuSpar.  Stable at this time.

## 2023-01-29 NOTE — Assessment & Plan Note (Signed)
With atorvastatin.  Will plan to continue this at current strength.

## 2023-02-24 ENCOUNTER — Other Ambulatory Visit: Payer: Self-pay | Admitting: Family Medicine

## 2023-02-24 DIAGNOSIS — F411 Generalized anxiety disorder: Secondary | ICD-10-CM

## 2023-02-25 NOTE — Progress Notes (Signed)
COVID Vaccine received:  []  No [x]  Yes Date of any COVID positive Test in last 90 days:  PCP - Everrett Coombe, Do  Cardiologist -   Chest x-ray - 2005 EKG - (07-28-2015)  will repeat at PST  Stress Test -  ECHO -  Cardiac Cath -   PCR screen: []  Ordered & Completed           []   No Order but Needs PROFEND           [x]   N/A for this surgery  Surgery Plan:  [x]  Ambulatory   []  Outpatient in bed  []  Admit  Anesthesia:    []  General  []  Spinal  [x]   Choice []   MAC  Bowel Prep - []  No  []   Yes ______?????  Pacemaker / ICD device [x]  No []  Yes   Spinal Cord Stimulator:[x]  No []  Yes       History of Sleep Apnea? [x]  No []  Yes   CPAP used?- [x]  No []  Yes    Does the patient monitor blood sugar?  []  No []  Yes  [x]  N/A  Patient has: [x]  NO Hx DM   []  Pre-DM   []  DM1  []   DM2 Last A1c was: normal 5.4  on  02-16-2021     Blood Thinner / Instructions:  none Aspirin Instructions: none  ERAS Protocol Ordered: [x]  No  []  Yes Patient is to be NPO after: midnight prior  Comments:   Activity level: Patient is able / unable to climb a flight of stairs without difficulty; []  No CP  []  No SOB, but would have ___   Patient can / can not perform ADLs without assistance.   Anesthesia review: MDD, anxiety, Substance abuse, cirrhosis, Thrombocytopenia, HTN, gout  Patient denies shortness of breath, fever, cough and chest pain at PAT appointment.  Patient verbalized understanding and agreement to the Pre-Surgical Instructions that were given to them at this PAT appointment. Patient was also educated of the need to review these PAT instructions again prior to his surgery.I reviewed the appropriate phone numbers to call if they have any and questions or concerns.

## 2023-02-25 NOTE — Patient Instructions (Signed)
SURGICAL WAITING ROOM VISITATION Patients having surgery or a procedure may have no more than 2 support people in the waiting area - these visitors may rotate in the visitor waiting room.   Due to an increase in RSV and influenza rates and associated hospitalizations, children ages 34 and under may not visit patients in [facility] hospitals. If the patient needs to stay at the hospital during part of their recovery, the visitor guidelines for inpatient rooms apply.  PRE-OP VISITATION  Pre-op nurse will coordinate an appropriate time for 1 support person to accompany the patient in pre-op.  This support person may not rotate.  This visitor will be contacted when the time is appropriate for the visitor to come back in the pre-op area.  Please refer to the [facility] website for the visitor guidelines for Inpatients (after your surgery is over and you are in a regular room).  You are not required to quarantine at this time prior to your surgery. However, you must do this: Hand Hygiene often Do NOT share personal items Notify your provider if you are in close contact with someone who has COVID or you develop fever 100.4 or greater, new onset of sneezing, cough, sore throat, shortness of breath or body aches.  If you test positive for Covid or have been in contact with anyone that has tested positive in the last 10 days please notify you surgeon.    Your procedure is scheduled on:  Wednesday  March 15, 2023  Report to [facility] Main Entrance: [place] entrance where the [employer] is available.   Report to admitting at: 06:15    AM  Call this number if you have any questions or problems the morning of surgery [phone]  DO NOT EAT OR DRINK ANYTHING AFTER MIDNIGHT THE NIGHT PRIOR TO YOUR SURGERY / PROCEDURE.   FOLLOW BOWEL PREP AND ANY ADDITIONAL PRE OP INSTRUCTIONS YOU RECEIVED FROM YOUR SURGEON'S OFFICE!!!   Oral Hygiene is also important to reduce your risk of  infection.        Remember - BRUSH YOUR TEETH THE MORNING OF SURGERY WITH YOUR REGULAR TOOTHPASTE  Do NOT smoke after Midnight the night before surgery.  STOP TAKING all Vitamins, Herbs and supplements 1 week before your surgery.   Take ONLY these medicines the morning of surgery with A SIP OF WATER: escitalopram (Lexapro), Buspirone (Buspar),  Allopurinol.                     You may not have any metal on your body including jewelry, and body piercing  Do not wear lotions, powders, cologne, or deodorant  Men may shave face and neck.  Contacts, Hearing Aids, dentures or bridgework may not be worn into surgery. DENTURES WILL BE REMOVED PRIOR TO SURGERY PLEASE DO NOT APPLY "Poly grip" OR ADHESIVES!!!   Patients discharged on the day of surgery will not be allowed to drive home.  Someone NEEDS to stay with you for the first 24 hours after anesthesia.  Do not bring your home medications to the hospital. The Pharmacy will dispense medications listed on your medication list to you during your admission in the Hospital.   Please read over the following fact sheets you were given: IF YOU HAVE QUESTIONS ABOUT YOUR PRE-OP INSTRUCTIONS, PLEASE CALL [phone].   [facility] - Preparing for Surgery Before surgery, you can play an important role.  Because skin is not sterile, your skin needs to be as free  of germs as possible.  You can reduce the number of germs on your skin by washing with CHG (chlorahexidine gluconate) soap before surgery.  CHG is an antiseptic cleaner which kills germs and bonds with the skin to continue killing germs even after washing. Please DO NOT use if you have an allergy to CHG or antibacterial soaps.  If your skin becomes reddened/irritated stop using the CHG and inform your nurse when you arrive at Short Stay. Do not shave (including legs and underarms) for at least 48 hours prior to the first CHG shower.  You may shave your face/neck.  Please follow these  instructions carefully:  1.  Shower with CHG Soap the night before surgery and the  morning of surgery.  2.  If you choose to wash your hair, wash your hair first as usual with your normal  shampoo.  3.  After you shampoo, rinse your hair and body thoroughly to remove the shampoo.                             4.  Use CHG as you would any other liquid soap.  You can apply chg directly to the skin and wash.  Gently with a scrungie or clean washcloth.  5.  Apply the CHG Soap to your body ONLY FROM THE NECK DOWN.   Do not use on face/ open                           Wound or open sores. Avoid contact with eyes, ears mouth and genitals (private parts).                       Wash face,  Genitals (private parts) with your normal soap.             6.  Wash thoroughly, paying special attention to the area where your  surgery  will be performed.  7.  Thoroughly rinse your body with warm water from the neck down.  8.  DO NOT shower/wash with your normal soap after using and rinsing off the CHG Soap.            9.  Pat yourself dry with a clean towel.            10.  Wear clean pajamas.            11.  Place clean sheets on your bed the night of your first shower and do not  sleep with pets.  ON THE DAY OF SURGERY : Do not apply any lotions/deodorants the morning of surgery.  Please wear clean clothes to the hospital/surgery center.    FAILURE TO FOLLOW THESE INSTRUCTIONS MAY RESULT IN THE CANCELLATION OF YOUR SURGERY  PATIENT SIGNATURE_________________________________  NURSE SIGNATURE__________________________________  ________________________________________________________________________

## 2023-02-27 ENCOUNTER — Encounter (HOSPITAL_COMMUNITY)
Admission: RE | Admit: 2023-02-27 | Discharge: 2023-02-27 | Disposition: A | Discharge status: Home / Self Care | Payer: 59 | Source: Ambulatory Visit | Attending: Urology | Admitting: Urology

## 2023-02-27 ENCOUNTER — Other Ambulatory Visit: Payer: Self-pay

## 2023-02-27 ENCOUNTER — Encounter (HOSPITAL_COMMUNITY): Payer: Self-pay

## 2023-02-27 VITALS — BP 106/72 | HR 71 | Temp 98.0°F | Resp 18 | Ht 67.0 in | Wt 193.0 lb

## 2023-02-27 DIAGNOSIS — Z01812 Encounter for preprocedural laboratory examination: Secondary | ICD-10-CM | POA: Insufficient documentation

## 2023-02-27 DIAGNOSIS — K7030 Alcoholic cirrhosis of liver without ascites: Secondary | ICD-10-CM | POA: Diagnosis not present

## 2023-02-27 DIAGNOSIS — Z01818 Encounter for other preprocedural examination: Secondary | ICD-10-CM

## 2023-02-27 DIAGNOSIS — I10 Essential (primary) hypertension: Secondary | ICD-10-CM | POA: Diagnosis not present

## 2023-02-27 DIAGNOSIS — Z01810 Encounter for preprocedural cardiovascular examination: Secondary | ICD-10-CM | POA: Diagnosis not present

## 2023-02-27 HISTORY — DX: Disease of blood and blood-forming organs, unspecified: D75.9

## 2023-02-27 HISTORY — DX: Essential (primary) hypertension: I10

## 2023-02-27 HISTORY — DX: Unspecified cirrhosis of liver: K74.60

## 2023-02-27 LAB — COMPREHENSIVE METABOLIC PANEL
ALT: 21 U/L (ref 0–44)
AST: 25 U/L (ref 15–41)
Albumin: 4.2 g/dL (ref 3.5–5.0)
Alkaline Phosphatase: 62 U/L (ref 38–126)
Anion gap: 10 (ref 5–15)
BUN: 7 mg/dL (ref 6–20)
CO2: 25 mmol/L (ref 22–32)
Calcium: 8.8 mg/dL — ABNORMAL LOW (ref 8.9–10.3)
Chloride: 104 mmol/L (ref 98–111)
Creatinine, Ser: 0.63 mg/dL (ref 0.61–1.24)
GFR, Estimated: >60 mL/min (ref >60)
Glucose, Bld: 80 mg/dL (ref 70–99)
Potassium: 3.9 mmol/L (ref 3.5–5.1)
Sodium: 139 mmol/L (ref 135–145)
Total Bilirubin: 0.6 mg/dL (ref 0.3–1.2)
Total Protein: 7.4 g/dL (ref 6.5–8.1)

## 2023-02-27 LAB — CBC
HCT: 43.1 % (ref 39.0–52.0)
Hemoglobin: 13.9 g/dL (ref 13.0–17.0)
MCH: 31 pg (ref 26.0–34.0)
MCHC: 32.3 g/dL (ref 30.0–36.0)
MCV: 96.2 fL (ref 80.0–100.0)
Platelets: 177 10*3/uL (ref 150–400)
RBC: 4.48 MIL/uL (ref 4.22–5.81)
RDW: 13.4 % (ref 11.5–15.5)
WBC: 4.8 10*3/uL (ref 4.0–10.5)
nRBC: 0 % (ref 0.0–0.2)

## 2023-03-15 ENCOUNTER — Encounter (HOSPITAL_COMMUNITY)
Admission: RE | Disposition: A | Discharge status: Home / Self Care | Payer: Self-pay | Source: Ambulatory Visit | Attending: Urology

## 2023-03-15 ENCOUNTER — Ambulatory Visit (HOSPITAL_COMMUNITY): Payer: 59 | Admitting: Anesthesiology

## 2023-03-15 ENCOUNTER — Other Ambulatory Visit: Payer: Self-pay

## 2023-03-15 ENCOUNTER — Ambulatory Visit (HOSPITAL_BASED_OUTPATIENT_CLINIC_OR_DEPARTMENT_OTHER): Payer: 59 | Admitting: Anesthesiology

## 2023-03-15 ENCOUNTER — Other Ambulatory Visit (HOSPITAL_COMMUNITY): Payer: Self-pay

## 2023-03-15 ENCOUNTER — Encounter (HOSPITAL_COMMUNITY): Payer: Self-pay | Admitting: Urology

## 2023-03-15 ENCOUNTER — Ambulatory Visit (HOSPITAL_COMMUNITY)
Admission: RE | Admit: 2023-03-15 | Discharge: 2023-03-15 | Disposition: A | Discharge status: Home / Self Care | Payer: 59 | Source: Ambulatory Visit | Attending: Urology | Admitting: Urology

## 2023-03-15 DIAGNOSIS — K7460 Unspecified cirrhosis of liver: Secondary | ICD-10-CM | POA: Insufficient documentation

## 2023-03-15 DIAGNOSIS — F418 Other specified anxiety disorders: Secondary | ICD-10-CM | POA: Diagnosis not present

## 2023-03-15 DIAGNOSIS — A5131 Condyloma latum: Secondary | ICD-10-CM | POA: Diagnosis not present

## 2023-03-15 DIAGNOSIS — A630 Anogenital (venereal) warts: Secondary | ICD-10-CM | POA: Insufficient documentation

## 2023-03-15 DIAGNOSIS — F32A Depression, unspecified: Secondary | ICD-10-CM | POA: Insufficient documentation

## 2023-03-15 DIAGNOSIS — I10 Essential (primary) hypertension: Secondary | ICD-10-CM | POA: Insufficient documentation

## 2023-03-15 DIAGNOSIS — F419 Anxiety disorder, unspecified: Secondary | ICD-10-CM | POA: Insufficient documentation

## 2023-03-15 HISTORY — PX: CONDYLOMA EXCISION/FULGURATION: SHX1389

## 2023-03-15 HISTORY — PX: LASER ABLATION CONDOLAMATA: SHX5941

## 2023-03-15 SURGERY — REMOVAL, CONDYLOMA
Anesthesia: General

## 2023-03-15 MED ORDER — ACETIC ACID 5 % SOLN
Status: AC
  Filled 2023-03-15: qty 50

## 2023-03-15 MED ORDER — GLYCOPYRROLATE 0.2 MG/ML IJ SOLN
INTRAMUSCULAR | Status: AC
  Filled 2023-03-15: qty 1

## 2023-03-15 MED ORDER — DEXMEDETOMIDINE HCL IN NACL 80 MCG/20ML IV SOLN
INTRAVENOUS | Status: DC | PRN
Start: 2023-03-15 — End: 2023-03-15
  Administered 2023-03-15: 8 ug via INTRAVENOUS

## 2023-03-15 MED ORDER — LIDOCAINE HCL (CARDIAC) PF 100 MG/5ML IV SOSY
PREFILLED_SYRINGE | INTRAVENOUS | Status: DC | PRN
  Administered 2023-03-15: 60 mg via INTRAVENOUS

## 2023-03-15 MED ORDER — SILVER SULFADIAZINE 1 % EX CREA
1.0000 | TOPICAL_CREAM | Freq: Every day | CUTANEOUS | Status: AC
  Administered 2023-03-15: 1 via TOPICAL
  Filled 2023-03-15: qty 50

## 2023-03-15 MED ORDER — CELECOXIB 200 MG PO CAPS
200.0000 mg | ORAL_CAPSULE | ORAL | Status: AC
  Administered 2023-03-15: 200 mg via ORAL
  Filled 2023-03-15: qty 1

## 2023-03-15 MED ORDER — CLINDAMYCIN PHOSPHATE 900 MG/50ML IV SOLN
900.0000 mg | INTRAVENOUS | Status: AC
  Administered 2023-03-15: 900 mg via INTRAVENOUS
  Filled 2023-03-15: qty 50

## 2023-03-15 MED ORDER — MIDAZOLAM HCL 5 MG/5ML IJ SOLN
INTRAMUSCULAR | Status: DC | PRN
  Administered 2023-03-15: 2 mg via INTRAVENOUS

## 2023-03-15 MED ORDER — CHLORHEXIDINE GLUCONATE 0.12 % MT SOLN
15.0000 mL | Freq: Once | OROMUCOSAL | Status: AC
  Administered 2023-03-15: 15 mL via OROMUCOSAL

## 2023-03-15 MED ORDER — BUPIVACAINE LIPOSOME 1.3 % IJ SUSP
INTRAMUSCULAR | Status: AC
  Filled 2023-03-15: qty 20

## 2023-03-15 MED ORDER — ACETIC ACID 5 % SOLN
Status: DC | PRN
  Administered 2023-03-15: 1 via TOPICAL

## 2023-03-15 MED ORDER — SILVER SULFADIAZINE 1 % EX CREA
TOPICAL_CREAM | CUTANEOUS | 1 refills | Status: AC
  Filled 2023-03-15: qty 50, 7d supply, fill #0
  Filled 2023-03-15: qty 50, 30d supply, fill #0

## 2023-03-15 MED ORDER — EPHEDRINE SULFATE (PRESSORS) 50 MG/ML IJ SOLN
INTRAMUSCULAR | Status: DC | PRN
Start: 2023-03-15 — End: 2023-03-15
  Administered 2023-03-15 (×3): 5 mg via INTRAVENOUS

## 2023-03-15 MED ORDER — FENTANYL CITRATE PF 50 MCG/ML IJ SOSY: 25.0000 ug | PREFILLED_SYRINGE | INTRAMUSCULAR | Status: DC | PRN

## 2023-03-15 MED ORDER — FENTANYL CITRATE (PF) 100 MCG/2ML IJ SOLN
INTRAMUSCULAR | Status: DC | PRN
  Administered 2023-03-15: 50 ug via INTRAVENOUS
  Administered 2023-03-15: 100 ug via INTRAVENOUS
  Administered 2023-03-15: 50 ug via INTRAVENOUS
  Administered 2023-03-15 (×2): 25 ug via INTRAVENOUS
  Administered 2023-03-15: 50 ug via INTRAVENOUS
  Administered 2023-03-15 (×2): 25 ug via INTRAVENOUS

## 2023-03-15 MED ORDER — BUPIVACAINE LIPOSOME 1.3 % IJ SUSP: 20.0000 mL | Freq: Once | INTRAMUSCULAR | Status: DC

## 2023-03-15 MED ORDER — STERILE WATER FOR IRRIGATION IR SOLN
Status: DC | PRN
Start: 2023-03-15 — End: 2023-03-15
  Administered 2023-03-15: 1000 mL

## 2023-03-15 MED ORDER — LACTATED RINGERS IV SOLN: INTRAVENOUS | Status: DC

## 2023-03-15 MED ORDER — BUPIVACAINE HCL 0.25 % IJ SOLN
INTRAMUSCULAR | Status: DC | PRN
Start: 2023-03-15 — End: 2023-03-15
  Administered 2023-03-15: 20 mL

## 2023-03-15 MED ORDER — BUPIVACAINE HCL 0.25 % IJ SOLN
INTRAMUSCULAR | Status: AC
  Filled 2023-03-15: qty 1

## 2023-03-15 MED ORDER — GLYCOPYRROLATE 0.2 MG/ML IJ SOLN
INTRAMUSCULAR | Status: DC | PRN
  Administered 2023-03-15 (×2): .1 mg via INTRAVENOUS

## 2023-03-15 MED ORDER — MIDAZOLAM HCL 2 MG/2ML IJ SOLN
INTRAMUSCULAR | Status: AC
  Filled 2023-03-15: qty 2

## 2023-03-15 MED ORDER — PROPOFOL 10 MG/ML IV BOLUS
INTRAVENOUS | Status: DC | PRN
  Administered 2023-03-15: 200 mg via INTRAVENOUS

## 2023-03-15 MED ORDER — FENTANYL CITRATE (PF) 100 MCG/2ML IJ SOLN
INTRAMUSCULAR | Status: AC
  Filled 2023-03-15: qty 2

## 2023-03-15 MED ORDER — DEXAMETHASONE SODIUM PHOSPHATE 10 MG/ML IJ SOLN
INTRAMUSCULAR | Status: AC
  Filled 2023-03-15: qty 1

## 2023-03-15 MED ORDER — ORAL CARE MOUTH RINSE: 15.0000 mL | Freq: Once | OROMUCOSAL | Status: AC

## 2023-03-15 MED ORDER — EPHEDRINE 5 MG/ML INJ
INTRAVENOUS | Status: AC
  Filled 2023-03-15: qty 5

## 2023-03-15 MED ORDER — FENTANYL CITRATE (PF) 250 MCG/5ML IJ SOLN
INTRAMUSCULAR | Status: AC
  Filled 2023-03-15: qty 5

## 2023-03-15 MED ORDER — OXYCODONE-ACETAMINOPHEN 5-325 MG PO TABS
1.0000 | ORAL_TABLET | Freq: Four times a day (QID) | ORAL | 0 refills | Status: DC | PRN
Start: 2023-03-15 — End: 2024-02-06
  Filled 2023-03-15 (×2): qty 15, 4d supply, fill #0

## 2023-03-15 MED ORDER — LIDOCAINE HCL (PF) 2 % IJ SOLN
INTRAMUSCULAR | Status: AC
  Filled 2023-03-15: qty 5

## 2023-03-15 MED ORDER — PROPOFOL 10 MG/ML IV BOLUS
INTRAVENOUS | Status: AC
  Filled 2023-03-15: qty 20

## 2023-03-15 MED ORDER — SENNOSIDES-DOCUSATE SODIUM 8.6-50 MG PO TABS
1.0000 | ORAL_TABLET | Freq: Two times a day (BID) | ORAL | 0 refills | Status: AC
  Filled 2023-03-15 (×2): qty 10, 5d supply, fill #0

## 2023-03-15 MED ORDER — ONDANSETRON HCL 4 MG/2ML IJ SOLN
INTRAMUSCULAR | Status: DC | PRN
  Administered 2023-03-15: 4 mg via INTRAVENOUS

## 2023-03-15 MED ORDER — PHENYLEPHRINE 80 MCG/ML (10ML) SYRINGE FOR IV PUSH (FOR BLOOD PRESSURE SUPPORT)
PREFILLED_SYRINGE | INTRAVENOUS | Status: DC | PRN
  Administered 2023-03-15: 80 ug via INTRAVENOUS

## 2023-03-15 MED ORDER — DEXMEDETOMIDINE HCL IN NACL 80 MCG/20ML IV SOLN
INTRAVENOUS | Status: AC
  Filled 2023-03-15: qty 20

## 2023-03-15 MED ORDER — BUPIVACAINE LIPOSOME 1.3 % IJ SUSP
INTRAMUSCULAR | Status: DC | PRN
Start: 2023-03-15 — End: 2023-03-15
  Administered 2023-03-15: 20 mL

## 2023-03-15 MED ORDER — DEXAMETHASONE SODIUM PHOSPHATE 4 MG/ML IJ SOLN
INTRAMUSCULAR | Status: DC | PRN
  Administered 2023-03-15: 8 mg via INTRAVENOUS

## 2023-03-15 MED ORDER — ACETAMINOPHEN 500 MG PO TABS
1000.0000 mg | ORAL_TABLET | ORAL | Status: AC
  Administered 2023-03-15: 1000 mg via ORAL
  Filled 2023-03-15: qty 2

## 2023-03-15 MED ORDER — GABAPENTIN 300 MG PO CAPS
300.0000 mg | ORAL_CAPSULE | ORAL | Status: AC
  Administered 2023-03-15: 300 mg via ORAL
  Filled 2023-03-15: qty 1

## 2023-03-15 MED ORDER — ONDANSETRON HCL 4 MG/2ML IJ SOLN
INTRAMUSCULAR | Status: AC
  Filled 2023-03-15: qty 2

## 2023-03-15 SURGICAL SUPPLY — 52 items
BAG COUNTER SPONGE SURGICOUNT (BAG) IMPLANT
BAG SPNG CNTER NS LX DISP (BAG)
BLADE HEX COATED 2.75 (ELECTRODE) ×1 IMPLANT
BLADE SURG 15 STRL LF DISP TIS (BLADE) ×1 IMPLANT
BLADE SURG 15 STRL SS (BLADE)
BRIEF MESH DISP LRG (UNDERPADS AND DIAPERS) ×1 IMPLANT
CANISTER SUCT 3000ML PPV (MISCELLANEOUS) ×1 IMPLANT
COVER BACK TABLE 60X90IN (DRAPES) ×1 IMPLANT
COVER MAYO STAND STRL (DRAPES) ×1 IMPLANT
COVER SURGICAL LIGHT HANDLE (MISCELLANEOUS) ×1 IMPLANT
DRAPE LAPAROTOMY T 98X78 PEDS (DRAPES) ×1 IMPLANT
DRAPE SHEET LG 3/4 BI-LAMINATE (DRAPES) IMPLANT
DRAPE UTILITY XL STRL (DRAPES) ×1 IMPLANT
DRSG VASELINE 3X18 (GAUZE/BANDAGES/DRESSINGS) IMPLANT
ELECT REM PT RETURN 15FT ADLT (MISCELLANEOUS) ×1 IMPLANT
GAUZE 4X4 16PLY ~~LOC~~+RFID DBL (SPONGE) IMPLANT
GAUZE PAD ABD 8X10 STRL (GAUZE/BANDAGES/DRESSINGS) ×1 IMPLANT
GAUZE SPONGE 4X4 12PLY STRL (GAUZE/BANDAGES/DRESSINGS) IMPLANT
GLOVE BIO SURGEON STRL SZ 6.5 (GLOVE) ×1 IMPLANT
GLOVE INDICATOR 6.5 STRL GRN (GLOVE) ×1 IMPLANT
GLOVE SURG LX STRL 7.5 STRW (GLOVE) ×1 IMPLANT
GOWN SRG 2XL LVL 4 BRTHBL STRL (GOWNS) ×1 IMPLANT
GOWN STRL NON-REIN 2XL LVL4 (GOWNS) ×1
GOWN STRL REUS W/ TWL LRG LVL3 (GOWN DISPOSABLE) ×1 IMPLANT
GOWN STRL REUS W/ TWL XL LVL3 (GOWN DISPOSABLE) ×2 IMPLANT
GOWN STRL REUS W/TWL LRG LVL3 (GOWN DISPOSABLE) ×1
GOWN STRL REUS W/TWL XL LVL3 (GOWN DISPOSABLE) ×2
JELLY LUBE HR FLIP 4OZ (MISCELLANEOUS) IMPLANT
KIT BASIN OR (CUSTOM PROCEDURE TRAY) ×1 IMPLANT
KIT TURNOVER KIT A (KITS) IMPLANT
MANIFOLD NEPTUNE II (INSTRUMENTS) IMPLANT
NDL HYPO 25X1 1.5 SAFETY (NEEDLE) ×1 IMPLANT
NDL SAFETY ECLIP 18X1.5 (MISCELLANEOUS) IMPLANT
NEEDLE HYPO 25X1 1.5 SAFETY (NEEDLE) ×1
PAD TELFA 2X3 NADH STRL (GAUZE/BANDAGES/DRESSINGS) IMPLANT
PENCIL SMOKE EVACUATOR (MISCELLANEOUS) IMPLANT
PROTECTOR NERVE ULNAR (MISCELLANEOUS) IMPLANT
SPIKE FLUID TRANSFER (MISCELLANEOUS) ×1 IMPLANT
SPONGE SURGIFOAM ABS GEL 12-7 (HEMOSTASIS) IMPLANT
SUT CHROMIC 2 0 SH (SUTURE) IMPLANT
SUT CHROMIC 3 0 SH 27 (SUTURE) IMPLANT
SUT MNCRL AB 4-0 PS2 18 (SUTURE) IMPLANT
SUT MON AB 3-0 SH 27 (SUTURE)
SUT MON AB 3-0 SH27 (SUTURE) IMPLANT
SUT VIC AB 4-0 P-3 18XBRD (SUTURE) IMPLANT
SUT VIC AB 4-0 P3 18 (SUTURE)
SYR CONTROL 10ML LL (SYRINGE) ×1 IMPLANT
TOWEL OR 17X26 10 PK STRL BLUE (TOWEL DISPOSABLE) ×3 IMPLANT
TUBING CONNECTING 10 (TUBING) ×1 IMPLANT
VACUUM HOSE 7/8X10 W/ WAND (MISCELLANEOUS) ×1 IMPLANT
WATER STERILE IRR 500ML POUR (IV SOLUTION) ×1 IMPLANT
YANKAUER SUCT BULB TIP NO VENT (SUCTIONS) ×1 IMPLANT

## 2023-03-15 NOTE — Transfer of Care (Signed)
Immediate Anesthesia Transfer of Care Note  Patient: [name] Brown  Procedure(s) Performed: GENITAL CONDYLOMA EXCISION LASER ABLATION ANAL CONDYLOMA  Patient Location: PACU  Anesthesia Type:General  Level of Consciousness: drowsy and patient cooperative  Airway & Oxygen Therapy: Patient Spontanous Breathing and Patient connected to face mask oxygen  Post-op Assessment: Report given to RN and Post -op Vital signs reviewed and stable  Post vital signs: Reviewed and stable  Last Vitals:  Vitals Value Taken Time  BP 125/91 03/15/23 1003  Temp    Pulse 73 03/15/23 1006  Resp 14 03/15/23 1006  SpO2 96 % 03/15/23 1006  Vitals shown include unfiled device data.  Last Pain:  Vitals:   03/15/23 0720  TempSrc:   PainSc: 0-No pain         Complications: No notable events documented.

## 2023-03-15 NOTE — Anesthesia Postprocedure Evaluation (Signed)
Anesthesia Post Note  Patient: [name] Brown  Procedure(s) Performed: GENITAL CONDYLOMA EXCISION LASER ABLATION ANAL CONDYLOMA     Patient location during evaluation: PACU Anesthesia Type: General Level of consciousness: awake and alert Pain management: pain level controlled Vital Signs Assessment: post-procedure vital signs reviewed and stable Respiratory status: spontaneous breathing, nonlabored ventilation, respiratory function stable and patient connected to nasal cannula oxygen Cardiovascular status: blood pressure returned to baseline and stable Postop Assessment: no apparent nausea or vomiting Anesthetic complications: no  No notable events documented.  Last Vitals:  Vitals:   03/15/23 1045 03/15/23 1100  BP: (!) 104/91 107/78  Pulse: 72 65  Resp: 16 17  Temp:  36.6 C  SpO2: 92% 95%    Last Pain:  Vitals:   03/15/23 1100  TempSrc:   PainSc: 0-No pain                 Kerry Odonohue L Kaeley Vinje

## 2023-03-15 NOTE — Anesthesia Procedure Notes (Signed)
Procedure Name: LMA Insertion Date/Time: 03/15/2023 8:46 AM  Performed by: Ahmed Prima, CRNAPre-anesthesia Checklist: Patient identified, Emergency Drugs available, Suction available and Patient being monitored Patient Re-evaluated:Patient Re-evaluated prior to induction Oxygen Delivery Method: Circle System Utilized Preoxygenation: Pre-oxygenation with 100% oxygen Induction Type: IV induction Ventilation: Mask ventilation without difficulty LMA: LMA inserted LMA Size: 4.0 Number of attempts: 1 Placement Confirmation: positive ETCO2 Tube secured with: Tape Dental Injury: Teeth and Oropharynx as per pre-operative assessment

## 2023-03-15 NOTE — Op Note (Signed)
[name], Brown MEDICAL RECORD NO: [id number] ACCOUNT NO: [id number] DATE OF BIRTH: 07/14/1965 FACILITY: [facility] LOCATION: [facility] PHYSICIAN: Sebastian Ache, MD  Operative Report   DATE OF PROCEDURE: 03/15/2023  PREOPERATIVE DIAGNOSIS:  Genital condyloma, recurrent.  PROCEDURE:  Excision of genital condyloma 4 cm2.  ESTIMATED BLOOD LOSS:  Nil.  COMPLICATIONS:  None.  SPECIMEN:  Genital condyloma skin for permanent pathology.  FINDINGS:   1.  3 Foci of genital condyloma. 2.  One small focus right lateral penoscrotal junction and another prepubic, another left ventral penoscrotal junction in total surface area approximately 4 cm2. 3.  Additional condyloma in the scrotal inguinal area.  These were ablated, separate operative note by Dr. Maisie Fus.  INDICATIONS:  The patient is a 57 year old man with history of recurrent condyloma.  He is compliant with topical therapies, which typically do a fair job of controlling his disease.  He unfortunately has had some progression and increasing bother from  condyloma on his penoscrotal junction as well as anal and scrotal area.  He presented for consideration of management options.  Given the multifocality I suggested team approach from our colorectal surgeons and myself, Dr. Maisie Fus evaluated the patient  and graciously agreed.  Informed consent was obtained and placed in medical record.  PROCEDURE DETAILS:  The patient being identified and verified, procedure being local excision of the condyloma was confirmed.  Procedure timeout was performed. Intravenous antibiotics were administered.  General LMA anesthesia induced. The patient placed  into medium lithotomy position.  Sterile field was created.  Prepped and draped the patient's penis, perineum, and proximal thighs as well as anal area using iodine.  Three separate areas of elliptical incisions were made encompassing the three  different foci of genital condyloma and the two dominant areas of  skin were set aside for permanent pathology.  Total resection area approximately 4 cm2.  This was reapproximated using interrupted subcuticular Monocryl, which resulted in excellent  hemostasis. After complete resolution of all genital portion of the condyloma at this point, the procedure was then turned over to Dr. Maisie Fus for the laser ablation of the remaining foci.  The patient tolerated procedure well, no immediate perioperative  complications.  The patient was taken to postanesthesia care unit in stable condition.  Plan for discharge home.   PUS D: 03/15/2023 9:53:10 am T: 03/15/2023 10:08:00 am  JOB: [id number]/ [id number]

## 2023-03-15 NOTE — H&P (Signed)
[name] Brown is an 57 y.o. male.    Chief Complaint: Pre-OP Multifocal Anogenital Condyloma Excision  HPI:   1 -  Genital Condyloma / Analy Condyuloma - pt with scrotal condyloma well treated with conduylox, but also about 1cm linear conglomerate of dorsal penile base condyloma that have been med refractory, s/p office excision 2019. Uses podocon now PRN for early outbreaks with good control. Some recurrenc and progression of dorsal shaft wards (abrou 3cm area + some anal area warts 2024 and interested in excision of all.    PMH sig for alcohol abuse (improving), Gout, HLD, knee arthroscopy, incisional hernia repair with mesh, cirrhosis (improved on meds). 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. His PCP is Sunnie Nielsen DO with [facility].   Today "[name] Brown" is seen to proceed with anogenital condyloma excision in combination with general surgery (Dr. Maisie Fus).    Past Medical History:  Diagnosis Date   Alcohol abuse    and marijuana   Allergy    seasonal but not treated    Anxiety    Blood dyscrasia    thrombocytopenia   Cirrhosis of liver (HCC)    Depression    Enlarged prostate    Gout    History of hematuria    Hyperlipidemia    Hypertension    Nocturia    Right adrenal mass [facility])     Past Surgical History:  Procedure Laterality Date   EYE SURGERY Right 12-13 yrs ago   lasix eye surgery   INCISIONAL HERNIA REPAIR N/A 02/02/2016   Procedure: LAPAROSCOPIC INCISIONAL HERNIA;  Surgeon: Avel Peace, MD;  Location: [facility];  Service: General;  Laterality: N/A;   INSERTION OF MESH N/A 02/02/2016   Procedure: INSERTION OF MESH;  Surgeon: Avel Peace, MD;  Location: [facility];  Service: General;  Laterality: N/A;   KNEE SURGERY Left 1989   arthroscopy   nerve cautery     jaw bone    ROBOTIC ADRENALECTOMY Right 08/05/2015   Procedure: XI ROBOTIC RIGHT ADRENALECTOMY ;  Surgeon: Sebastian Ache, MD;  Location: [facility];  Service:  Urology;  Laterality: Right;   WISDOM TOOTH EXTRACTION  1981    Family History  Problem Relation Age of Onset   Hyperlipidemia Father    Heart failure Father    Alcohol abuse Maternal Grandfather    Colon cancer Neg Hx    Stomach cancer Neg Hx    Rectal cancer Neg Hx    Colon polyps Neg Hx    Esophageal cancer Neg Hx    Social History:  reports that he has never smoked. He has never used smokeless tobacco. He reports current drug use. Drug: Marijuana. He reports that he does not drink alcohol.  Allergies:  Allergies  Allergen Reactions   Viibryd [Vilazodone Hcl] Other (See Comments)    Pt lost sense of taste     No medications prior to admission.    No results found for this or any previous visit (from the past 48 hour(s)). No results found.  Review of Systems  Constitutional:  Negative for chills and fever.  All other systems reviewed and are negative.   There were no vitals taken for this visit. Physical Exam Vitals reviewed.  HENT:     Head: Normocephalic.  Eyes:     Pupils: Pupils are equal, round, and reactive to light.  Cardiovascular:     Rate and Rhythm: Normal rate.  Pulmonary:     Effort: Pulmonary effort  is normal.  Abdominal:     General: Abdomen is flat.  Genitourinary:    Comments: Stable multifocal penile and scrotal condyloma Musculoskeletal:        General: Normal range of motion.     Cervical back: Normal range of motion.  Skin:    General: Skin is warm.  Neurological:     General: No focal deficit present.     Mental Status: He is alert.  Psychiatric:        Mood and Affect: Mood normal.      Assessment/Plan Proceed as planned with mutlfocal anogenital condyloma excsion. Risks, benefits, alternatives, expected peri-op course discussed previously and reiterated today.   Loletta Parish., MD 03/15/2023, 6:29 AM

## 2023-03-15 NOTE — Anesthesia Preprocedure Evaluation (Addendum)
Anesthesia Evaluation  Patient identified by MRN, date of birth, ID band Patient awake    Reviewed: Allergy & Precautions, NPO status , Patient's Chart, lab work & pertinent test results  Airway Mallampati: I  TM Distance: >3 FB Neck ROM: Full    Dental no notable dental hx. (+) Teeth Intact, Dental Advisory Given   Pulmonary neg pulmonary ROS   Pulmonary exam normal breath sounds clear to auscultation       Cardiovascular hypertension, Normal cardiovascular exam Rhythm:Regular Rate:Normal     Neuro/Psych  PSYCHIATRIC DISORDERS Anxiety Depression    negative neurological ROS     GI/Hepatic negative GI ROS,,,(+) Cirrhosis     substance abuse  alcohol use and marijuana use  Endo/Other  negative endocrine ROS    Renal/GU negative Renal ROS  negative genitourinary   Musculoskeletal negative musculoskeletal ROS (+)    Abdominal   Peds  Hematology negative hematology ROS (+)   Anesthesia Other Findings   Reproductive/Obstetrics                             Anesthesia Physical Anesthesia Plan  ASA: 3  Anesthesia Plan: General   Post-op Pain Management: Tylenol PO (pre-op)*   Induction: Intravenous  PONV Risk Score and Plan: 2 and Ondansetron, Dexamethasone and Midazolam  Airway Management Planned: Oral ETT  Additional Equipment:   Intra-op Plan:   Post-operative Plan: Extubation in OR  Informed Consent: I have reviewed the patients History and Physical, chart, labs and discussed the procedure including the risks, benefits and alternatives for the proposed anesthesia with the patient or authorized representative who has indicated his/her understanding and acceptance.     Dental advisory given  Plan Discussed with: CRNA  Anesthesia Plan Comments:        Anesthesia Quick Evaluation

## 2023-03-15 NOTE — Op Note (Signed)
03/15/2023  9:50 AM  PATIENT:  [name] Brown  57 y.o. male  Patient Care Team: Everrett Coombe, DO as PCP - General (Family Medicine)  PRE-OPERATIVE DIAGNOSIS:  ANOGENITAL CONDYLOMA  POST-OPERATIVE DIAGNOSIS:  ANOGENITAL CONDYLOMA  PROCEDURE:  GENITAL CONDYLOMA EXCISION LASER ABLATION PERIANAL CONDYLOMA   Surgeon(s): Berneice Heinrich, Delbert Phenix., MD Romie Levee, MD  ASSISTANT: none   ANESTHESIA:   local and MAC  EBL: 5ml  Total I/O In: 1050 [I.V.:1000; IV Piggyback:50] Out: -   SPECIMEN:  No Specimen  DISPOSITION OF SPECIMEN:  N/A  COUNTS:  YES  PLAN OF CARE: Discharge to home after PACU  PATIENT DISPOSITION:  PACU - hemodynamically stable.  INDICATION: 57 y.o. M with anogenital condyloma here for excision and ablation  OR FINDINGS: circumferential perianal condyloma with 3 small internal acetowhite lesions.  Scrotal and penal condyloma as well.  DESCRIPTION: The patient was identified in the preoperative holding area and taken to the OR where they were laid supine on the operating room table in lithotomy position. MAC anesthesia was smoothly induced.  The patient was then prepped and draped in the usual sterile fashion. A surgical timeout was performed indicating the correct patient, procedure, positioning and preoperative antibioitics. SCDs were noted to be in place and functioning prior to the operation. Dr Berneice Heinrich began with the excisional portion of the case.  This will be dictated separately.    After this was completed, a sponge was soaked in 5% acetic acid was placed over the perianal region. This was allowed to soak. The sponge was removed and the perianal region was evaluated.  The internal anal canal was evaluated via anoscopy with a Hill-Ferguson anoscope.  There were 3 small acetowhite lesions noted internally and many small and moderate sized lesions externally.  After this was completed, hemostasis was achieved with electrocautery.  Next the laser was brought  onto the field.  The edges of the operative field were draped with wet towels.  Appropriate ventilation was obtained.  All staff were protected with small particle masks and goggles safe for the laser.  The laser was then activated. All condylomatous lesions were ablated. Hemostasis was then achieved using electrocautery. The area was then infused subcutaneously with a Marcaine and Experel solution.  A thin layer of Silvadene was then placed over the lesions. A sterile dressing was applied over this. The patient was then awakened from anesthesia and sent to the postanesthesia care unit in stable condition. All counts were correct operating room staff.   Vanita Panda, MD  Colorectal and General Surgery [facility]

## 2023-03-15 NOTE — Discharge Instructions (Addendum)
1 - Al stitches are dissolvable and will disappear over about 2-3 weeks. OK to shower starting tomorrow. NO active sexual stimulation x 2 weeks.   2 - Call MD or go to ER for fever >102, severe pain / nausea / vomiting not relieved by medications, or acute change in medical status    Post Operative Instructions Following Anal Laser Surgery  Laser treatment of condyloma (warts) is used to vaporize or eliminate the wart.  The laser actually creates a burn effect on the skin to accomplish this.  The following instructions will help in you comfort postoperatively:  First 24 h Remove the dressing this evening after you return home from the hospital Sit in a tub or sitz bath of COOL water for 20 minutes every hour while awake.  After the bath, carefully blot the area dry and place a piece of 100% Cotton with Silvadene on it next to the anal opening.  You may sit on a covered ice pack between the baths as needed.   You should have crushed ice in small amounts until you are able to urinate.  After you urinate, you may drink fluids.  It is normal to not urinate for the first few hours after surgery.  If you become uncomfortable, call the office for instructions.  Take your pain medications as prescribed You may eat whatever you feel like.  Start with a light meal and gradually advance your diet.    Beginning the day after your surgery Sit in a tub of cool to warm water for 15 minutes at least 3 times a day and after bowel movements After the bowel movement, clean with wet cotton, Tucks or baby wipes. Apply Silvadene to a thin piece of cotton and place over the anal opening after your baths.  This will collect any drainage or bleeding.  Drainage is usually a pink to tan color and is normal for the following 2-3 weeks after surgery.  Change to cotton ever 2-3 hours while awake.  Diet Eat a regular diet.  Avoid foods that may constipate you or give you diarrhea.  Avoid foods with seeds, nuts, corn or  popcorn. Beginning the day after surgery, drink 6-8 glasses of water a day in addition to your meals.  Limit you caffeine intake to 1-2 servings per day.  Medication Take a fiber supplement (Metamucil, Citrucel, FiberCon) twice a day. Take a stool softener like Colace twice daily Take your pain medication as directed.  You may use Extra Strength Tylenol instead of your prescribed pain medication to relieve mild discomfort.  Avoid products containing aspirin.  Bowel Habits You should have a bowel movement at least every other day.  If you are constipated, you may take a Fleet enema or 2 Dulcolax tablets.  Call the office if no results occur. You may bear down a normal amount to have a bowel movement without hurting your tissues after the operation.  Activity Walking is encouraged.  You may drive when you are no longer on prescription pain medication.  You may go up and down stairs carefully. No heavy lifting or strenuous activity until after your first post operative appointment Do NOT sit on a rubber ring; instead use a soft pillow if needed.  Call the office if you have any questions or concerns.  Call IMMEDIATELY if you develop: Rectal bleeding Increasing rectal pain Fever greater than 100 F Difficulty urinating

## 2023-03-15 NOTE — Brief Op Note (Signed)
03/15/2023  9:48 AM  PATIENT:  [name] Brown  57 y.o. male  PRE-OPERATIVE DIAGNOSIS:  GENITAL CONDYLOMA  POST-OPERATIVE DIAGNOSIS:  GENITAL CONDYLOMA  PROCEDURE:  Procedure(s) with comments: GENITAL CONDYLOMA EXCISION (N/A) - REQUESTS 90 MINS TOTAL FOR Jaliana Medellin 45 MINS FOR ALICIA THOMAS LASER ABLATION ANAL CONDYLOMA (N/A)  SURGEON:  Surgeons and Role: Panel 1:    * Shirline Kendle, Delbert Phenix., MD - Primary   PHYSICIAN ASSISTANT:   ASSISTANTS: none   ANESTHESIA:   local and general  EBL:  minimal   BLOOD ADMINISTERED:none  DRAINS: none   LOCAL MEDICATIONS USED:  MARCAINE     SPECIMEN:  Source of Specimen:  genital condyloma  DISPOSITION OF SPECIMEN:  PATHOLOGY  COUNTS:  YES  TOURNIQUET:  * No tourniquets in log *  DICTATION: .Other Dictation: Dictation Number   [id number]  PLAN OF CARE: Discharge to home after PACU  PATIENT DISPOSITION:  PACU - hemodynamically stable.   Delay start of Pharmacological VTE agent (>24hrs) due to surgical blood loss or risk of bleeding: not applicable

## 2023-03-16 ENCOUNTER — Encounter (HOSPITAL_COMMUNITY): Payer: Self-pay | Admitting: Urology

## 2023-03-16 ENCOUNTER — Other Ambulatory Visit (HOSPITAL_COMMUNITY): Payer: Self-pay

## 2023-03-16 ENCOUNTER — Other Ambulatory Visit: Payer: Self-pay

## 2023-03-16 LAB — SURGICAL PATHOLOGY

## 2023-06-28 ENCOUNTER — Other Ambulatory Visit: Payer: Self-pay | Admitting: Gastroenterology

## 2023-06-28 DIAGNOSIS — K7030 Alcoholic cirrhosis of liver without ascites: Secondary | ICD-10-CM

## 2023-06-30 ENCOUNTER — Ambulatory Visit
Admission: RE | Admit: 2023-06-30 | Discharge: 2023-06-30 | Disposition: A | Discharge status: Home / Self Care | Payer: 59 | Source: Ambulatory Visit | Attending: Gastroenterology | Admitting: Gastroenterology

## 2023-06-30 DIAGNOSIS — K7030 Alcoholic cirrhosis of liver without ascites: Secondary | ICD-10-CM

## 2023-07-24 ENCOUNTER — Telehealth: Payer: Self-pay

## 2023-07-24 NOTE — Telephone Encounter (Signed)
Copied from CRM [id number]. Topic: Clinical - Request for Lab/Test Order >> Jul 24, 2023  1:00 PM Shelah Lewandowsky wrote: Reason for CRM: Would like to get labs done before appt, please call [phone]

## 2023-07-25 ENCOUNTER — Other Ambulatory Visit: Payer: Self-pay

## 2023-07-25 DIAGNOSIS — E782 Mixed hyperlipidemia: Secondary | ICD-10-CM

## 2023-07-25 DIAGNOSIS — Z0000 Encounter for general adult medical examination without abnormal findings: Secondary | ICD-10-CM

## 2023-07-25 DIAGNOSIS — Z125 Encounter for screening for malignant neoplasm of prostate: Secondary | ICD-10-CM

## 2023-07-25 DIAGNOSIS — I10 Essential (primary) hypertension: Secondary | ICD-10-CM

## 2023-07-27 ENCOUNTER — Ambulatory Visit: Payer: 59 | Admitting: Family Medicine

## 2023-07-27 ENCOUNTER — Encounter: Payer: Self-pay | Admitting: Family Medicine

## 2023-07-27 VITALS — BP 101/66 | HR 57 | Ht 67.0 in | Wt 190.0 lb

## 2023-07-27 DIAGNOSIS — Z23 Encounter for immunization: Secondary | ICD-10-CM

## 2023-07-27 DIAGNOSIS — I8500 Esophageal varices without bleeding: Secondary | ICD-10-CM | POA: Insufficient documentation

## 2023-07-27 DIAGNOSIS — F411 Generalized anxiety disorder: Secondary | ICD-10-CM

## 2023-07-27 DIAGNOSIS — K7030 Alcoholic cirrhosis of liver without ascites: Secondary | ICD-10-CM

## 2023-07-27 LAB — CBC WITH DIFFERENTIAL/PLATELET
Basophils Absolute: 0 10*3/uL (ref 0.0–0.2)
Basos: 1 %
EOS (ABSOLUTE): 0.2 10*3/uL (ref 0.0–0.4)
Eos: 3 %
Hematocrit: 45.1 % (ref 37.5–51.0)
Hemoglobin: 15.5 g/dL (ref 13.0–17.7)
Immature Grans (Abs): 0.1 10*3/uL (ref 0.0–0.1)
Immature Granulocytes: 2 %
Lymphocytes Absolute: 2.3 10*3/uL (ref 0.7–3.1)
Lymphs: 42 %
MCH: 31.8 pg (ref 26.6–33.0)
MCHC: 34.4 g/dL (ref 31.5–35.7)
MCV: 93 fL (ref 79–97)
Monocytes Absolute: 0.5 10*3/uL (ref 0.1–0.9)
Monocytes: 9 %
Neutrophils Absolute: 2.4 10*3/uL (ref 1.4–7.0)
Neutrophils: 43 %
Platelets: 297 10*3/uL (ref 150–450)
RBC: 4.87 x10E6/uL (ref 4.14–5.80)
RDW: 12.7 % (ref 11.6–15.4)
WBC: 5.4 10*3/uL (ref 3.4–10.8)

## 2023-07-27 LAB — CMP14+EGFR
ALT: 24 [IU]/L (ref 0–44)
AST: 30 [IU]/L (ref 0–40)
Albumin: 4.4 g/dL (ref 3.8–4.9)
Alkaline Phosphatase: 82 [IU]/L (ref 44–121)
BUN/Creatinine Ratio: 15 (ref 9–20)
BUN: 12 mg/dL (ref 6–24)
Bilirubin Total: 0.3 mg/dL (ref 0.0–1.2)
CO2: 22 mmol/L (ref 20–29)
Calcium: 9.1 mg/dL (ref 8.7–10.2)
Chloride: 105 mmol/L (ref 96–106)
Creatinine, Ser: 0.8 mg/dL (ref 0.76–1.27)
Globulin, Total: 2.8 g/dL (ref 1.5–4.5)
Glucose: 94 mg/dL (ref 70–99)
Potassium: 4.6 mmol/L (ref 3.5–5.2)
Sodium: 141 mmol/L (ref 134–144)
Total Protein: 7.2 g/dL (ref 6.0–8.5)
eGFR: 103 mL/min/{1.73_m2} (ref >59)

## 2023-07-27 LAB — TSH: TSH: 0.545 u[IU]/mL (ref 0.450–4.500)

## 2023-07-27 LAB — LIPID PANEL
Chol/HDL Ratio: 4 {ratio} (ref 0.0–5.0)
Cholesterol, Total: 170 mg/dL (ref 100–199)
HDL: 42 mg/dL (ref >39)
LDL Chol Calc (NIH): 113 mg/dL — ABNORMAL HIGH (ref 0–99)
Triglycerides: 79 mg/dL (ref 0–149)
VLDL Cholesterol Cal: 15 mg/dL (ref 5–40)

## 2023-07-27 LAB — PSA: Prostate Specific Ag, Serum: 0.5 ng/mL (ref 0.0–4.0)

## 2023-07-27 MED ORDER — ATORVASTATIN CALCIUM 10 MG PO TABS: 10.0000 mg | ORAL_TABLET | Freq: Every evening | ORAL | 3 refills | Status: AC

## 2023-07-27 MED ORDER — BUSPIRONE HCL 15 MG PO TABS
ORAL_TABLET | ORAL | 6 refills | Status: DC
Start: 2023-07-27 — End: 2024-02-06

## 2023-07-27 NOTE — Progress Notes (Signed)
 [name] Brown - 57 y.o. male MRN [id number]  Date of birth: 12-Mar-1966  Subjective Chief Complaint  Patient presents with   Medical Management of Chronic Issues    HPI [name] Brown is a 57 y.o. male here today for follow up visit.   He reports that he is doing ok.  He continues on lexapro   with buspar  for management of depression and anxiety.  He feels that this is still working well for him.  He has not had significant side effects at current strength of medication.    He is followed by GI for history of alcoholic cirrhosis.  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  Has remained abstinent from EtOH for 5+ years.  Carvedilol added recently but he has not been taking consistent.   ROS:  A comprehensive ROS was completed and negative except as noted per HPI   No Active Allergies   Past Medical History:  Diagnosis Date   Alcohol  abuse    and marijuana   Allergy    seasonal but not treated    Anxiety    Blood dyscrasia    thrombocytopenia   Cirrhosis of liver (HCC)    Depression    Enlarged prostate    Gout    History of hematuria    Hyperlipidemia    Hypertension    Nocturia    Right adrenal mass [facility])     Past Surgical History:  Procedure Laterality Date   CONDYLOMA EXCISION/FULGURATION N/A 03/15/2023   Procedure: GENITAL CONDYLOMA EXCISION;  Surgeon: Alvaro Ricardo KATHEE Mickey., MD;  Location: [facility];  Service: Urology;  Laterality: N/A;  REQUESTS 90 MINS TOTAL FOR MANNY 45 MINS FOR ALICIA THOMAS   EYE SURGERY Right 12-13 yrs ago   lasix eye surgery   INCISIONAL HERNIA REPAIR N/A 02/02/2016   Procedure: LAPAROSCOPIC INCISIONAL HERNIA;  Surgeon: Krystal Russell, MD;  Location: [facility];  Service: General;  Laterality: N/A;   INSERTION OF MESH N/A 02/02/2016   Procedure: INSERTION OF MESH;  Surgeon: Krystal Russell, MD;  Location[facility];  Service: General;  Laterality: N/A;   KNEE SURGERY Left 1989   arthroscopy   LASER ABLATION CONDOLAMATA N/A  03/15/2023   Procedure: LASER ABLATION ANAL CONDYLOMA;  Surgeon: Debby Hila, MD;  Location: [facility];  Service: General;  Laterality: N/A;   nerve cautery     jaw bone    ROBOTIC ADRENALECTOMY Right 08/05/2015   Procedure: XI ROBOTIC RIGHT ADRENALECTOMY ;  Surgeon: Ricardo Alvaro, MD;  Location[facility];  Service: Urology;  Laterality: Right;   WISDOM TOOTH EXTRACTION  1981    Social History   Socioeconomic History   Marital status: Single    Spouse name: Not on file   Number of children: Not on file   Years of education: Not on file   Highest education level: Not on file  Occupational History   Not on file  Tobacco Use   Smoking status: Never   Smokeless tobacco: Never  Vaping Use   Vaping status: Never Used  Substance and Sexual Activity   Alcohol  use: Never    Alcohol /week: 75.0 standard drinks of alcohol     Types: 60 Cans of beer, 15 Shots of liquor per week    Comment: 5 years alcohol  free   Drug use: Yes    Types: Marijuana    Comment: couple of times a month   Sexual activity: Yes    Partners: Female  Other Topics Concern  Not on file  Social History Narrative   Not on file   Social Drivers of Health   Financial Resource Strain: Not on file  Food Insecurity: Not on file  Transportation Needs: Not on file  Physical Activity: Not on file  Stress: Not on file  Social Connections: Unknown (10/30/2021)   [facility]   Social Network    Social Network: Not on file    Family History  Problem Relation Age of Onset   Hyperlipidemia Father    Heart failure Father    Alcohol  abuse Maternal Grandfather    Colon cancer Neg Hx    Stomach cancer Neg Hx    Rectal cancer Neg Hx    Colon polyps Neg Hx    Esophageal cancer Neg Hx     Health Maintenance  Topic Date Due   COVID-19 Vaccine (3 - Pfizer risk series) 11/14/2019   Colonoscopy  04/16/2020   INFLUENZA VACCINE  09/18/2023 (Originally 01/19/2023)   DTaP/Tdap/Td (2 - Td or  Tdap) 01/05/2025   Pneumococcal Vaccine 59-36 Years old  Completed   Hepatitis C Screening  Completed   HIV Screening  Completed   Zoster Vaccines- Shingrix  Completed   HPV VACCINES  Aged Out     ----------------------------------------------------------------------------------------------------------------------------------------------------------------------------------------------------------------- Physical Exam BP 101/66 (BP Location: Left Arm, Patient Position: Sitting, Cuff Size: Normal)   Pulse (!) 57   Ht 5' 7 (1.702 m)   Wt 190 lb (86.2 kg)   SpO2 98%   BMI 29.76 kg/m   Physical Exam Constitutional:      Appearance: Normal appearance.  HENT:     Head: Normocephalic and atraumatic.  Eyes:     General: No scleral icterus. Cardiovascular:     Rate and Rhythm: Normal rate and regular rhythm.  Pulmonary:     Effort: Pulmonary effort is normal.     Breath sounds: Normal breath sounds.  Neurological:     Mental Status: He is alert.  Psychiatric:        Mood and Affect: Mood normal.        Behavior: Behavior normal.     ------------------------------------------------------------------------------------------------------------------------------------------------------------------------------------------------------------------- Assessment and Plan  Anxiety state He did see a therapist for a while which was helpful.  Continues on Lexapro  with BuSpar .  Stable at this time.  Alcoholic cirrhosis of liver without ascites [facility]) He is seeing gastroenterology. Has portal vein HTN with varices.  Carvedilol added but concerns about his BP being too low so he hasn't been taking.  He will discuss with GI at visit in March.    Meds ordered this encounter  Medications   atorvastatin  (LIPITOR) 10 MG tablet    Sig: Take 1 tablet (10 mg total) by mouth at bedtime.    Dispense:  90 tablet    Refill:  3   busPIRone  (BUSPAR ) 15 MG tablet    Sig: TAKE 1 TABLET(15 MG) BY MOUTH  TWICE DAILY.    Dispense:  60 tablet    Refill:  6    Return in about 6 months (around 01/24/2024) for Mood/BH.    This visit occurred during the SARS-CoV-2 public health emergency.  Safety protocols were in place, including screening questions prior to the visit, additional usage of staff PPE, and extensive cleaning of exam room while observing appropriate contact time as indicated for disinfecting solutions.

## 2023-07-27 NOTE — Assessment & Plan Note (Signed)
He did see a therapist for a while which was helpful.  Continues on Lexapro with BuSpar.  Stable at this time.

## 2023-07-27 NOTE — Assessment & Plan Note (Signed)
 He is seeing gastroenterology. Has portal vein HTN with varices.  Carvedilol added but concerns about his BP being too low so he hasn't been taking.  He will discuss with GI at visit in March.

## 2024-01-18 ENCOUNTER — Telehealth: Payer: Self-pay

## 2024-01-18 DIAGNOSIS — E782 Mixed hyperlipidemia: Secondary | ICD-10-CM

## 2024-01-18 NOTE — Telephone Encounter (Signed)
 Please contact the patient and advise the Lab orders have been placed. He can schedule labs with [facility]. Thx.   Copied from CRM [id number]. Topic: Clinical - Lab/Test Results >> Jan 18, 2024  3:54 PM Cherylann RAMAN wrote: Reason for CRM: Patient is requesting labs to be completed before his appt on 08/06 for Lipid Panel, UA, Cholesterol. He would like to have everything on a regular blood panel completed to ensure he is healthy.

## 2024-01-24 ENCOUNTER — Ambulatory Visit: Payer: 59 | Admitting: Family Medicine

## 2024-02-05 ENCOUNTER — Other Ambulatory Visit: Payer: Self-pay | Admitting: Family Medicine

## 2024-02-05 ENCOUNTER — Other Ambulatory Visit: Payer: Self-pay

## 2024-02-05 DIAGNOSIS — I10 Essential (primary) hypertension: Secondary | ICD-10-CM

## 2024-02-05 DIAGNOSIS — M109 Gout, unspecified: Secondary | ICD-10-CM

## 2024-02-05 DIAGNOSIS — Z125 Encounter for screening for malignant neoplasm of prostate: Secondary | ICD-10-CM

## 2024-02-05 DIAGNOSIS — E782 Mixed hyperlipidemia: Secondary | ICD-10-CM

## 2024-02-05 DIAGNOSIS — K7030 Alcoholic cirrhosis of liver without ascites: Secondary | ICD-10-CM

## 2024-02-05 NOTE — Progress Notes (Signed)
 [facility]

## 2024-02-06 ENCOUNTER — Ambulatory Visit: Admitting: Family Medicine

## 2024-02-06 ENCOUNTER — Encounter: Payer: Self-pay | Admitting: Family Medicine

## 2024-02-06 VITALS — BP 105/64 | HR 71 | Ht 67.0 in | Wt 192.0 lb

## 2024-02-06 DIAGNOSIS — F1010 Alcohol abuse, uncomplicated: Secondary | ICD-10-CM

## 2024-02-06 DIAGNOSIS — I8510 Secondary esophageal varices without bleeding: Secondary | ICD-10-CM

## 2024-02-06 DIAGNOSIS — M109 Gout, unspecified: Secondary | ICD-10-CM | POA: Diagnosis not present

## 2024-02-06 DIAGNOSIS — I10 Essential (primary) hypertension: Secondary | ICD-10-CM | POA: Diagnosis not present

## 2024-02-06 DIAGNOSIS — F411 Generalized anxiety disorder: Secondary | ICD-10-CM | POA: Diagnosis not present

## 2024-02-06 DIAGNOSIS — E782 Mixed hyperlipidemia: Secondary | ICD-10-CM

## 2024-02-06 LAB — CBC WITH DIFFERENTIAL/PLATELET
Basophils Absolute: 0.1 x10E3/uL (ref 0.0–0.2)
Basos: 1 %
EOS (ABSOLUTE): 0.2 x10E3/uL (ref 0.0–0.4)
Eos: 3 %
Hematocrit: 46.7 % (ref 37.5–51.0)
Hemoglobin: 15.4 g/dL (ref 13.0–17.7)
Immature Grans (Abs): 0 x10E3/uL (ref 0.0–0.1)
Immature Granulocytes: 0 %
Lymphocytes Absolute: 1.8 x10E3/uL (ref 0.7–3.1)
Lymphs: 37 %
MCH: 31.4 pg (ref 26.6–33.0)
MCHC: 33 g/dL (ref 31.5–35.7)
MCV: 95 fL (ref 79–97)
Monocytes Absolute: 0.4 x10E3/uL (ref 0.1–0.9)
Monocytes: 9 %
Neutrophils Absolute: 2.3 x10E3/uL (ref 1.4–7.0)
Neutrophils: 50 %
Platelets: 170 x10E3/uL (ref 150–450)
RBC: 4.9 x10E6/uL (ref 4.14–5.80)
RDW: 12.4 % (ref 11.6–15.4)
WBC: 4.7 x10E3/uL (ref 3.4–10.8)

## 2024-02-06 LAB — CMP14+EGFR
ALT: 17 IU/L (ref 0–44)
AST: 28 IU/L (ref 0–40)
Albumin: 4.6 g/dL (ref 3.8–4.9)
Alkaline Phosphatase: 77 IU/L (ref 44–121)
BUN/Creatinine Ratio: 17 (ref 9–20)
BUN: 12 mg/dL (ref 6–24)
Bilirubin Total: 0.4 mg/dL (ref 0.0–1.2)
CO2: 21 mmol/L (ref 20–29)
Calcium: 9.5 mg/dL (ref 8.7–10.2)
Chloride: 104 mmol/L (ref 96–106)
Creatinine, Ser: 0.72 mg/dL — ABNORMAL LOW (ref 0.76–1.27)
Globulin, Total: 2.7 g/dL (ref 1.5–4.5)
Glucose: 94 mg/dL (ref 70–99)
Potassium: 4.4 mmol/L (ref 3.5–5.2)
Sodium: 140 mmol/L (ref 134–144)
Total Protein: 7.3 g/dL (ref 6.0–8.5)
eGFR: 105 mL/min/1.73 (ref >59)

## 2024-02-06 LAB — URIC ACID: Uric Acid: 6.6 mg/dL (ref 3.8–8.4)

## 2024-02-06 LAB — LIPID PANEL WITH LDL/HDL RATIO
Cholesterol, Total: 153 mg/dL (ref 100–199)
HDL: 36 mg/dL — ABNORMAL LOW (ref >39)
LDL Chol Calc (NIH): 98 mg/dL (ref 0–99)
LDL/HDL Ratio: 2.7 ratio (ref 0.0–3.6)
Triglycerides: 104 mg/dL (ref 0–149)
VLDL Cholesterol Cal: 19 mg/dL (ref 5–40)

## 2024-02-06 LAB — PSA: Prostate Specific Ag, Serum: 0.3 ng/mL (ref 0.0–4.0)

## 2024-02-06 MED ORDER — BUSPIRONE HCL 15 MG PO TABS: ORAL_TABLET | ORAL | 6 refills | Status: AC

## 2024-02-06 NOTE — Assessment & Plan Note (Signed)
 Continue atorvastatin  at current strength. Lab Results  Component Value Date   LDLCALC 98 02/05/2024

## 2024-02-06 NOTE — Assessment & Plan Note (Signed)
 History of gout with hyperuricemia.  Doing well with allopurinol  at this time.

## 2024-02-06 NOTE — Assessment & Plan Note (Signed)
 Continue Coreg 

## 2024-02-06 NOTE — Progress Notes (Signed)
 [name] Brown - 58 y.o. male MRN [id number]  Date of birth: [birth year]  Subjective Chief Complaint  Patient presents with   Shoulder Pain    HPI [name] Brown is a 58 y.o. male here today for follow up visit.   Reports he is doing pretty well.  Feels a little sluggish since addition of carvedilol by his gastroenterologist due to varices.  Blood pressure is well-controlled.  He has remained sober from alcohol  since 2019.  Mood stable with Lexapro  at current strength.  He is using BuSpar  as needed as well.  This is working pretty well for him.  He denies side effects at current strength.    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  Recent uric acid levels were within normal limits.  ROS:  A comprehensive ROS was completed and negative except as noted per HPI   Past Medical History:  Diagnosis Date   Alcohol  abuse    and marijuana   Allergy    seasonal but not treated    Anxiety    Blood dyscrasia    thrombocytopenia   Cirrhosis of liver (HCC)    Depression    Enlarged prostate    Gout    History of hematuria    Hyperlipidemia    Hypertension    Nocturia    Right adrenal mass [facility])     Past Surgical History:  Procedure Laterality Date   CONDYLOMA EXCISION/FULGURATION N/A 03/15/2023   Procedure: GENITAL CONDYLOMA EXCISION;  Surgeon: Alvaro Ricardo KATHEE Mickey., MD;  Location: [facility];  Service: Urology;  Laterality: N/A;  REQUESTS 90 MINS TOTAL FOR MANNY 45 MINS FOR ALICIA THOMAS   EYE SURGERY Right 12-13 yrs ago   lasix eye surgery   INCISIONAL HERNIA REPAIR N/A 02/02/2016   Procedure: LAPAROSCOPIC INCISIONAL HERNIA;  Surgeon: Krystal Russell, MD;  Location: [facility];  Service: General;  Laterality: N/A;   INSERTION OF MESH N/A 02/02/2016   Procedure: INSERTION OF MESH;  Surgeon: Krystal Russell, MD;  Location: [facility];  Service: General;  Laterality: N/A;   KNEE SURGERY Left 1989   arthroscopy   LASER ABLATION CONDOLAMATA N/A 03/15/2023    Procedure: LASER ABLATION ANAL CONDYLOMA;  Surgeon: Debby Hila, MD;  Location: [facility];  Service: General;  Laterality: N/A;   nerve cautery     jaw bone    ROBOTIC ADRENALECTOMY Right 08/05/2015   Procedure: XI ROBOTIC RIGHT ADRENALECTOMY ;  Surgeon: Ricardo Alvaro, MD;  Location: [facility];  Service: Urology;  Laterality: Right;   WISDOM TOOTH EXTRACTION  1981    Social History   Socioeconomic History   Marital status: Single    Spouse name: Not on file   Number of children: Not on file   Years of education: Not on file   Highest education level: Not on file  Occupational History   Not on file  Tobacco Use   Smoking status: Never   Smokeless tobacco: Never  Vaping Use   Vaping status: Never Used  Substance and Sexual Activity   Alcohol  use: Never    Alcohol /week: 75.0 standard drinks of alcohol     Types: 60 Cans of beer, 15 Shots of liquor per week    Comment: 5 years alcohol  free   Drug use: Yes    Types: Marijuana    Comment: couple of times a month   Sexual activity: Yes    Partners: Female  Other Topics Concern   Not on file  Social History Narrative  Not on file   Social Drivers of Health   Financial Resource Strain: Low Risk  (02/06/2024)   Overall Financial Resource Strain (CARDIA)    Difficulty of Paying Living Expenses: Not very hard  Food Insecurity: No Food Insecurity (02/06/2024)   Hunger Vital Sign    Worried About Running Out of Food in the Last Year: Never true    Ran Out of Food in the Last Year: Never true  Transportation Needs: No Transportation Needs (02/06/2024)   PRAPARE - Administrator, Civil Service (Medical): No    Lack of Transportation (Non-Medical): No  Physical Activity: Inactive (02/06/2024)   Exercise Vital Sign    Days of Exercise per Week: 0 days    Minutes of Exercise per Session: 0 min  Stress: Stress Concern Present (02/06/2024)   [employer] of Occupational Health - Occupational Stress Questionnaire     Feeling of Stress: To some extent  Social Connections: Socially Isolated (02/06/2024)   Social Connection and Isolation Panel    Frequency of Communication with Friends and Family: Never    Frequency of Social Gatherings with Friends and Family: Never    Attends Religious Services: 1 to 4 times per year    Active Member of [employer] or Organizations: No    Attends Engineer, structural: Never    Marital Status: Never married    Family History  Problem Relation Age of Onset   Hyperlipidemia Father    Heart failure Father    Alcohol  abuse Maternal Grandfather    Colon cancer Neg Hx    Stomach cancer Neg Hx    Rectal cancer Neg Hx    Colon polyps Neg Hx    Esophageal cancer Neg Hx     Health Maintenance  Topic Date Due   Hepatitis B Vaccines 19-59 Average Risk (1 of 3 - 19+ 3-dose series) Never done   COVID-19 Vaccine (3 - Pfizer risk series) 11/14/2019   Colonoscopy  04/16/2020   INFLUENZA VACCINE  01/19/2024   DTaP/Tdap/Td (2 - Td or Tdap) 01/05/2025   Pneumococcal Vaccine: 50+ Years  Completed   Hepatitis C Screening  Completed   HIV Screening  Completed   Zoster Vaccines- Shingrix  Completed   HPV VACCINES  Aged Out   Meningococcal B Vaccine  Aged Out   Pneumococcal Vaccine  Discontinued     ----------------------------------------------------------------------------------------------------------------------------------------------------------------------------------------------------------------- Physical Exam BP 105/64 (BP Location: Left Arm, Patient Position: Sitting, Cuff Size: Normal)   Pulse 71   Ht 5' 7 (1.702 m)   Wt 192 lb (87.1 kg)   SpO2 98%   BMI 30.07 kg/m   Physical Exam Constitutional:      Appearance: Normal appearance.  HENT:     Head: Normocephalic and atraumatic.  Cardiovascular:     Rate and Rhythm: Normal rate and regular rhythm.  Pulmonary:     Effort: Pulmonary effort is normal.     Breath sounds: Normal breath sounds.   Musculoskeletal:     Cervical back: Neck supple.  Neurological:     General: No focal deficit present.     Mental Status: He is alert.  Psychiatric:        Mood and Affect: Mood normal.        Behavior: Behavior normal.     ------------------------------------------------------------------------------------------------------------------------------------------------------------------------------------------------------------------- Assessment and Plan  Anxiety state He did see a therapist for a while which was helpful.  Continues on Lexapro  with BuSpar .  Stable at this time.  Alcohol  abuse Prior history  of alcohol  abuse, has been able to remain abstinent for several years.  Gout History of gout with hyperuricemia.  Doing well with allopurinol  at this time.  Essential hypertension Blood pressure is well-controlled at this time.  He is on carvedilol for prevention of variceal bleeding and portal hypertension.  Esophageal varices without bleeding (HCC) Continue Coreg.  Hyperlipidemia Continue atorvastatin  at current strength. Lab Results  Component Value Date   LDLCALC 98 02/05/2024     Meds ordered this encounter  Medications   busPIRone  (BUSPAR ) 15 MG tablet    Sig: TAKE 1 TABLET(15 MG) BY MOUTH TWICE DAILY.    Dispense:  60 tablet    Refill:  6    Return in about 6 months (around 08/08/2024) for Hypertension, Mood/BH.

## 2024-02-06 NOTE — Assessment & Plan Note (Signed)
He did see a therapist for a while which was helpful.  Continues on Lexapro with BuSpar.  Stable at this time.

## 2024-02-06 NOTE — Assessment & Plan Note (Signed)
 Blood pressure is well-controlled at this time.  He is on carvedilol for prevention of variceal bleeding and portal hypertension.

## 2024-02-06 NOTE — Assessment & Plan Note (Signed)
Prior history of alcohol abuse, has been able to remain abstinent for several years.

## 2024-02-21 ENCOUNTER — Other Ambulatory Visit: Payer: Self-pay | Admitting: Family Medicine

## 2024-02-21 DIAGNOSIS — M109 Gout, unspecified: Secondary | ICD-10-CM

## 2024-03-07 ENCOUNTER — Telehealth: Payer: Self-pay

## 2024-03-07 MED ORDER — ESCITALOPRAM OXALATE 20 MG PO TABS: 20.0000 mg | ORAL_TABLET | Freq: Every day | ORAL | 1 refills | Status: DC

## 2024-03-07 NOTE — Telephone Encounter (Signed)
 Refill sent per protocol  Patient informed via Mychart.

## 2024-03-07 NOTE — Telephone Encounter (Signed)
 Copied from CRM [id number]. Topic: Clinical - Prescription Issue >> Mar 07, 2024 11:56 AM Cherylann RAMAN wrote: Reason for CRM: Patient called in and stated that the medication for escitalopram  (LEXAPRO ) 20 MG tablet has been sent over to the provider's office for refill but the pharmacy state they have not heard a response from the office after three attempts. Please contact patient at [phone] for additional information. In addition, patient has taken his last pill today 09/18.

## 2024-06-12 ENCOUNTER — Other Ambulatory Visit: Payer: Self-pay | Admitting: Family Medicine

## 2024-08-08 ENCOUNTER — Ambulatory Visit: Admitting: Family Medicine
